# Patient Record
Sex: Female | Born: 1975
Health system: Southern US, Community
[De-identification: ages and names within clinical notes are randomized; demographics above are authoritative.]

## PROBLEM LIST (undated history)

## (undated) DIAGNOSIS — J189 Pneumonia, unspecified organism: Secondary | ICD-10-CM

## (undated) DIAGNOSIS — L709 Acne, unspecified: Secondary | ICD-10-CM

## (undated) DIAGNOSIS — R7303 Prediabetes: Secondary | ICD-10-CM

## (undated) DIAGNOSIS — I499 Cardiac arrhythmia, unspecified: Secondary | ICD-10-CM

## (undated) DIAGNOSIS — F419 Anxiety disorder, unspecified: Secondary | ICD-10-CM

## (undated) DIAGNOSIS — E785 Hyperlipidemia, unspecified: Secondary | ICD-10-CM

## (undated) DIAGNOSIS — J302 Other seasonal allergic rhinitis: Secondary | ICD-10-CM

## (undated) DIAGNOSIS — K219 Gastro-esophageal reflux disease without esophagitis: Secondary | ICD-10-CM

## (undated) DIAGNOSIS — R0600 Dyspnea, unspecified: Secondary | ICD-10-CM

## (undated) DIAGNOSIS — K921 Melena: Secondary | ICD-10-CM

## (undated) HISTORY — PX: CYST REMOVAL TRUNK: SHX6283

## (undated) HISTORY — DX: Gastro-esophageal reflux disease without esophagitis: K21.9

## (undated) HISTORY — PX: HEMORROIDECTOMY: SUR656

## (undated) HISTORY — PX: TONSILLECTOMY: SUR1361

## (undated) HISTORY — DX: Hyperlipidemia, unspecified: E78.5

## (undated) HISTORY — DX: Acne, unspecified: L70.9

## (undated) HISTORY — DX: Melena: K92.1

## (undated) HISTORY — DX: Other seasonal allergic rhinitis: J30.2

## (undated) HISTORY — PX: CHOLECYSTECTOMY: SHX55

---

## 2005-11-05 ENCOUNTER — Ambulatory Visit: Payer: Self-pay | Admitting: Internal Medicine

## 2005-11-12 ENCOUNTER — Ambulatory Visit: Payer: Self-pay | Admitting: Internal Medicine

## 2006-03-30 ENCOUNTER — Ambulatory Visit: Payer: Self-pay | Admitting: Internal Medicine

## 2006-04-03 ENCOUNTER — Ambulatory Visit: Payer: Self-pay | Admitting: Internal Medicine

## 2006-04-03 LAB — CONVERTED CEMR LAB
Albumin: 3.5 g/dL (ref 3.5–5.2)
Basophils Absolute: 0 10*3/uL (ref 0.0–0.1)
CO2: 25 meq/L (ref 19–32)
Chol/HDL Ratio, serum: 2.9
Cholesterol: 126 mg/dL (ref 0–200)
Creatinine, Ser: 0.8 mg/dL (ref 0.4–1.2)
Eosinophil percent: 3.5 % (ref 0.0–5.0)
Glomerular Filtration Rate, Af Am: 108 mL/min/{1.73_m2}
HCT: 35.8 % — ABNORMAL LOW (ref 36.0–46.0)
Hemoglobin: 12.1 g/dL (ref 12.0–15.0)
MCV: 85.8 fL (ref 78.0–100.0)
Monocytes Absolute: 0.4 10*3/uL (ref 0.2–0.7)
Neutrophils Relative %: 41 % — ABNORMAL LOW (ref 43.0–77.0)
Potassium: 3.9 meq/L (ref 3.5–5.1)
RBC: 4.18 M/uL (ref 3.87–5.11)
Rheumatoid Fact: <20 intl units/mL — ABNORMAL LOW (ref 0.0–20.0)
Sodium: 139 meq/L (ref 135–145)
TSH: 2.14 microintl units/mL (ref 0.35–5.50)
Total Bilirubin: 0.7 mg/dL (ref 0.3–1.2)
Total Protein: 7.1 g/dL (ref 6.0–8.3)
WBC: 6 10*3/uL (ref 4.5–10.5)

## 2006-04-29 ENCOUNTER — Ambulatory Visit: Payer: Self-pay | Admitting: Internal Medicine

## 2006-11-16 ENCOUNTER — Telehealth: Payer: Self-pay | Admitting: Internal Medicine

## 2006-11-25 ENCOUNTER — Telehealth (INDEPENDENT_AMBULATORY_CARE_PROVIDER_SITE_OTHER): Payer: Self-pay | Admitting: *Deleted

## 2006-12-15 DIAGNOSIS — K219 Gastro-esophageal reflux disease without esophagitis: Secondary | ICD-10-CM | POA: Insufficient documentation

## 2006-12-15 DIAGNOSIS — J45909 Unspecified asthma, uncomplicated: Secondary | ICD-10-CM | POA: Insufficient documentation

## 2006-12-15 DIAGNOSIS — J309 Allergic rhinitis, unspecified: Secondary | ICD-10-CM

## 2006-12-15 DIAGNOSIS — E785 Hyperlipidemia, unspecified: Secondary | ICD-10-CM

## 2007-05-19 ENCOUNTER — Ambulatory Visit: Payer: Self-pay | Admitting: Internal Medicine

## 2007-05-20 ENCOUNTER — Encounter: Payer: Self-pay | Admitting: Internal Medicine

## 2007-05-31 ENCOUNTER — Telehealth (INDEPENDENT_AMBULATORY_CARE_PROVIDER_SITE_OTHER): Payer: Self-pay | Admitting: *Deleted

## 2007-06-03 ENCOUNTER — Telehealth (INDEPENDENT_AMBULATORY_CARE_PROVIDER_SITE_OTHER): Payer: Self-pay | Admitting: *Deleted

## 2007-06-10 ENCOUNTER — Ambulatory Visit: Payer: Self-pay | Admitting: Internal Medicine

## 2007-06-21 ENCOUNTER — Telehealth: Payer: Self-pay | Admitting: Internal Medicine

## 2007-06-24 ENCOUNTER — Encounter: Payer: Self-pay | Admitting: Internal Medicine

## 2007-07-15 ENCOUNTER — Ambulatory Visit: Payer: Self-pay | Admitting: Internal Medicine

## 2007-07-15 DIAGNOSIS — K5289 Other specified noninfective gastroenteritis and colitis: Secondary | ICD-10-CM

## 2008-03-16 ENCOUNTER — Telehealth: Payer: Self-pay | Admitting: Internal Medicine

## 2008-04-24 ENCOUNTER — Encounter (INDEPENDENT_AMBULATORY_CARE_PROVIDER_SITE_OTHER): Payer: Self-pay | Admitting: *Deleted

## 2008-04-24 ENCOUNTER — Ambulatory Visit: Payer: Self-pay | Admitting: Family Medicine

## 2008-06-16 ENCOUNTER — Telehealth: Payer: Self-pay | Admitting: Internal Medicine

## 2008-08-14 ENCOUNTER — Encounter: Payer: Self-pay | Admitting: Family Medicine

## 2008-11-21 ENCOUNTER — Ambulatory Visit: Payer: Self-pay | Admitting: Family Medicine

## 2008-11-28 ENCOUNTER — Ambulatory Visit: Payer: Self-pay | Admitting: Family Medicine

## 2008-11-28 LAB — CONVERTED CEMR LAB
ALT: 14 units/L (ref 0–35)
AST: 19 units/L (ref 0–37)
Albumin: 3.4 g/dL — ABNORMAL LOW (ref 3.5–5.2)
Alkaline Phosphatase: 63 units/L (ref 39–117)
Basophils Absolute: 0 10*3/uL (ref 0.0–0.1)
CO2: 27 meq/L (ref 19–32)
Chloride: 109 meq/L (ref 96–112)
Glucose, Bld: 89 mg/dL (ref 70–99)
HCT: 36.9 % (ref 36.0–46.0)
Lymphocytes Relative: 45.6 % (ref 12.0–46.0)
Lymphs Abs: 4.1 10*3/uL — ABNORMAL HIGH (ref 0.7–4.0)
Monocytes Relative: 5.7 % (ref 3.0–12.0)
Platelets: 228 10*3/uL (ref 150.0–400.0)
Potassium: 4.3 meq/L (ref 3.5–5.1)
RDW: 12.3 % (ref 11.5–14.6)
Sodium: 143 meq/L (ref 135–145)
Total Protein: 7 g/dL (ref 6.0–8.3)

## 2008-11-29 ENCOUNTER — Telehealth (INDEPENDENT_AMBULATORY_CARE_PROVIDER_SITE_OTHER): Payer: Self-pay

## 2008-11-29 ENCOUNTER — Telehealth: Payer: Self-pay | Admitting: Family Medicine

## 2008-11-30 ENCOUNTER — Encounter: Payer: Self-pay | Admitting: Family Medicine

## 2008-12-04 LAB — CONVERTED CEMR LAB: Free T4: 0.9 ng/dL (ref 0.6–1.6)

## 2008-12-20 ENCOUNTER — Encounter: Payer: Self-pay | Admitting: Internal Medicine

## 2009-02-15 ENCOUNTER — Telehealth: Payer: Self-pay | Admitting: Family Medicine

## 2009-02-19 ENCOUNTER — Encounter (INDEPENDENT_AMBULATORY_CARE_PROVIDER_SITE_OTHER): Payer: Self-pay | Admitting: *Deleted

## 2009-04-26 ENCOUNTER — Telehealth: Payer: Self-pay | Admitting: Family Medicine

## 2009-10-01 ENCOUNTER — Telehealth: Payer: Self-pay | Admitting: Family Medicine

## 2009-10-29 ENCOUNTER — Telehealth: Payer: Self-pay | Admitting: Family Medicine

## 2009-11-07 ENCOUNTER — Ambulatory Visit: Payer: Self-pay | Admitting: Family Medicine

## 2009-11-08 ENCOUNTER — Ambulatory Visit: Payer: Self-pay | Admitting: Family Medicine

## 2009-11-08 ENCOUNTER — Telehealth (INDEPENDENT_AMBULATORY_CARE_PROVIDER_SITE_OTHER): Payer: Self-pay | Admitting: *Deleted

## 2009-11-09 ENCOUNTER — Telehealth: Payer: Self-pay | Admitting: Family Medicine

## 2010-02-14 ENCOUNTER — Ambulatory Visit: Payer: Self-pay | Admitting: Family Medicine

## 2010-02-14 LAB — CONVERTED CEMR LAB
AST: 29 units/L (ref 0–37)
Albumin: 3.5 g/dL (ref 3.5–5.2)
Alkaline Phosphatase: 99 units/L (ref 39–117)
Cholesterol: 146 mg/dL (ref 0–200)
Total CHOL/HDL Ratio: 2

## 2010-02-28 ENCOUNTER — Ambulatory Visit: Payer: Self-pay | Admitting: Family Medicine

## 2010-03-14 ENCOUNTER — Ambulatory Visit: Payer: Self-pay | Admitting: Internal Medicine

## 2010-03-14 ENCOUNTER — Telehealth: Payer: Self-pay | Admitting: Family Medicine

## 2010-03-14 DIAGNOSIS — J029 Acute pharyngitis, unspecified: Secondary | ICD-10-CM

## 2010-05-14 NOTE — Progress Notes (Signed)
  Phone Note Other Incoming   Caller: Mills Koller Summary of Call: FYI patient refused to let us draw blood, I tried once unsuccessfully, she came back today and let tasha try. She is has a great deal of anxiety about this. I sent her to [place] to the [employer]. Initial call taken by: Mills Koller,  November 08, 2009 8:53 AM  Follow-up for Phone Call        thank you. Ruthe Mannan MD  November 08, 2009 8:58 AM

## 2010-05-14 NOTE — Progress Notes (Signed)
Summary: Additional instructions  ---- Converted from flag ---- ---- 03/14/2010 12:03 PM, Tricia Boyden  MD wrote: can we call and remind abx will decrease effectiveness of OCP's i forgot to tell her. thanks. ------------------------------  Left detailed message on cell phone advising to use additional form of contraception while on abx. Instructed to call with questions or concerns. Kim Dance CMA Duncan Dull)  March 14, 2010 1:59 PM

## 2010-05-14 NOTE — Letter (Signed)
Summary: Generic Letter  [place] [facility]  [address] [place], [place] [address]   Phone: [phone]  Fax: [phone]    02/14/2010  [facility] [address] [place], [place]  [address]  Dear Ms. Beattie,    We have received your lab results and Dr. Dayton Martes says your LDL (bad cholesterol) is great, HDL (good cholesterol) is great as well.  Triglycerides still elevated.  Decrease added sugars, eliminate trans fats, increase fiber and limit alcohol.  All these changes together can drop triglycerides by almost 50%.  Liver function normal.       Sincerely,      Dr. Ruthe Mannan

## 2010-05-14 NOTE — Progress Notes (Signed)
Summary: prior Berkley Harvey is needed for singulair  Phone Note From Pharmacy   [facility]  [address] [id number] [employer] Summary of Call: Prior Berkley Harvey is needed for singulair, form is on your desk. Initial call taken by: Lowella Petties CMA,  October 01, 2009 3:28 PM

## 2010-05-14 NOTE — Assessment & Plan Note (Signed)
Summary: ST,COUGH,CONGESTION,ASTHMA/CLE   Vital Signs:  Patient profile:   35 year old female Weight:      258 pounds O2 Sat:      99 % on Room air Temp:     99.6 degrees F oral Pulse rate:   110 / minute Pulse rhythm:   regular BP sitting:   124 / 82  (left arm) Cuff size:   large  Vitals Entered By: Selena Batten Dance CMA (AAMA) (March 14, 2010 9:25 AM)  O2 Flow:  Room air CC: Sore throat/Cough/Congestion   History of Present Illness: CC: ST/congestion  1d h/o ST, sinus drainage, pain with swallowing.  Noticed uvula really swollen.  + congestion.  Has tried tylenol cold and alleve.  101 degree fever yesterday.  h/o asthma.  h/o allergies.  No cough, abd pain, n/v, rashes, myalgias, arthralgias.  Husband and son sick recently.  + sister with strep last week.  no smokers at home.  Current Medications (verified): 1)  Singulair 10 Mg  Tabs (Montelukast Sodium) .... One By Mouth At Bedtime 2)  Lipitor 20 Mg Tabs (Atorvastatin Calcium) .Marland Kitchen.. 1 By Mouth Once Daily 3)  Proventil Hfa 108 (90 Base) Mcg/act  Aers (Albuterol Sulfate) .... 2 Puffs Every 4 Hours As Needed 4)  Albuterol Sulfate (2.5 Mg/54ml) 0.083% Nebu (Albuterol Sulfate) .Marland Kitchen.. 1 Neb Q 4 Hours As Needed Wheezing 5)  Yasmin 28 3-0.03 Mg  Tabs (Drospirenone-Ethinyl Estradiol) .Marland Kitchen.. 1 Once Daily 6)  Zyrtec Allergy 10 Mg Tabs (Cetirizine Hcl) .... Take One Tablet Once Daily 7)  Tagamet .... As Needed 8)  Prilosec .... As Needed 9)  Flinstones Vitamin With Iron .... Take One Tablet Once Daily 10)  Nasonex 50 Mcg/act Susp (Mometasone Furoate) .... 2 Sprays Each Nostril Daily 11)  Align  Caps (Probiotic Product) .... Take One Tablet By Mouth Daily 12)  Phillips Stool Softener 100 Mg Caps (Docusate Sodium) .... Take Two Tablets At Bedtime 13)  Flovent Hfa 220 Mcg/act Aero (Fluticasone Propionate  Hfa) .... 2 Puffs Two Times A Day 14)  Dulera 100-5 Mcg/act Aero (Mometasone Furo-Formoterol Fum) .... 2 Inhalations Two Times A Day 15)   Valium 2 Mg Tabs (Diazepam) .Marland Kitchen.. 1-2 Tabs 30 Min Prior To Procedure  Allergies: 1)  ! Sulfa 2)  Sulfamethoxazole (Sulfamethoxazole)  Past History:  Past Medical History: Last updated: 04/24/2008 Acne Allergic rhinitis Asthma Hyperlipidemia Blood in Stool Chronic Bronchitis Allergies GERD  Social History: Last updated: 12/15/2006 Occupation: Married Never Smoked Alcohol use-no Drug use-no Regular exercise-no  Review of Systems       per HPI  Physical Exam  General:  alert and overweight-appearing. nontoxic, + congested  Head:  Normocephalic and atraumatic without obvious abnormalities. No apparent alopecia or balding.  NT sinuses Eyes:  No corneal or conjunctival inflammation noted. EOMI. Perrla.  Ears:  fluid behind bilateral TMs, L side somewhat erythematous Nose:  nares clear Mouth:  MMM, + significant pharyngeal erythema, some exudates, + uvula erythematous and slightly swollen Neck:  R AC LAD Lungs:  normal respiratory effort, no intercostal retractions, no accessory muscle use, and no wheezes.   Heart:  normal rate and regular rhythm.   Pulses:  2+ rad pulses Extremities:  no pedal edema Skin:  Intact without suspicious lesions or rashes   Impression & Recommendations:  Problem # 1:  ACUTE PHARYNGITIS (ICD-462)  3-4/4 centor criteria.  rapid strep neg.  given swollen uvula, treat with abx.  lungs clear today.  return for red flags.  Orders:  Rapid Strep (32202)  Her updated medication list for this problem includes:    Amoxicillin 500 Mg Caps (Amoxicillin) .Marland Kitchen... Take one twice daily for next 10 days  Complete Medication List: 1)  Singulair 10 Mg Tabs (Montelukast sodium) .... One by mouth at bedtime 2)  Lipitor 20 Mg Tabs (Atorvastatin calcium) .Marland Kitchen.. 1 by mouth once daily 3)  Proventil Hfa 108 (90 Base) Mcg/act Aers (Albuterol sulfate) .... 2 puffs every 4 hours as needed 4)  Albuterol Sulfate (2.5 Mg/47ml) 0.083% Nebu (Albuterol sulfate) .Marland Kitchen.. 1 neb q  4 hours as needed wheezing 5)  Yasmin 28 3-0.03 Mg Tabs (Drospirenone-ethinyl estradiol) .Marland Kitchen.. 1 once daily 6)  Zyrtec Allergy 10 Mg Tabs (Cetirizine hcl) .... Take one tablet once daily 7)  Tagamet  .... As needed 8)  Prilosec  .... As needed 9)  Flinstones Vitamin With Iron  .... Take one tablet once daily 10)  Nasonex 50 Mcg/act Susp (Mometasone furoate) .... 2 sprays each nostril daily 11)  Align Caps (Probiotic product) .... Take one tablet by mouth daily 12)  Phillips Stool Softener 100 Mg Caps (Docusate sodium) .... Take two tablets at bedtime 13)  Flovent Hfa 220 Mcg/act Aero (Fluticasone propionate  hfa) .... 2 puffs two times a day 14)  Dulera 100-5 Mcg/act Aero (Mometasone furo-formoterol fum) .... 2 inhalations two times a day 15)  Valium 2 Mg Tabs (Diazepam) .Marland Kitchen.. 1-2 tabs 30 min prior to procedure 16)  Amoxicillin 500 Mg Caps (Amoxicillin) .... Take one twice daily for next 10 days  Patient Instructions: 1)  could be viral, could be strep.  Treat with amoxicillin twice daily for next 10 days. 2)  Wash hands to prevent spreading. 3)  Push fluids, get plenty of rest, ibuprofen up to 600mg  three times a day with food (motrin) for sore throat.  Suck on cold things like popsicles, or heat to soothe throat like herbal teas, consider salt water gargles. 4)  If fever >101.5, trouble swallowing or breathing or opening mouth, drooling, not improving as expected, or other concerns, you may need to return to be seen. 5)  Pleasure to see you today, call clinic with questions.  Prescriptions: AMOXICILLIN 500 MG CAPS (AMOXICILLIN) take one twice daily for next 10 days  #20 x 0   Entered and Authorized by:   Eustaquio Boyden  MD   Signed by:   Eustaquio Boyden  MD on 03/14/2010   Method used:   [facility]        [facility]  [address] [id number]       [address]       [place], [place]  [address]       Ph: [phone] or [phone]       Fax: [phone]   RxID:    [id number]    Orders Added: 1)  Rapid Strep [67893] 2)  Est. Patient Level III [81017]    Current Allergies (reviewed today): ! SULFA SULFAMETHOXAZOLE (SULFAMETHOXAZOLE)  Laboratory Results    Other Tests  Rapid Strep: negative

## 2010-05-14 NOTE — Assessment & Plan Note (Signed)
Summary: F/U LABWORK/CLE   Vital Signs:  Patient profile:   35 year old female Height:      63 inches Weight:      254 pounds BMI:     45.16 Temp:     99.1 degrees F oral Pulse rate:   80 / minute Pulse rhythm:   regular BP sitting:   128 / 82  (right arm) Cuff size:   large  Vitals Entered By: Linde Gillis CMA Duncan Dull) (February 28, 2010 8:06 AM) CC: follow up after labs   History of Present Illness: 35 year old female here to follow up labs.  Asthma and allergies: not well controlled.  Flovent did not work for her.  Wants to try something else.    Hyperlipidemia- had been on  Lipitor 20 mg and Tricor 145 mg and has been for years.  When first started on Tricor her TG were above 1000 but was on accutance at the time.  Last lipid panel was in August and very good.  Father had MI in his 18s.  Tricor is extremely expensive so she wanted to stop taking it. Lipid panel this month showed TG increased to 256, HDL 64, and LDL 50.  Working on diet.        Current Medications (verified): 1)  Singulair 10 Mg  Tabs (Montelukast Sodium) .... One By Mouth At Bedtime 2)  Lipitor 20 Mg Tabs (Atorvastatin Calcium) .Marland Kitchen.. 1 By Mouth Once Daily 3)  Proventil Hfa 108 (90 Base) Mcg/act  Aers (Albuterol Sulfate) .... 2 Puffs Every 4 Hours As Needed 4)  Albuterol Sulfate (2.5 Mg/68ml) 0.083% Nebu (Albuterol Sulfate) .Marland Kitchen.. 1 Neb Q 4 Hours As Needed Wheezing 5)  Yasmin 28 3-0.03 Mg  Tabs (Drospirenone-Ethinyl Estradiol) .Marland Kitchen.. 1 Once Daily 6)  Zyrtec Allergy 10 Mg Tabs (Cetirizine Hcl) .... Take One Tablet Once Daily 7)  Tagamet .... As Needed 8)  Prilosec .... As Needed 9)  Flinstones Vitamin With Iron .... Take One Tablet Once Daily 10)  Nasonex 50 Mcg/act Susp (Mometasone Furoate) .... 2 Sprays Each Nostril Daily 11)  Align  Caps (Probiotic Product) .... Take One Tablet By Mouth Daily 12)  Phillips Stool Softener 100 Mg Caps (Docusate Sodium) .... Take Two Tablets At Bedtime 13)  Flovent Hfa 220  Mcg/act Aero (Fluticasone Propionate  Hfa) .... 2 Puffs Two Times A Day 14)  Dulera 100-5 Mcg/act Aero (Mometasone Furo-Formoterol Fum) .... 2 Inhalations Two Times A Day 15)  Valium 2 Mg Tabs (Diazepam) .Marland Kitchen.. 1-2 Tabs 30 Min Prior To Procedure  Allergies: 1)  ! Sulfa 2)  Sulfamethoxazole (Sulfamethoxazole)  Past History:  Past Medical History: Last updated: 04/24/2008 Acne Allergic rhinitis Asthma Hyperlipidemia Blood in Stool Chronic Bronchitis Allergies GERD  Past Surgical History: Last updated: 12/15/2006 Cholecystectomy Tonsillectomy Hemorrhoidectomy  Family History: Last updated: 12/15/2006 Family History of Arthritis Family History Diabetes 1st degree relative Family History Hypertension Family History of Stroke M 1st degree relative <50 Family History of Cardiovascular disorder  Social History: Last updated: 12/15/2006 Occupation: Married Never Smoked Alcohol use-no Drug use-no Regular exercise-no  Risk Factors: Exercise: no (12/15/2006)  Risk Factors: Smoking Status: never (12/15/2006)  Review of Systems      See HPI General:  Denies chills and fever. CV:  Denies chest pain or discomfort. Resp:  Denies shortness of breath. MS:  Denies muscle aches and muscle weakness.  Physical Exam  General:  alert and overweight-appearing.   Lungs:  normal respiratory effort, no intercostal retractions, no accessory  muscle use, and no wheezes.   Heart:  normal rate and regular rhythm.   Psych:  Cognition and judgment appear intact. Alert and cooperative with normal attention span and concentration. No apparent delusions, illusions, hallucinations   Impression & Recommendations:  Problem # 1:  HYPERLIPIDEMIA (ICD-272.4) Assessment Deteriorated Will continue lipitor but we needed to monitor her TG very closely.   She needs to continue diet: Decreased added sugars, eliminate trans fats, increase fiber and limit alcohol.  All these changes together can drop  triglycerides by almost 50%.  Her updated medication list for this problem includes:    Lipitor 20 Mg Tabs (Atorvastatin calcium) .Marland Kitchen... 1 by mouth once daily  Problem # 2:  ASTHMA (ICD-493.90) Assessment: Unchanged Will try Dulera.  Her updated medication list for this problem includes:    Singulair 10 Mg Tabs (Montelukast sodium) ..... One by mouth at bedtime    Proventil Hfa 108 (90 Base) Mcg/act Aers (Albuterol sulfate) .Marland Kitchen... 2 puffs every 4 hours as needed    Albuterol Sulfate (2.5 Mg/2ml) 0.083% Nebu (Albuterol sulfate) .Marland Kitchen... 1 neb q 4 hours as needed wheezing    Flovent Hfa 220 Mcg/act Aero (Fluticasone propionate  hfa) .Marland Kitchen... 2 puffs two times a day    Dulera 100-5 Mcg/act Aero (Mometasone furo-formoterol fum) .Marland Kitchen... 2 inhalations two times a day  Complete Medication List: 1)  Singulair 10 Mg Tabs (Montelukast sodium) .... One by mouth at bedtime 2)  Lipitor 20 Mg Tabs (Atorvastatin calcium) .Marland Kitchen.. 1 by mouth once daily 3)  Proventil Hfa 108 (90 Base) Mcg/act Aers (Albuterol sulfate) .... 2 puffs every 4 hours as needed 4)  Albuterol Sulfate (2.5 Mg/75ml) 0.083% Nebu (Albuterol sulfate) .Marland Kitchen.. 1 neb q 4 hours as needed wheezing 5)  Yasmin 28 3-0.03 Mg Tabs (Drospirenone-ethinyl estradiol) .Marland Kitchen.. 1 once daily 6)  Zyrtec Allergy 10 Mg Tabs (Cetirizine hcl) .... Take one tablet once daily 7)  Tagamet  .... As needed 8)  Prilosec  .... As needed 9)  Flinstones Vitamin With Iron  .... Take one tablet once daily 10)  Nasonex 50 Mcg/act Susp (Mometasone furoate) .... 2 sprays each nostril daily 11)  Align Caps (Probiotic product) .... Take one tablet by mouth daily 12)  Phillips Stool Softener 100 Mg Caps (Docusate sodium) .... Take two tablets at bedtime 13)  Flovent Hfa 220 Mcg/act Aero (Fluticasone propionate  hfa) .... 2 puffs two times a day 14)  Dulera 100-5 Mcg/act Aero (Mometasone furo-formoterol fum) .... 2 inhalations two times a day 15)  Valium 2 Mg Tabs (Diazepam) .Marland Kitchen.. 1-2 tabs 30  min prior to procedure  Patient Instructions: 1)  Please come back to see me in 3 months for lab work. Prescriptions: VALIUM 2 MG TABS (DIAZEPAM) 1-2 tabs 30 min prior to procedure  #20 x 0   Entered and Authorized by:   Ruthe Mannan MD   Signed by:   Ruthe Mannan MD on 02/28/2010   Method used:   Print then Give to Patient   RxID:   [id number]    Orders Added: 1)  Est. Patient Level IV [14782]    Current Allergies (reviewed today): ! SULFA SULFAMETHOXAZOLE (SULFAMETHOXAZOLE)

## 2010-05-14 NOTE — Assessment & Plan Note (Signed)
Summary: XFER FROM DR COPLAND-   Vital Signs:  Patient profile:   35 year old female Height:      63 inches Weight:      249.38 pounds BMI:     44.34 Temp:     98.7 degrees F oral Pulse rate:   88 / minute Pulse rhythm:   regular BP sitting:   120 / 82  (right arm) Cuff size:   large  Vitals Entered By: Linde Gillis CMA [facility]) (November 07, 2009 8:10 AM) CC:  transfer from Dr. Patsy Lager   History of Present Illness: 35 year old female here to establish care with me and discuss changing her medications.  Asthma and allergies: not well controlled.  Using her nebulizer daily and not using her Qvar because it is too expensive.  Has issues with temp and humidity changes which is much worse since she moved here from [place].  Hyperlipidemia- on Lipitor 20 mg and Tricor 145 mg and has been for years.  When first started on Tricor her TG were above 1000 but was on accutance at the time.  Last lipid panel was in August and very good.  Father had MI in his 9s.  Tricor is extremely expensive.      Current Medications (verified): 1)  Singulair 10 Mg  Tabs (Montelukast Sodium) .... One By Mouth At Bedtime 2)  Lipitor 20 Mg Tabs (Atorvastatin Calcium) .Marland Kitchen.. 1 By Mouth Once Daily 3)  Proventil Hfa 108 (90 Base) Mcg/act  Aers (Albuterol Sulfate) .... 2 Puffs Every 4 Hours As Needed 4)  Albuterol Sulfate (2.5 Mg/41ml) 0.083% Nebu (Albuterol Sulfate) .Marland Kitchen.. 1 Neb Q 4 Hours As Needed Wheezing 5)  Yasmin 28 3-0.03 Mg  Tabs (Drospirenone-Ethinyl Estradiol) .Marland Kitchen.. 1 Once Daily 6)  Zyrtec Allergy 10 Mg Tabs (Cetirizine Hcl) .... Take One Tablet Once Daily 7)  Tagamet .... As Needed 8)  Prilosec .... As Needed 9)  Flinstones Vitamin With Iron .... Take One Tablet Once Daily 10)  Nasonex 50 Mcg/act Susp (Mometasone Furoate) .... 2 Sprays Each Nostril Daily 11)  Align  Caps (Probiotic Product) .... Take One Tablet By Mouth Daily 12)  Phillips Stool Softener 100 Mg Caps (Docusate Sodium) .... Take Two Tablets  At Bedtime 13)  Flovent Hfa 220 Mcg/act Aero (Fluticasone Propionate  Hfa) .... 2 Puffs Two Times A Day  Allergies: 1)  ! Sulfa 2)  Sulfamethoxazole (Sulfamethoxazole)  Past History:  Past Medical History: Last updated: 04/24/2008 Acne Allergic rhinitis Asthma Hyperlipidemia Blood in Stool Chronic Bronchitis Allergies GERD  Past Surgical History: Last updated: 12/15/2006 Cholecystectomy Tonsillectomy Hemorrhoidectomy  Family History: Last updated: 12/15/2006 Family History of Arthritis Family History Diabetes 1st degree relative Family History Hypertension Family History of Stroke M 1st degree relative <50 Family History of Cardiovascular disorder  Social History: Last updated: 12/15/2006 Occupation: Married Never Smoked Alcohol use-no Drug use-no Regular exercise-no  Risk Factors: Exercise: no (12/15/2006)  Risk Factors: Smoking Status: never (12/15/2006)  Review of Systems      See HPI  Physical Exam  General:  alert and overweight-appearing.   Psych:  Cognition and judgment appear intact. Alert and cooperative with normal attention span and concentration. No apparent delusions, illusions, hallucinations   Impression & Recommendations:  Problem # 1:  HYPERLIPIDEMIA (ICD-272.4) Assessment Unchanged Time spent with patient 25 minutes, more than 50% of this time was spent counseling patient on HLD and asthma management. Will recheck FLP today and plan to hold Tricor.  Discussed dietary changes that can  improve TG (see pt instructions).  Needs cardiac protection from statin, so continue Lipitor.  We can always increase lipitor dose or change to Crestor if lipids increase at next office visit (1-2 months).  The following medications were removed from the medication list:    Tricor 145 Mg Tabs (Fenofibrate) .Marland Kitchen... Take one tablet daily Her updated medication list for this problem includes:    Lipitor 20 Mg Tabs (Atorvastatin calcium) .Marland Kitchen... 1 by mouth  once daily  Orders: TLB-Lipid Panel ([id number]-[id number])  Problem # 2:  ASTHMA (ICD-493.90) Assessment: Deteriorated Needs a daily inhaled steroid at this point.  Will try Flovent as it is cheaper than Qvar so will hopefully improve patient compliance. The following medications were removed from the medication list:    Qvar 80 Mcg/act Aers (Beclomethasone dipropionate) .Marland Kitchen... 2 puffs two times a day Her updated medication list for this problem includes:    Singulair 10 Mg Tabs (Montelukast sodium) ..... One by mouth at bedtime    Proventil Hfa 108 (90 Base) Mcg/act Aers (Albuterol sulfate) .Marland Kitchen... 2 puffs every 4 hours as needed    Albuterol Sulfate (2.5 Mg/108ml) 0.083% Nebu (Albuterol sulfate) .Marland Kitchen... 1 neb q 4 hours as needed wheezing    Flovent Hfa 220 Mcg/act Aero (Fluticasone propionate  hfa) .Marland Kitchen... 2 puffs two times a day  Complete Medication List: 1)  Singulair 10 Mg Tabs (Montelukast sodium) .... One by mouth at bedtime 2)  Lipitor 20 Mg Tabs (Atorvastatin calcium) .Marland Kitchen.. 1 by mouth once daily 3)  Proventil Hfa 108 (90 Base) Mcg/act Aers (Albuterol sulfate) .... 2 puffs every 4 hours as needed 4)  Albuterol Sulfate (2.5 Mg/27ml) 0.083% Nebu (Albuterol sulfate) .Marland Kitchen.. 1 neb q 4 hours as needed wheezing 5)  Yasmin 28 3-0.03 Mg Tabs (Drospirenone-ethinyl estradiol) .Marland Kitchen.. 1 once daily 6)  Zyrtec Allergy 10 Mg Tabs (Cetirizine hcl) .... Take one tablet once daily 7)  Tagamet  .... As needed 8)  Prilosec  .... As needed 9)  Flinstones Vitamin With Iron  .... Take one tablet once daily 10)  Nasonex 50 Mcg/act Susp (Mometasone furoate) .... 2 sprays each nostril daily 11)  Align Caps (Probiotic product) .... Take one tablet by mouth daily 12)  Phillips Stool Softener 100 Mg Caps (Docusate sodium) .... Take two tablets at bedtime 13)  Flovent Hfa 220 Mcg/act Aero (Fluticasone propionate  hfa) .... 2 puffs two times a day  Other Orders: TLB-Hepatic/Liver Function Pnl ([id number])  Patient  Instructions: 1)  Please stop taking Tricor. 2)  Continue Lipitor at current dose. 3)  Try flovent. 4)  Make a fasting appointment to come see me in 1-2 months. 5)   Decrease added sugars, eliminate trans fats, increase fiber and limit alcohol.  All these changes together can drop triglycerides by almost 50%. Prescriptions: NASONEX 50 MCG/ACT SUSP (MOMETASONE FUROATE) 2 sprays each nostril daily  #1 x 11   Entered and Authorized by:   Ruthe Mannan MD   Signed by:   Ruthe Mannan MD on 11/07/2009   Method used:   [facility]        [facility] (retail)       [address]       [place], [place]  [address]       Ph: [phone] or [phone]       Fax: [phone]   RxID:   [id number] YASMIN 28 3-0.03 MG  TABS (DROSPIRENONE-ETHINYL ESTRADIOL) 1 once daily  #90 x 3   Entered and Authorized  by:   Ruthe Mannan MD   Signed by:   Ruthe Mannan MD on 11/07/2009   Method used:   [facility]        [id number] ([facility]ail[facility]       [address]       [place], [place]  [address]       Ph: [phone] or [phone]       Fax: [phone]   RxID:   [id number] ALBUTEROL SULFATE (2.5 MG/3ML) 0.083% NEBU (ALBUTEROL SULFATE) 1 neb q 4 hours as needed wheezing  #40 x 5   Entered and Authorized by:   Ruthe Mannan MD   Signed by:   Ruthe Mannan MD on 11/07/2009   Method used:   [facility]        [facility] (retail)       [address]       [place], [place]  [address]       Ph: [phone] or [phone]       Fax: [phone]   RxID:   [id number] PROVENTIL HFA 108 (90 BASE) MCG/ACT  AERS (ALBUTEROL SULFATE) 2 puffs every 4 hours as needed  #3 x 0   Entered and Authorized by:   Ruthe Mannan MD   Signed by:   Ruthe Mannan MD on 11/07/2009   Method used:   [facility]        [facility] (retail)       [address]       [place], [place]  [address]       Ph: [phone] or [phone]       Fax:  [phone]   RxID:   [id number] LIPITOR 20 MG TABS (ATORVASTATIN CALCIUM) 1 by mouth once daily Brand medically necessary #90 x 3   Entered and Authorized by:   Ruthe Mannan MD   Signed by:   Ruthe Mannan MD on 11/07/2009   Method used:   [facility]        [facility] (retail)       [address]       [place], [place]  [address]       Ph: [phone] or [phone]       Fax: [phone]   RxID:   [id number] SINGULAIR 10 MG  TABS (MONTELUKAST SODIUM) One by mouth at bedtime  #90 x 3   Entered and Authorized by:   Ruthe Mannan MD   Signed by:   Ruthe Mannan MD on 11/07/2009   Method used:   [facility]        [facility] (retail)       [address]       [place], [place]  [address]       Ph: [phone] or [phone]       Fax: [phone]   RxID:   [id number] FLOVENT HFA 220 MCG/ACT AERO (FLUTICASONE PROPIONATE  HFA) 2 puffs two times a day  #1 x 3   Entered and Authorized by:   Ruthe Mannan MD   Signed by:   Ruthe Mannan MD on 11/07/2009   Method used:   [facility]        [facility] (retail)       [address]       [place], [place]  [address]       Ph: [phone] or [phone]       Fax: [phone]  RxID:   [id number]   Current Allergies (reviewed today): ! SULFA SULFAMETHOXAZOLE (SULFAMETHOXAZOLE)   Prevention & Chronic Care Immunizations   Influenza vaccine: Not documented    Tetanus booster: Not documented    Pneumococcal vaccine: Not documented  Other Screening   Pap smear: Not documented   Smoking status: never  (12/15/2006)  Lipids   Total Cholesterol: 130  (11/28/2008)   Lipid panel action/deferral: Lipid Panel ordered   LDL: 50  (11/28/2008)   LDL Direct: Not documented   HDL: 50.00  (11/28/2008)   Triglycerides: 149.0  (11/28/2008)    SGOT (AST): 19  (11/28/2008)   BMP action: Ordered   SGPT (ALT): 14  (11/28/2008)   Alkaline phosphatase: 63   (11/28/2008)   Total bilirubin: 0.6  (11/28/2008)    Lipid flowsheet reviewed?: Yes   Progress toward LDL goal: At goal  Self-Management Support :    Lipid self-management support: Not documented

## 2010-05-14 NOTE — Progress Notes (Signed)
Summary: WANTS TO XFER PHYSICIANS   Phone Note Call from Patient   Caller: Patient Call For: [phone] Summary of Call: Pt request to transfer from a female physician to female.  Request Dr. Dayton Martes.Marland KitchenMarland KitchenDaine Gip  October 29, 2009 9:34 AM  Initial call taken by: Daine Gip,  October 29, 2009 9:34 AM  Follow-up for Phone Call        ok with me if ok with Briella Hobday. Ruthe Mannan MD  October 29, 2009 9:50 AM   Additional Follow-up for Phone Call Additional follow up Details #1::        OK i think i have only seen her once Additional Follow-up by: Hannah Beat MD,  October 29, 2009 9:52 AM

## 2010-05-14 NOTE — Progress Notes (Signed)
Summary: Rx Albuterol  Phone Note Refill Request Call back at [phone] Message from:  [facility] on October 29, 2009 12:18 PM  Refills Requested: Medication #1:  ALBUTEROL SULFATE (2.5 MG/3ML) 0.083% NEBU 1 neb q 4 hours as needed wheezing   Last Refilled: 04/24/2008  Method Requested: Electronic Initial call taken by: Sydell Axon LPN,  October 29, 2009 12:20 PM    Prescriptions: ALBUTEROL SULFATE (2.5 MG/3ML) 0.083% NEBU (ALBUTEROL SULFATE) 1 neb q 4 hours as needed wheezing  #40 x 5   Entered by:   Ruthe Mannan MD   Authorized by:   Hannah Beat MD   Signed by:   Ruthe Mannan MD on 10/29/2009   Method used:   [facility]        [facility] ([facility]ail)       [address]       [place], [place]  [address]       Ph: [phone] or [phone]       Fax: [phone]   RxID:   [id number]

## 2010-05-14 NOTE — Progress Notes (Signed)
Summary: regarding blood work  Phone Note Call from Patient Call back at [employer] [phone] Call back at [phone]   Caller: Patient Call For: Ruthe Mannan MD Summary of Call: Pt was told at the last visit that she needed to have lipids drawn.  She has been here for the last 2 mornings and we have not been able to get her blood.  She is willing to go to [facility] for this next week, unless you think she really doesnt have to get it done. Initial call taken by: Lowella Petties CMA,  November 09, 2009 8:09 AM  Follow-up for Phone Call        She does need it since we agreed to stop her Tricor.  If she does not want to stop her Tricor, then we do not need labs. Ruthe Mannan MD  November 09, 2009 8:44 AM  [facility] for pt to call.           Lowella Petties CMA  November 09, 2009 9:33 AM I have left several messages for pt to call back, including one on saturday and again today.             Lowella Petties CMA  November 13, 2009 2:48 PM  Spoke with patient and advised as instructed via telephone.  She would like to go to [facility] to have labs drawn and needs an order.  Please write order and I will mail to patient.  Linde Gillis CMA [facility]ull)  November 14, 2009 12:22 PM   Additional Follow-up for Phone Call Additional follow up Details #1::        In my box. Ruthe Mannan MD  November 15, 2009 7:58 AM  Order mailed to patient's home address. Additional Follow-up by: Linde Gillis CMA [employer]),  November 15, 2009 9:05 AM

## 2010-05-14 NOTE — Progress Notes (Signed)
Summary: Rx Yasmin  Phone Note Refill Request Call back at [phone] Message from:  [facility] on April 26, 2009 9:17 AM  Refills Requested: Medication #1:  YASMIN 28 3-0.03 MG  TABS 1 once daily Received faxed refill request, patient last seen 11/21/2008, patient has not had a physical.  Called and left message on voicemail for her to call back and schedule an appointment.  Please advise   Method Requested: Electronic Initial call taken by: Linde Gillis CMA [facility] Dull),  April 26, 2009 9:23 AM  Follow-up for Phone Call        At 33 should have yearly physical, but OK to refill her birth control. Follow-up by: Hannah Beat MD,  April 26, 2009 9:34 AM  Additional Follow-up for Phone Call Additional follow up Details #1::        Rx called to pharmacy, patient notified, she cannot tolerate generic Yasmin so I advised the pharmacist.  She will call back to schedule a physical. Additional Follow-up by: Linde Gillis CMA [facility] Dull),  April 26, 2009 2:33 PM    Prescriptions: YASMIN 28 3-0.03 MG  TABS (DROSPIRENONE-ETHINYL ESTRADIOL) 1 once daily  #90 x 3   Entered and Authorized by:   Hannah Beat MD   Signed by:   Hannah Beat MD on 04/26/2009   Method used:   Faxed to ...       [facility])             , [place]         Ph: [phone]       Fax: [phone]   RxID:   [id number] YASMIN 28 3-0.03 MG  TABS (DROSPIRENONE-ETHINYL ESTRADIOL) 1 once daily  #90 x 3   Entered and Authorized by:   Hannah Beat MD   Signed by:   Hannah Beat MD on 04/26/2009   Method used:   Telephoned to ...       [facility] ([facility]ail)       [address]       [place], [place]  [address]       Ph: [phone] or [phone]       Fax: [phone]   RxID:   [id number]

## 2010-06-03 ENCOUNTER — Ambulatory Visit: Payer: Self-pay | Admitting: Family Medicine

## 2010-06-03 DIAGNOSIS — Z0289 Encounter for other administrative examinations: Secondary | ICD-10-CM

## 2010-07-31 ENCOUNTER — Other Ambulatory Visit: Payer: Self-pay | Admitting: *Deleted

## 2010-07-31 MED ORDER — DROSPIRENONE-ETHINYL ESTRADIOL 3-0.03 MG PO TABS: 1.0000 | ORAL_TABLET | Freq: Every day | ORAL | Status: DC

## 2010-08-30 NOTE — Assessment & Plan Note (Signed)
[facility]                              [facility] OFFICE NOTE   NAME:[name]                       MRN:          [id number]  DATE:11/12/2005                            DOB:          Feb 19, 1976    CHIEF COMPLAINT:  New patient, discuss medications and number of issues.   HISTORY OF PRESENT ILLNESS:  [name] is a 35 year old, nonsmoking,  married, white female who comes in today for a first time visit.  Her  previous medical care was in [place], about 6 months ago from when she  moved for her husband's job.  Although she was originally from [place], she has been out of the state for a while.  She has a list of a  number of things to address today.   PROBLEM LIST:  1.  Allergic rhinitis and asthma.  She had a history since age 51 of these      difficulties and allergic to many different things including cats and      trees, although she has a hypoallergenic dog.  Her allergies were      somewhat controlled in [place] but since she has been down here, she      has been having difficulty with this.  She does not like to take      medicines on a daily basis and therefore, is not taking controller      medicines regularly now, however, is pretty much using Benadryl and      Sudafed each night to sleep because of her nasal congestion being      significant.  She has some itchy eyes, stuffy nose for which she is      taking Nasonex sort of p.r.n. and has been using her albuterol more than      she should recently 2-3 times a week but having no acute asthma attack      symptoms.  They have air-conditioning in their apartment and some of the      symptoms do seem to be worse, even though they have a dehumidifier.      Medications, the only controller medicine she is on currently is      Singulair and has used Advair, albuterol, Nasonex, Patanol in the past.      Needs refill for all medicines including a spacer.  2.  Lipids.  She has had  a history of hyperlipidemia and although I do not      have her records review today, although they were sent, was put on      Lipitor and TriCor for this, and her cholesterol levels have been      controlled over the last 6 months or more.  Her previous doctor was      planning, after she got established, to recommend going off one of the      medications if things were continuing to be well.  She has a history of mild pancreatitis that was secondary to a bile leak  status post what sounds like ERCP and gallbladder surgery in December 2005.  She did say her triglycerides were in the thousands in the past, although on  further questioning it did not sound she was highly grilled or drilled in or  educated in lifestyle changes to control these numbers.  She is anxious to  get off some of the medicines, although she has no side effects from them at  the present.  1.  She has questions about calcium and vitamins with her medications      because her mother had osteoporosis at 38s whether she should be taking      calcium supplements.  2.  She has some urinary frequency and some nocturia that is not associated      with pain and fever, wonders if her uterus could be dropping.  She does      have an appointment with her GYN, is on Yasmin at present for      contraception.   PAST MEDICAL HISTORY:  See database.  1.  Tonsillectomy, 1997.  2.  Cholecystectomy, March 31, 2004.  Five-day hospitalization post-op      with a bile leak and mild pancreatitis.  3.  Hemorrhoid removal, July 2006.  4.  Pilonidal cyst removal, 1994.  5.  Seasonal rhinitis.  6.  GERD.  7.  Asthma.  8.  Chicken pox.  9.  Recurrent bronchitis related to her asthma, in the past.  10. Last Pap one year ago.  11. She is primiparous.  12. LMP, October 19, 2005.  13. Tetanus shot, March 2007.   MEDICATIONS:  1.  Yasmin 1 p.o. every day.  2.  TriCor 145 a day.  3.  Singular 10 mg a day.  4.  Lipitor 20 mg a day.  5.   __________  0.1% at night.   DRUG ALLERGIES:  SULFA.   AS NEEDED MEDICINES:  That she has used:  1.  Advair Diskus 250/50.  2.  Betamethasone 0.05 cream.  3.  Albuterol 90 mcg or Proventil HFA.  4.  Nasonex 50 mcg spray.  5.  Patanol 0.1% eye drops.   OVER-THE-COUNTER MEDICATIONS:  1.  Benadryl.  2.  Sudafed.  3.  Tylenol.  Often or as needed.   FAMILY HISTORY:  Father had a heart attack in his 70s, does not know what  the lipids were doing, hypertension also.  Grandparents generation, both  sides had diabetes, heart attack, stroke, and arthritis.  As above, Mom has  osteoporosis.   SOCIAL HISTORY:  Household of two.  Pet dog.  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  Negative  tobacco, alcohol.  No caffeine every day but does drink carbonated  beverages, diet sodas.  Wants to go back to exercise.  Eats out regularly,  is not a vegetarian.   REVIEW OF SYSTEMS:  Negative for chest pain, shortness of breath. GI/GU:  As  above.  PULMONARY:  As above.  Rest of ROS negative or noncontributory  today.   OBJECTIVE:  VITAL SIGNS:  Height 5 foot 3 inches, weight 232, pulse 70 and  regular, blood pressure 110/60.  GENERAL:  This is a well-developed, well-nourished, healthy-appearing, young  adult in no acute distress with some mild nasal congestion.  HEENT:  Normocephalic.  TMs clear.  Eyes:  Sclerae are nonicteric.  Minimally irritated.  No discharge noted.  She is wearing glasses.  Nares +1  to 2 turbinates.  No discharge is seen.  Face is nontender.  OP clear except  for some cobblestoning.  NECK:  Without masses, thyromegaly, or bruits.  CHEST:  CTA.  BS equal.  CARDIAC:  S1 S2.  No gallops or murmurs.  EXTREMITIES:  Peripheral pulses present.  Negative CCE.  NEUROLOGIC:  Grossly intact.  SKIN:  No acute acne at present, some scarring noted.  LABORATORY:  Done July 2007, shows a normal CBC.  Total cholesterol is 137,   triglycerides 140, HDL 52, LDL 57.  CMP normal except for albumin of 3.3.  TSH of 2.74.   IMPRESSION:  1.  Hyperlipidemia with hypertriglyceridemia by history.  We will review the      records.  With her increased BMI and family history, this is significant      and would be hesitant to stop the medication today without other      lifestyle further intervention but I would like to look at the records      first.  Although she has hypertriglyceridemia, I would probably stop the      TriCor first and strongly recommended a program similar to Weight      Watchers to get her body mass index down with appropriate caloric      restriction and exercise and she is willing to do this and interested in      this today.  2.  Allergic rhinitis.  Not on all the controller medicines.  Needs to      restart her Nasonex and try Zyrtec as this has been really helpful in      the past but cost was an issue.  And, stay on her Singulair.  Can      restart her Advair and then back off as needed.  If not improving, then      we will approach this differently.  3.  Acne.  Medications will be refill by her dermatologist.  4.  Urinary symptoms.  These may be bladder irritability, certainly not      diabetes but I would recommend she minimize her carbonated beverages and      when she checks with her gynecologist they can comment on her symptoms      also.   A handout given today on calcium.  Refilled all her medications.  Handout on  diet/exercise, low fat eating.  Spent more than 50% of the time counseling  in the visit 60 minutes and a plan will be that after I review her records,  we will recommend perhaps stopping one of the lipid medications and  following up with lab tests in a couple of months and an office visit at  that time.  However in the meantime, if her allergies are not improved or  needs some help with that we will see her for that also.                                  Neta Mends. Fabian Sharp,  MD   [facility]  DD:  11/12/2005  DT:  11/12/2005  Job #:  [id number]

## 2010-10-31 ENCOUNTER — Other Ambulatory Visit: Payer: Self-pay | Admitting: *Deleted

## 2010-10-31 MED ORDER — MONTELUKAST SODIUM 10 MG PO TABS: 10.0000 mg | ORAL_TABLET | Freq: Every day | ORAL | Status: DC

## 2010-10-31 NOTE — Telephone Encounter (Signed)
Not on patients medication list and patient not seen by you since 11-07-2009

## 2010-11-14 ENCOUNTER — Other Ambulatory Visit: Payer: Self-pay | Admitting: *Deleted

## 2010-11-14 MED ORDER — ATORVASTATIN CALCIUM 20 MG PO TABS: 20.0000 mg | ORAL_TABLET | Freq: Every day | ORAL | Status: DC

## 2010-11-20 ENCOUNTER — Other Ambulatory Visit: Payer: Self-pay | Admitting: *Deleted

## 2010-11-20 MED ORDER — ALBUTEROL SULFATE (2.5 MG/3ML) 0.083% IN NEBU: 2.5000 mg | INHALATION_SOLUTION | Freq: Four times a day (QID) | RESPIRATORY_TRACT | Status: DC | PRN

## 2010-11-20 NOTE — Telephone Encounter (Signed)
chart hasn't been abstracted yet. Several meds in centricity that arent' in epic yet (or PMH etc). Ok to refill.

## 2010-11-20 NOTE — Telephone Encounter (Signed)
Dr. Dayton Martes Patient was last refilled 10.-24-2011. Not on medication list

## 2010-12-24 ENCOUNTER — Encounter: Payer: Self-pay | Admitting: Family Medicine

## 2010-12-25 ENCOUNTER — Ambulatory Visit (INDEPENDENT_AMBULATORY_CARE_PROVIDER_SITE_OTHER): Payer: BC Managed Care – PPO | Admitting: Family Medicine

## 2010-12-25 ENCOUNTER — Encounter: Payer: Self-pay | Admitting: Family Medicine

## 2010-12-25 VITALS — BP 122/84 | HR 88 | Temp 99.2°F | Ht 63.0 in | Wt 257.2 lb

## 2010-12-25 DIAGNOSIS — R000 Tachycardia, unspecified: Secondary | ICD-10-CM

## 2010-12-25 DIAGNOSIS — R002 Palpitations: Secondary | ICD-10-CM | POA: Insufficient documentation

## 2010-12-25 DIAGNOSIS — F410 Panic disorder [episodic paroxysmal anxiety] without agoraphobia: Secondary | ICD-10-CM | POA: Insufficient documentation

## 2010-12-25 DIAGNOSIS — J45909 Unspecified asthma, uncomplicated: Secondary | ICD-10-CM

## 2010-12-25 MED ORDER — BUSPIRONE HCL 7.5 MG PO TABS: 7.5000 mg | ORAL_TABLET | Freq: Two times a day (BID) | ORAL | Status: DC

## 2010-12-25 MED ORDER — BUSPIRONE HCL 7.5 MG PO TABS: 7.5000 mg | ORAL_TABLET | Freq: Three times a day (TID) | ORAL | Status: DC

## 2010-12-25 MED ORDER — ALPRAZOLAM 0.5 MG PO TABS: 0.5000 mg | ORAL_TABLET | Freq: Two times a day (BID) | ORAL | Status: DC | PRN

## 2010-12-25 NOTE — Assessment & Plan Note (Signed)
I think this is where sxs are coming from, will focus on treatment here first. Start buspar to see if we can help decrease anxiety, use xanax for breakthrough anxiety if starts having attack. RTC 1 mo for f/u, to notify us sooner if sxs not improving as expected.

## 2010-12-25 NOTE — Assessment & Plan Note (Signed)
I doubt sxs coming from dulera as has tolerated this med for 1 year prior to sxs starting. Given worsened asthma, recommended restart 1 puff bid. f/u with PCP 1 mo

## 2010-12-25 NOTE — Patient Instructions (Addendum)
I think that this is being caused more by anxiety. I would restart dulera one puff twice daily as I don't think this is fully from this medicine. I would like to start buspar twice daily to help control anxiety.  Xanax to control anxiety if attack comes on. If not better with above treatment, let me know for referral for holter monitor. I'd like you to follow up with Dr. Dayton Martes in 1 month. Let us know sooner if things aren't getting better.

## 2010-12-25 NOTE — Assessment & Plan Note (Signed)
Anticipate largely due to anxiety.  If not improving with anxiety treatment, will refer to cards for holter monitor (esp if continues to happen frequently). EKG today - NSR at 98, one PAC.  No ST/T changes, normal axis, normal intervals.

## 2010-12-25 NOTE — Progress Notes (Signed)
  Subjective:    Patient ID: [name], female    DOB: Aug 11, 1975, 35 y.o.   MRN: [id number]  HPI CC: anxiety, rapid heart rate  2wk h/o episodes of heart beating louder and faster.  Sensation at mouth of neck.  Feels like ran marathon.  This has never woken her up from sleep but has kept her from falling asleep.  Yesterday had episode.  Checked pulse and 110.  Happened when eating lunch in middle of day (first time) so decided to come in.  Coinciding with this, oldest son started kindergarten (6yo) and has had some behavioral issues so she is worried about this.  Lots of stress in life - recently adopted 2 children from [place].  Definitely more stress recently.  When had this episode yesterday, tried 1/2 of left over valium with improvement of sxs.  Then took another 1/2 valium and slept well last night.  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  First anxiety/rapid heart beat episode happened when she was watching movie 2 wks ago.  Also had an episode that lasted several hours at night.  Has had a few episodes years ago, very intermittent and not as severe as currently.  Newest medicine is dulera, started 1 year ago and wondered if that was contributing, stopped for last 2 weeks and noticed asthma worsened.  Trouble breathing in am.  Thinks may have had improvement in episodes.  Took dulera yesterday and today, had episodes later in day on both days.  No family history of irregular heart beats.  + grandparents with CAD/CVA/CHF but not early fmhx.  No h/o HTN, no h/o smoking. Lab Results  Component Value Date   LDLCALC 50 11/28/2008   Review of Systems Per HPI    Objective:   Physical Exam  Nursing note and vitals reviewed. Constitutional: She appears well-developed and well-nourished. No distress.  HENT:  Head: Normocephalic and atraumatic.  Neck: Normal range of motion. Neck supple. Carotid bruit is not present.  Cardiovascular: Normal rate, regular rhythm and normal heart sounds.   No murmur  heard. Pulmonary/Chest: Effort normal and breath sounds normal. No respiratory distress. She has no wheezes. She has no rales.  Musculoskeletal: She exhibits no edema.  Lymphadenopathy:    She has no cervical adenopathy.  Skin: Skin is warm and dry. No rash noted.  Psychiatric: She has a normal mood and affect.          Assessment & Plan:

## 2011-01-20 ENCOUNTER — Encounter: Payer: Self-pay | Admitting: Family Medicine

## 2011-01-20 ENCOUNTER — Ambulatory Visit (INDEPENDENT_AMBULATORY_CARE_PROVIDER_SITE_OTHER): Payer: BC Managed Care – PPO | Admitting: Family Medicine

## 2011-01-20 VITALS — BP 122/88 | HR 87 | Temp 98.7°F | Ht 63.0 in | Wt 257.5 lb

## 2011-01-20 DIAGNOSIS — F410 Panic disorder [episodic paroxysmal anxiety] without agoraphobia: Secondary | ICD-10-CM

## 2011-01-20 DIAGNOSIS — R000 Tachycardia, unspecified: Secondary | ICD-10-CM

## 2011-01-20 LAB — CBC WITH DIFFERENTIAL/PLATELET
Basophils Absolute: 0.2 10*3/uL — ABNORMAL HIGH (ref 0.0–0.1)
Eosinophils Absolute: 0.3 10*3/uL (ref 0.0–0.7)
Hemoglobin: 13.7 g/dL (ref 12.0–15.0)
Lymphocytes Relative: 41.3 % (ref 12.0–46.0)
Lymphs Abs: 3.5 10*3/uL (ref 0.7–4.0)
MCHC: 33 g/dL (ref 30.0–36.0)
Monocytes Absolute: 0.5 10*3/uL (ref 0.1–1.0)
Neutro Abs: 4.1 10*3/uL (ref 1.4–7.7)
RDW: 13.4 % (ref 11.5–14.6)

## 2011-01-20 LAB — BASIC METABOLIC PANEL
Calcium: 9 mg/dL (ref 8.4–10.5)
Creatinine, Ser: 0.7 mg/dL (ref 0.4–1.2)
GFR: 106.07 mL/min (ref >60.00)

## 2011-01-20 LAB — TSH: TSH: 2.75 u[IU]/mL (ref 0.35–5.50)

## 2011-01-20 MED ORDER — DIAZEPAM 2 MG PO TABS: 2.0000 mg | ORAL_TABLET | Freq: Three times a day (TID) | ORAL | Status: DC | PRN

## 2011-01-20 NOTE — Patient Instructions (Signed)
Good to see you. Please take one Buspar every other day then stop. Please stop by to see Shirlee Limerick on your way out.

## 2011-01-20 NOTE — Progress Notes (Signed)
Subjective:    Patient ID: [name], female    DOB: 05-22-1975, 35 y.o.   MRN: [id number]  HPI  35 yo here for one month follow up.  Saw Dr. Reece Agar last month for 2 week long episodes of heart beating louder and faster.   Was under increased stress at the time.  Oldest son started kindergarten  and has had some behavioral issues and had just adopted two kids from [place].  Started on Buspar 7.5 mg twice daily  last month with has needed Xanax. Has not noticed any improvement with Buspar but has side effects of dizziness.  Still having palpitations- can occur at rest or with exertion. No CP, sometimes short of breath. Xanax helps but takes a long time to take affect.  EKG within normal limits last month. No blood work done.  No family history of irregular heart beats.  + grandparents with CAD/CVA/CHF but not early fmhx.  No h/o HTN, no h/o smoking.  Patient Active Problem List  Diagnoses  . HYPERLIPIDEMIA  . ACUTE PHARYNGITIS  . ALLERGIC RHINITIS  . ASTHMA  . GERD  . GASTROENTERITIS  . Anxiety attack  . Tachycardia   Past Medical History  Diagnosis Date  . Acne   . Allergic rhinitis   . Asthma   . HLD (hyperlipidemia)   . Chronic bronchitis   . Seasonal allergies   . GERD (gastroesophageal reflux disease)   . Blood in stool    Past Surgical History  Procedure Date  . Cholecystectomy   . Tonsillectomy   . Hemorroidectomy    History  Substance Use Topics  . Smoking status: Never Smoker   . Smokeless tobacco: Not on file  . Alcohol Use: Yes     Very rare; mixed drink   Family History  Problem Relation Age of Onset  . Arthritis Other     Family hx  . Diabetes Other     1st degree relative  . Hypertension Other     Family hx  . Stroke Other     1st degree relative < 50  . Other      Cardiovascular disorder--Family hx   Allergies  Allergen Reactions  . Sulfamethoxazole     REACTION: unspecified  . Sulfonamide Derivatives     REACTION: rash    Current Outpatient Prescriptions on File Prior to Visit  Medication Sig Dispense Refill  . albuterol (PROVENTIL) (2.5 MG/3ML) 0.083% nebulizer solution Take 2.5 mg by nebulization every 6 (six) hours as needed for wheezing.  150 mL  2  . ALPRAZolam (XANAX) 0.5 MG tablet Take 1 tablet (0.5 mg total) by mouth 2 (two) times daily as needed for anxiety.  30 tablet  0  . atorvastatin (LIPITOR) 20 MG tablet Take 1 tablet (20 mg total) by mouth daily.  90 tablet  3  . busPIRone (BUSPAR) 7.5 MG tablet Take 1 tablet (7.5 mg total) by mouth 2 (two) times daily.  60 tablet  1  . cetirizine (ZYRTEC) 10 MG tablet Take 10 mg by mouth daily.        . Cimetidine (TAGAMET PO) Take by mouth as needed.        . docusate sodium (COLACE) 100 MG capsule Take 200 mg by mouth at bedtime.        . drospirenone-ethinyl estradiol (YASMIN 28) 3-0.03 MG per tablet Take 1 tablet by mouth daily.  30 tablet  11  . mometasone (NASONEX) 50 MCG/ACT nasal spray Place 2 sprays into the  nose daily.        . mometasone-formoterol (DULERA) 100-5 MCG/ACT AERO Inhale 2 puffs into the lungs 2 (two) times daily.        . montelukast (SINGULAIR) 10 MG tablet Take 1 tablet (10 mg total) by mouth at bedtime.  90 tablet  0  . multivitamin (BARIATRIC VIT W/EXTRA C) CHEW Chew 1 tablet by mouth daily.        Marland Kitchen omeprazole (PRILOSEC OTC) 20 MG tablet Take 20 mg by mouth as needed.       . Probiotic Product (ALIGN) 4 MG CAPS Take 1 capsule by mouth daily.         The PMH, PSH, Social History, Family History, Medications, and allergies have been reviewed in [facility], and have been updated if relevant.   Review of Systems Per HPI    Objective:   Physical Exam   BP 122/88  Pulse 87  Temp(Src) 98.7 F (37.1 C) (Oral)  Ht 5\' 3"  (1.6 m)  Wt 257 lb 8 oz (116.801 kg)  BMI 45.61 kg/m2  LMP 12/15/2010  Constitutional: She appears well-developed and well-nourished. No distress.  HENT:  Head: Normocephalic and atraumatic.  Neck: Normal range of  motion. Neck supple. Carotid bruit is not present.  Cardiovascular: Normal rate, regular rhythm and normal heart sounds.   No murmur heard. Pulmonary/Chest: Effort normal and breath sounds normal. No respiratory distress. She has no wheezes. She has no rales.  Musculoskeletal: She exhibits no edema.  Lymphadenopathy:    She has no cervical adenopathy.  Skin: Skin is warm and dry. No rash noted.  Psychiatric: She has a normal mood and affect.     Assessment & Plan:   1. Anxiety attack   Unchanged but no improvement with Buspar.  Cannot increase dose dose to side effects.  Discussed options with her.  We agreed that at this point, use as needed benzo- she is aware of dependency risk.  Also aware of sedation precautions.  Will rule out other factors with CBC, TSH, BMET and work up tachycardia further with holter monitor.   2. Tachycardia   Unchanged. See above. Holter monitor - 48 hour, TSH, T4, free, CBC w/Diff, Basic Metabolic Panel (BMET)

## 2011-01-21 ENCOUNTER — Other Ambulatory Visit: Payer: Self-pay | Admitting: Family Medicine

## 2011-01-21 ENCOUNTER — Encounter: Payer: BC Managed Care – PPO | Admitting: *Deleted

## 2011-01-21 DIAGNOSIS — R000 Tachycardia, unspecified: Secondary | ICD-10-CM

## 2011-01-21 LAB — LIPID PANEL
Cholesterol: 151 mg/dL (ref 0–200)
Triglycerides: 239 mg/dL — ABNORMAL HIGH (ref 0.0–149.0)

## 2011-01-21 NOTE — Progress Notes (Signed)
Opened in error

## 2011-01-22 ENCOUNTER — Encounter: Payer: Self-pay | Admitting: *Deleted

## 2011-01-29 ENCOUNTER — Telehealth: Payer: Self-pay | Admitting: *Deleted

## 2011-01-29 NOTE — Telephone Encounter (Signed)
Pt is asking for holter monitor results from last week.  I dont see any results in her chart.  She didn't see a cardiologist, just had the monitor put on by the techs at the [facility] office in [place].

## 2011-01-30 NOTE — Telephone Encounter (Signed)
Advised pt, she said she would call them.

## 2011-01-30 NOTE — Telephone Encounter (Signed)
Please call cardiology office to get results.  I do not see them either

## 2011-02-04 ENCOUNTER — Other Ambulatory Visit: Payer: Self-pay | Admitting: Family Medicine

## 2011-02-10 ENCOUNTER — Telehealth: Payer: Self-pay | Admitting: Family Medicine

## 2011-02-10 NOTE — Telephone Encounter (Signed)
Script for brand name singulair called to [facility], same quantity as the script sent in on 10/23 that was filled with generic.

## 2011-02-10 NOTE — Telephone Encounter (Signed)
Ok to refill as below.

## 2011-02-10 NOTE — Telephone Encounter (Signed)
Pt called concerned because she said that prescription should have been sent to [facility] for "prescribe as written" for Brand name singular and it should have been brand not generic and it was not filled that way.  Patient requesting it be sent in as brand only due to cost.  She can be called back at [phone] to confirm request.  She needs refilled asap due to change in weather. Thanks

## 2011-02-27 ENCOUNTER — Ambulatory Visit: Payer: BC Managed Care – PPO | Admitting: Cardiovascular Disease

## 2011-02-27 ENCOUNTER — Encounter: Payer: Self-pay | Admitting: Cardiovascular Disease

## 2011-02-27 ENCOUNTER — Ambulatory Visit (INDEPENDENT_AMBULATORY_CARE_PROVIDER_SITE_OTHER): Payer: BC Managed Care – PPO | Admitting: Cardiovascular Disease

## 2011-02-27 DIAGNOSIS — E669 Obesity, unspecified: Secondary | ICD-10-CM

## 2011-02-27 DIAGNOSIS — E785 Hyperlipidemia, unspecified: Secondary | ICD-10-CM

## 2011-02-27 DIAGNOSIS — J45909 Unspecified asthma, uncomplicated: Secondary | ICD-10-CM

## 2011-02-27 DIAGNOSIS — R0602 Shortness of breath: Secondary | ICD-10-CM

## 2011-02-27 DIAGNOSIS — R000 Tachycardia, unspecified: Secondary | ICD-10-CM

## 2011-02-27 MED ORDER — NEBIVOLOL HCL 5 MG PO TABS: 5.0000 mg | ORAL_TABLET | Freq: Every day | ORAL | Status: DC

## 2011-02-27 NOTE — Patient Instructions (Addendum)
You are doing well.  Please start bystolic 5 mg a day for fast heart rate. If you still have tachycardia with albuterol, the albuterol could be changed to xopenex inhaler and neb.   Please call us if you have new issues that need to be addressed  Please call the office for a followup visit as needed in the next several months.

## 2011-02-27 NOTE — Assessment & Plan Note (Signed)
Cholesterol is at goal on the current lipid regimen. No changes to the medications were made.  

## 2011-02-27 NOTE — Assessment & Plan Note (Signed)
As she has asthma we will need to avoid many of the beta blockers. Would like to treat her tachycardia as she is relatively fast during the daytime. She has this at baseline as well and this could be contributing to underlying shortness of breath.  We have suggested she start on bystolic 5 mg daily, which should have less pulmonary bronchoconstriction. I suggested she do this for one week and slowly titrate upwards as tolerated checking her blood pressure and heart rate. Ideally, we would like to slow her heart rate in the daytime, decrease her resting heart rate for symptom relief.

## 2011-02-27 NOTE — Assessment & Plan Note (Signed)
She is currently on albuterol inhaler and nebulizer. If she continues to have symptoms of tachycardia that are not well controlled on beta blocker, her inhaler and nebulizer could potentially be changed to Xopenex.

## 2011-02-27 NOTE — Assessment & Plan Note (Signed)
I suspect that her weight could be contributing to some of her symptoms of tachycardia and shortness of breath. We have encouraged continued exercise, careful diet management in an effort to lose weight.

## 2011-02-27 NOTE — Progress Notes (Signed)
Patient ID: [name], female    DOB: 02-19-1976, 35 y.o.   MRN: [id number]  HPI Comments: [name] is a very pleasant 35 year old woman with a history of obesity, asthma, recent symptoms of tachycardia and palpitations, anxiety who presents by referral from Dr. Dayton Martes for symptoms of tachycardia and palpitations.  She reports that her symptoms have been going on for quite some time. She has started avoiding caffeine and coffee, as well as candy and anything with sugar. This causes significant tachycardia. She notices her symptoms prominently in the daytime. She is active, takes care of children and is always on the go. She does have problems with her asthma at times and found that her albuterol inhaler and nebulizer also cause tachycardia.  She was started on buspirone but had to stop this secondary to feelings of being faint. She has shortness of breath with exertion which she attributes to her fast heart rate.  EKG today shows normal sinus rhythm with rate 90 beats per minute with no significant ST or T wave changes.  48 hour Holter monitor was completed recently that showed long periods of sinus tachycardia with heart rate up to 140 bpm, Improved heart rate at nighttime into the 70s. Rare short periods of ectopic atrial tachycardia.   Outpatient Encounter Prescriptions as of 02/27/2011  Medication Sig Dispense Refill  . albuterol (PROVENTIL) (2.5 MG/3ML) 0.083% nebulizer solution Take 2.5 mg by nebulization every 6 (six) hours as needed for wheezing.  150 mL  2  . atorvastatin (LIPITOR) 20 MG tablet Take 1 tablet (20 mg total) by mouth daily.  90 tablet  3  . cetirizine (ZYRTEC) 10 MG tablet Take 10 mg by mouth daily.        . Cimetidine (TAGAMET PO) Take by mouth as needed.        . diazepam (VALIUM) 2 MG tablet Take 1 tablet (2 mg total) by mouth every 8 (eight) hours as needed for anxiety.  30 tablet  0  . docusate sodium (COLACE) 100 MG capsule Take 200 mg by mouth at bedtime.         . drospirenone-ethinyl estradiol (YASMIN 28) 3-0.03 MG per tablet Take 1 tablet by mouth daily.  30 tablet  11  . mometasone (NASONEX) 50 MCG/ACT nasal spray Place 2 sprays into the nose daily.        . mometasone-formoterol (DULERA) 100-5 MCG/ACT AERO Inhale 2 puffs into the lungs 2 (two) times daily.        . multivitamin (BARIATRIC VIT W/EXTRA C) CHEW Chew 1 tablet by mouth daily.        Marland Kitchen omeprazole (PRILOSEC OTC) 20 MG tablet Take 20 mg by mouth as needed.       . Probiotic Product (ALIGN) 4 MG CAPS Take 1 capsule by mouth daily.        Marland Kitchen SINGULAIR 10 MG tablet TAKE 1 TABLET (10 MG TOTAL) BY MOUTH AT BEDTIME.  90 tablet  3     Review of Systems  Constitutional: Negative.   HENT: Negative.   Eyes: Negative.   Respiratory: Positive for shortness of breath.   Cardiovascular: Positive for palpitations.       Tachycardia  Gastrointestinal: Negative.   Musculoskeletal: Negative.   Skin: Negative.   Neurological: Negative.   Hematological: Negative.   Psychiatric/Behavioral: Negative.   All other systems reviewed and are negative.    BP 130/82  Pulse 90  Ht 5\' 3"  (1.6 m)  Wt 260 lb (117.935  kg)  BMI 46.06 kg/m2   Physical Exam  Nursing note and vitals reviewed. Constitutional: She is oriented to person, place, and time. She appears well-developed and well-nourished.       obese  HENT:  Head: Normocephalic.  Nose: Nose normal.  Mouth/Throat: Oropharynx is clear and moist.  Eyes: Conjunctivae are normal. Pupils are equal, round, and reactive to light.  Neck: Normal range of motion. Neck supple. No JVD present.  Cardiovascular: Normal rate, regular rhythm, S1 normal, S2 normal, normal heart sounds and intact distal pulses.  Exam reveals no gallop and no friction rub.   No murmur heard. Pulmonary/Chest: Effort normal and breath sounds normal. No respiratory distress. She has no wheezes. She has no rales. She exhibits no tenderness.  Abdominal: Soft. Bowel sounds are normal.  She exhibits no distension. There is no tenderness.  Musculoskeletal: Normal range of motion. She exhibits no edema and no tenderness.  Lymphadenopathy:    She has no cervical adenopathy.  Neurological: She is alert and oriented to person, place, and time. Coordination normal.  Skin: Skin is warm and dry. No rash noted. No erythema.  Psychiatric: She has a normal mood and affect. Her behavior is normal. Judgment and thought content normal.         Assessment and Plan

## 2011-03-13 ENCOUNTER — Other Ambulatory Visit: Payer: Self-pay | Admitting: *Deleted

## 2011-03-13 ENCOUNTER — Telehealth: Payer: Self-pay | Admitting: *Deleted

## 2011-03-13 MED ORDER — LEVALBUTEROL HCL 0.63 MG/3ML IN NEBU: 0.6300 mg | INHALATION_SOLUTION | RESPIRATORY_TRACT | Status: DC | PRN

## 2011-03-13 MED ORDER — LEVALBUTEROL TARTRATE 45 MCG/ACT IN AERO: 1.0000 | INHALATION_SPRAY | RESPIRATORY_TRACT | Status: DC | PRN

## 2011-03-13 NOTE — Telephone Encounter (Signed)
Pt states she is on Bystolic 5 mg and is about to increase to 10 mg for tachycardia. She was told by cardio that her current asthma meds could cause increased heart rate and was advised to consider Zopenex or something that doesn't effect heart rate. She wanted to find out your feelings on this and the med Zopenex. She would need something to replace her neb med,  Fast acting inhaler and her steroid inhaler. She is currently using  Dulera daily for asthma and  it does help so she feels she does need something to take daily for asthma. If are ok with changing meds she would like new meds called in to [facility]

## 2011-03-13 NOTE — Telephone Encounter (Signed)
Pt was seen in office 02/27/11 for tachycardia, bystolic 5mg  started daily. Per note can titrate. Pt states HR at night at rest average 80bpm, BP today 140/80, in office was 130/82. Pt feels "flip flopping" more at night still, so I have advised she pick up 10mg  samples and try daily; also suggested she could try 5mg  in AM and PM to see what works best for her. Pt will pick up and call office with results of 10mg . Hr seems to have already decr with beta blocker, but if continued tachycardia she knows to talk to Dr. Dayton Martes about possibly changing inhaler and neb to xopenex. She will try this first. Pt will call back in 1 week.

## 2011-03-13 NOTE — Telephone Encounter (Signed)
I wouldn't switch all of her inhalers at one time. I will send xopenex for rescue inhaler and debs first.   Keep [place] posted with her symptoms.

## 2011-03-21 ENCOUNTER — Telehealth: Payer: Self-pay | Admitting: *Deleted

## 2011-03-21 NOTE — Telephone Encounter (Signed)
Pt called stating that her BP running 132-140s/76-88 HR 76-86. Per my last phone note, she has been taking bystolic 10mg  samples in AM, does not c/o "flip flopping in chest as previously." She wants to know if she needs to stay on bystolic 10 or change dose (incr). She does seem very anxious in conversation, discussed with her other techniques to decr HR and symptoms before changing medications. She does say she has valium she takes at night to help sleep but has not been taking recently. She also was changed to Xopenex by Dr. Dayton Martes for rescue inhaler and neb as was recc in previous office note. Advised pt continue bystolic 10 in AM, use new inhaler meds, and valium prn in PM; monitor BP/HR, try relaxation techniques at night and cut out any caffeine after lunch time and call office if she has any recurrent symptoms or issues.

## 2011-04-03 ENCOUNTER — Telehealth: Payer: Self-pay | Admitting: *Deleted

## 2011-04-03 MED ORDER — NEBIVOLOL HCL 10 MG PO TABS: 10.0000 mg | ORAL_TABLET | Freq: Every day | ORAL | Status: DC

## 2011-04-03 NOTE — Telephone Encounter (Signed)
Referred by Dr. Dayton Martes for tachycardia and palps. Per note 11/15, we can titrate upward with Bystolic 5mg . Pt called stating she does want the bystolic 10mg  Rx sent in. Pt states her BP has been 140s/80s HR 70-80 on the bystolic 5mg  tabs taking 2 q AM. Per last phone note pt had recently changed to Xopenex and was going to take Valium as needed for sleep b/c HR running faster at night and "could not get to sleep." Since pt doubled bystolic for total of 10mg  daily she has been "sleeping much better, and have not needed to take any valium." Her tach/palpitations are much improved, frequency decr and only has very rare short-run episodes. She has no c/o at this time, notified pt Bystolic 10mg  tab sent in she will take qAM.

## 2011-04-25 ENCOUNTER — Other Ambulatory Visit: Payer: Self-pay | Admitting: *Deleted

## 2011-04-25 MED ORDER — DIAZEPAM 2 MG PO TABS: 2.0000 mg | ORAL_TABLET | Freq: Three times a day (TID) | ORAL | Status: AC | PRN

## 2011-04-25 NOTE — Telephone Encounter (Signed)
[facility] 

## 2011-05-19 ENCOUNTER — Other Ambulatory Visit: Payer: Self-pay | Admitting: *Deleted

## 2011-05-19 MED ORDER — MOMETASONE FURO-FORMOTEROL FUM 100-5 MCG/ACT IN AERO: 2.0000 | INHALATION_SPRAY | Freq: Two times a day (BID) | RESPIRATORY_TRACT | Status: DC

## 2011-06-10 ENCOUNTER — Other Ambulatory Visit: Payer: Self-pay | Admitting: Family Medicine

## 2011-06-10 NOTE — Telephone Encounter (Signed)
Pt requests refill on yaz, I dont seen any recent physicals or pap smears in her chart, she doesn't have any upcoming appts.

## 2011-11-05 ENCOUNTER — Other Ambulatory Visit: Payer: Self-pay | Admitting: Family Medicine

## 2011-11-20 ENCOUNTER — Other Ambulatory Visit: Payer: Self-pay | Admitting: Family Medicine

## 2012-03-05 ENCOUNTER — Other Ambulatory Visit: Payer: Self-pay | Admitting: Family Medicine

## 2012-04-06 ENCOUNTER — Other Ambulatory Visit: Payer: Self-pay | Admitting: Family Medicine

## 2012-04-13 ENCOUNTER — Other Ambulatory Visit: Payer: Self-pay | Admitting: Family Medicine

## 2012-04-16 ENCOUNTER — Telehealth: Payer: Self-pay

## 2012-04-16 NOTE — Telephone Encounter (Signed)
Pt already scheduled for CPX 06/09/12; pt request all meds to be refilled until seen. Asked pt if she was out of any med and she said no. Advised pt if needs refill before seen to call pharmacy and they will request refill when needed. Pt voiced understanding.

## 2012-05-03 ENCOUNTER — Ambulatory Visit (INDEPENDENT_AMBULATORY_CARE_PROVIDER_SITE_OTHER): Payer: BC Managed Care – PPO | Admitting: Family Medicine

## 2012-05-03 ENCOUNTER — Encounter: Payer: Self-pay | Admitting: Family Medicine

## 2012-05-03 ENCOUNTER — Other Ambulatory Visit: Payer: Self-pay | Admitting: Family Medicine

## 2012-05-03 VITALS — BP 104/80 | HR 72 | Temp 98.5°F | Resp 16 | Wt 264.0 lb

## 2012-05-03 DIAGNOSIS — J029 Acute pharyngitis, unspecified: Secondary | ICD-10-CM

## 2012-05-03 MED ORDER — AZITHROMYCIN 250 MG PO TABS: ORAL_TABLET | ORAL | Status: DC

## 2012-05-03 NOTE — Patient Instructions (Addendum)
Good to see you. Take Zpack as directed.  Call me in a week or so with an update.

## 2012-05-03 NOTE — Progress Notes (Signed)
SUBJECTIVE:  [name] is a 37 y.o. female who complains of sore throat, swollen glands and dry cough for 4 weeks weeks.  Took some left over amoxicillin she had at home for a few days but it upset her stomach.  Went to walk in clinic 2 weeks ago- placed on zpack.  Never tested for strep because she was already taking abx.   Symptoms improved but then deteriorated again recently.   She denies a history of anorexia and chest pain and admits to a history of asthma. Patient denies smoke cigarettes.  Patient Active Problem List  Diagnosis  . HYPERLIPIDEMIA  . ACUTE PHARYNGITIS  . ALLERGIC RHINITIS  . ASTHMA  . GERD  . Tachycardia  . Obesity   Past Medical History  Diagnosis Date  . Acne   . Allergic rhinitis   . Asthma   . HLD (hyperlipidemia)   . Chronic bronchitis   . Seasonal allergies   . GERD (gastroesophageal reflux disease)   . Blood in stool    Past Surgical History  Procedure Date  . Cholecystectomy   . Tonsillectomy   . Hemorroidectomy    History  Substance Use Topics  . Smoking status: Never Smoker   . Smokeless tobacco: Not on file  . Alcohol Use: Yes     Comment: Very rare; mixed drink   Family History  Problem Relation Age of Onset  . Arthritis Other     Family hx  . Diabetes Other     1st degree relative  . Hypertension Other     Family hx  . Stroke Other     1st degree relative < 50  . Other      Cardiovascular disorder--Family hx  . Heart attack Father    Allergies  Allergen Reactions  . Buspar (Buspirone Hcl)      Fainting feelings   . Sulfamethoxazole     REACTION: unspecified  . Sulfonamide Derivatives     REACTION: rash   Current Outpatient Prescriptions on File Prior to Visit  Medication Sig Dispense Refill  . cetirizine (ZYRTEC) 10 MG tablet Take 10 mg by mouth daily.        . Cimetidine (TAGAMET PO) Take by mouth as needed.        . docusate sodium (COLACE) 100 MG capsule Take 200 mg by mouth at bedtime.        .  levalbuterol (XOPENEX HFA) 45 MCG/ACT inhaler Inhale 1 puff into the lungs every 4 (four) hours as needed.      . levalbuterol (XOPENEX) 0.63 MG/3ML nebulizer solution Take 0.63 mg by nebulization every 4 (four) hours as needed.      Marland Kitchen LIPITOR 20 MG tablet TAKE 1 TABLET BY MOUTH DAILY  90 tablet  1  . mometasone-formoterol (DULERA) 100-5 MCG/ACT AERO Inhale 2 puffs into the lungs 2 (two) times daily.  13 g  6  . multivitamin (BARIATRIC VIT W/EXTRA C) CHEW Chew 1 tablet by mouth daily.        . nebivolol (BYSTOLIC) 10 MG tablet Take one tablet by mouth daily * Needs appointment with Dr for any additional refills*  30 tablet  0  . omeprazole (PRILOSEC OTC) 20 MG tablet Take 20 mg by mouth as needed.       . Probiotic Product (ALIGN) 4 MG CAPS Take 1 capsule by mouth daily.        Marland Kitchen SINGULAIR 10 MG tablet TAKE 1 TABLET BY MOUTH AT BEDTIME  30 tablet  0  . YASMIN 28 3-0.03 MG tablet TAKE 1 TABLET BY MOUTH DAILY.  28 tablet  11   The PMH, PSH, Social History, Family History, Medications, and allergies have been reviewed in [facility], and have been updated if relevant.    OBJECTIVE: BP 104/80  Pulse 72  Temp 98.5 F (36.9 C)  Resp 16  Wt 264 lb (119.75 kg)  She appears well, vital signs are as noted. Ears normal.  Throat and pharynx normal.  Neck supple. No adenopathy in the neck. Nose is congested. Sinuses non tender.  Faint wheezes, left > right  ASSESSMENT:  bronchitis  PLAN: Given duration and progression of symptoms, will treat for bacterial bronchitis with zpack.  Symptomatic therapy suggested: push fluids, rest and return office visit prn if symptoms persist or worsen. Call or return to clinic prn if these symptoms worsen or fail to improve as anticipated.

## 2012-05-04 ENCOUNTER — Other Ambulatory Visit: Payer: Self-pay | Admitting: Family Medicine

## 2012-05-10 ENCOUNTER — Ambulatory Visit (INDEPENDENT_AMBULATORY_CARE_PROVIDER_SITE_OTHER): Payer: BC Managed Care – PPO | Admitting: Family Medicine

## 2012-05-10 ENCOUNTER — Encounter: Payer: Self-pay | Admitting: Family Medicine

## 2012-05-10 VITALS — BP 130/80 | HR 84 | Temp 98.2°F | Resp 16

## 2012-05-10 DIAGNOSIS — J40 Bronchitis, not specified as acute or chronic: Secondary | ICD-10-CM

## 2012-05-10 MED ORDER — ALBUTEROL SULFATE (5 MG/ML) 0.5% IN NEBU
2.5000 mg | INHALATION_SOLUTION | Freq: Once | RESPIRATORY_TRACT | Status: AC
  Administered 2012-05-10: 2.5 mg via RESPIRATORY_TRACT

## 2012-05-10 MED ORDER — IPRATROPIUM BROMIDE 0.02 % IN SOLN
0.5000 mg | Freq: Once | RESPIRATORY_TRACT | Status: AC
  Administered 2012-05-10: 0.5 mg via RESPIRATORY_TRACT

## 2012-05-10 MED ORDER — DEXAMETHASONE SODIUM PHOSPHATE 10 MG/ML IJ SOLN
10.0000 mg | Freq: Once | INTRAMUSCULAR | Status: AC
  Administered 2012-05-10: 10 mg via INTRAMUSCULAR

## 2012-05-10 MED ORDER — IPRATROPIUM-ALBUTEROL 0.5-2.5 (3) MG/3ML IN SOLN: 3.0000 mL | RESPIRATORY_TRACT | Status: DC | PRN

## 2012-05-10 NOTE — Progress Notes (Signed)
SUBJECTIVE:  [name] is a 37 y.o. female with h/o asthma here for follow up.  Given Zpack on 1/20 for bronchitis.  Took some left over amoxicillin she had at home for a few days prior to this but it upset her stomach.  Went to walk in clinic 2 weeks ago prior to that- placed on zpack.  Never tested for strep because she was already taking abx.    Symptoms improved until a few days ago- exposed to another illness- daughter sick and [name] had worsening wheezing, runny nose, Tmax 102.  Using her xopenex nebs at home and feeling like it is not as effective. Albuterol can cause palpitations but she has been using her expired albuterol which she feel is more effective.  No CP.   Patient Active Problem List  Diagnosis  . HYPERLIPIDEMIA  . ACUTE PHARYNGITIS  . ALLERGIC RHINITIS  . ASTHMA  . GERD  . Tachycardia  . Obesity   Past Medical History  Diagnosis Date  . Acne   . Allergic rhinitis   . Asthma   . HLD (hyperlipidemia)   . Chronic bronchitis   . Seasonal allergies   . GERD (gastroesophageal reflux disease)   . Blood in stool    Past Surgical History  Procedure Date  . Cholecystectomy   . Tonsillectomy   . Hemorroidectomy    History  Substance Use Topics  . Smoking status: Never Smoker   . Smokeless tobacco: Not on file  . Alcohol Use: Yes     Comment: Very rare; mixed drink   Family History  Problem Relation Age of Onset  . Arthritis Other     Family hx  . Diabetes Other     1st degree relative  . Hypertension Other     Family hx  . Stroke Other     1st degree relative < 50  . Other      Cardiovascular disorder--Family hx  . Heart attack Father    Allergies  Allergen Reactions  . Buspar (Buspirone Hcl)      Fainting feelings   . Sulfamethoxazole     REACTION: unspecified  . Sulfonamide Derivatives     REACTION: rash   Current Outpatient Prescriptions on File Prior to Visit  Medication Sig Dispense Refill  . azithromycin (ZITHROMAX) 250  MG tablet 2 tabs by mouth on day 1 followed by 1 tablet by mouth days 2-6  6 tablet  0  . BYSTOLIC 10 MG tablet TAKE ONE TABLET BY MOUTH DAILY * NEEDS APPOINTMENT WITH DR FOR ANY ADDITIONAL REFILLS*  30 tablet  1  . cetirizine (ZYRTEC) 10 MG tablet Take 10 mg by mouth daily.        . Cimetidine (TAGAMET PO) Take by mouth as needed.        . docusate sodium (COLACE) 100 MG capsule Take 200 mg by mouth at bedtime.        . levalbuterol (XOPENEX HFA) 45 MCG/ACT inhaler Inhale 1 puff into the lungs every 4 (four) hours as needed.      . levalbuterol (XOPENEX) 0.63 MG/3ML nebulizer solution Take 0.63 mg by nebulization every 4 (four) hours as needed.      Marland Kitchen LIPITOR 20 MG tablet TAKE 1 TABLET BY MOUTH DAILY  90 tablet  1  . mometasone-formoterol (DULERA) 100-5 MCG/ACT AERO Inhale 2 puffs into the lungs 2 (two) times daily.  13 g  6  . multivitamin (BARIATRIC VIT W/EXTRA C) CHEW Chew 1 tablet by  mouth daily.        Marland Kitchen omeprazole (PRILOSEC OTC) 20 MG tablet Take 20 mg by mouth as needed.       . Probiotic Product (ALIGN) 4 MG CAPS Take 1 capsule by mouth daily.        Marland Kitchen SINGULAIR 10 MG tablet TAKE 1 TABLET BY MOUTH AT BEDTIME  30 tablet  0  . XOPENEX 0.63 MG/3ML nebulizer solution USE 1 VIAL EVERY 4 HRS AS NEEDED FOR WHEEZING  72 mL  1  . XOPENEX HFA 45 MCG/ACT inhaler INHALE 1 PUFF INTO THE LUNGS EVERY 4 (FOUR) HOURS AS NEEDED FOR WHEEZING.  1 Inhaler  2  . YASMIN 28 3-0.03 MG tablet TAKE 1 TABLET BY MOUTH DAILY.  28 tablet  11   The PMH, PSH, Social History, Family History, Medications, and allergies have been reviewed in [facility], and have been updated if relevant.    OBJECTIVE: BP 130/80  Pulse 84  Temp 98.2 F (36.8 C)  Resp 16  SpO2 99%  She appears well, vital signs are as noted. Ears normal.  Throat and pharynx normal.  Neck supple. No adenopathy in the neck. Nose is congested. Sinuses non tender.  Faint wheezes, left > right, No increased WOB  ASSESSMENT:  bronchitis  PLAN: Given  duration and progression of symptoms, given IM decadron in office for inflammation. Duonebs added to xopenex- she was given this office and tolerated it ok. Symptomatic therapy suggested: push fluids, rest and return office visit prn if symptoms persist or worsen. Call or return to clinic prn if these symptoms worsen or fail to improve as anticipated.

## 2012-05-10 NOTE — Addendum Note (Signed)
Addended by: Consuello Masse on: 05/10/2012 09:25 AM   Modules accepted: Orders

## 2012-05-15 ENCOUNTER — Other Ambulatory Visit: Payer: Self-pay | Admitting: Family Medicine

## 2012-05-19 ENCOUNTER — Other Ambulatory Visit: Payer: Self-pay | Admitting: Family Medicine

## 2012-05-25 ENCOUNTER — Other Ambulatory Visit: Payer: Self-pay | Admitting: Family Medicine

## 2012-05-25 DIAGNOSIS — E785 Hyperlipidemia, unspecified: Secondary | ICD-10-CM

## 2012-05-25 DIAGNOSIS — Z0000 Encounter for general adult medical examination without abnormal findings: Secondary | ICD-10-CM

## 2012-06-02 ENCOUNTER — Other Ambulatory Visit: Payer: BC Managed Care – PPO

## 2012-06-03 ENCOUNTER — Other Ambulatory Visit (INDEPENDENT_AMBULATORY_CARE_PROVIDER_SITE_OTHER): Payer: BC Managed Care – PPO

## 2012-06-03 DIAGNOSIS — E785 Hyperlipidemia, unspecified: Secondary | ICD-10-CM

## 2012-06-03 DIAGNOSIS — Z0000 Encounter for general adult medical examination without abnormal findings: Secondary | ICD-10-CM

## 2012-06-03 LAB — COMPREHENSIVE METABOLIC PANEL
Albumin: 3.5 g/dL (ref 3.5–5.2)
CO2: 25 mEq/L (ref 19–32)
GFR: 98.45 mL/min (ref >60.00)
Glucose, Bld: 94 mg/dL (ref 70–99)
Sodium: 138 mEq/L (ref 135–145)
Total Bilirubin: 0.6 mg/dL (ref 0.3–1.2)
Total Protein: 7.3 g/dL (ref 6.0–8.3)

## 2012-06-03 LAB — LIPID PANEL
LDL Cholesterol: 45 mg/dL (ref 0–99)
Total CHOL/HDL Ratio: 2
Triglycerides: 171 mg/dL — ABNORMAL HIGH (ref 0.0–149.0)

## 2012-06-09 ENCOUNTER — Encounter: Payer: Self-pay | Admitting: Family Medicine

## 2012-06-09 ENCOUNTER — Ambulatory Visit (INDEPENDENT_AMBULATORY_CARE_PROVIDER_SITE_OTHER): Payer: BC Managed Care – PPO | Admitting: Family Medicine

## 2012-06-09 VITALS — BP 112/78 | HR 68 | Temp 97.9°F | Ht 63.25 in | Wt 264.0 lb

## 2012-06-09 DIAGNOSIS — Z0000 Encounter for general adult medical examination without abnormal findings: Secondary | ICD-10-CM

## 2012-06-09 DIAGNOSIS — E669 Obesity, unspecified: Secondary | ICD-10-CM

## 2012-06-09 DIAGNOSIS — E785 Hyperlipidemia, unspecified: Secondary | ICD-10-CM

## 2012-06-09 DIAGNOSIS — J45909 Unspecified asthma, uncomplicated: Secondary | ICD-10-CM

## 2012-06-09 NOTE — Progress Notes (Signed)
Subjective:    Patient ID: [name], female    DOB: 07-04-1975, 37 y.o.   MRN: [id number]  HPI  37 yo here for CPX.    Has GYN- overdue for pap smear.   Anal fissures- sees Dr. Elnoria Howard.  Uses Nitroglycerin ointment which has been successful. HLD-  On Lipitor 20 mg daily.  Very strong FH of HLD and hypertriglyceridemai. Lab Results  Component Value Date   CHOL 133 06/03/2012   HDL 54.10 06/03/2012   LDLCALC 45 06/03/2012   LDLDIRECT 67.7 01/20/2011   TRIG 171.0* 06/03/2012   CHOLHDL 2 06/03/2012   HTN- BP well controlled Bystolic.  If she skips a dose, feels her heart racing.  Asthma- had a bad winter but her symptoms are finally now well controlled. Patient Active Problem List  Diagnosis  . HYPERLIPIDEMIA  . ALLERGIC RHINITIS  . ASTHMA  . GERD  . Tachycardia  . Obesity  . Routine general medical examination at a health care facility   Past Medical History  Diagnosis Date  . Acne   . Allergic rhinitis   . Asthma   . HLD (hyperlipidemia)   . Chronic bronchitis   . Seasonal allergies   . GERD (gastroesophageal reflux disease)   . Blood in stool    Past Surgical History  Procedure Laterality Date  . Cholecystectomy    . Tonsillectomy    . Hemorroidectomy     History  Substance Use Topics  . Smoking status: Never Smoker   . Smokeless tobacco: Not on file  . Alcohol Use: Yes     Comment: Very rare; mixed drink   Family History  Problem Relation Age of Onset  . Arthritis Other     Family hx  . Diabetes Other     1st degree relative  . Hypertension Other     Family hx  . Stroke Other     1st degree relative < 50  . Other      Cardiovascular disorder--Family hx  . Heart attack Father    Allergies  Allergen Reactions  . Buspar (Buspirone Hcl)      Fainting feelings   . Sulfamethoxazole     REACTION: unspecified  . Sulfonamide Derivatives     REACTION: rash   Current Outpatient Prescriptions on File Prior to Visit  Medication Sig Dispense Refill  .  BYSTOLIC 10 MG tablet TAKE ONE TABLET BY MOUTH DAILY * NEEDS APPOINTMENT WITH DR FOR ANY ADDITIONAL REFILLS*  30 tablet  1  . cetirizine (ZYRTEC) 10 MG tablet Take 10 mg by mouth daily.        . Cimetidine (TAGAMET PO) Take by mouth as needed.        . docusate sodium (COLACE) 100 MG capsule Take 200 mg by mouth at bedtime.        Marland Kitchen ipratropium-albuterol (DUONEB) 0.5-2.5 (3) MG/3ML SOLN Take 3 mLs by nebulization every 2 (two) hours as needed.  360 mL  0  . levalbuterol (XOPENEX) 0.63 MG/3ML nebulizer solution Take 0.63 mg by nebulization every 4 (four) hours as needed.      . lidocaine (XYLOCAINE) 2 % jelly Apply topically as needed.      Marland Kitchen LIPITOR 20 MG tablet TAKE 1 TABLET BY MOUTH DAILY  90 tablet  0  . mometasone-formoterol (DULERA) 100-5 MCG/ACT AERO Inhale 2 puffs into the lungs 2 (two) times daily.  13 g  6  . multivitamin (BARIATRIC VIT W/EXTRA C) CHEW Chew 1 tablet by mouth daily.        Marland Kitchen  nitroGLYCERIN (NITROGLYN) 2 % ointment Use topically as needed.      Marland Kitchen omeprazole (PRILOSEC OTC) 20 MG tablet Take 20 mg by mouth as needed.       . Probiotic Product (ALIGN) 4 MG CAPS Take 1 capsule by mouth daily.        Marland Kitchen SINGULAIR 10 MG tablet TAKE 1 TABLET BY MOUTH AT BEDTIME  30 tablet  0  . XOPENEX 0.63 MG/3ML nebulizer solution USE 1 VIAL EVERY 4 HRS AS NEEDED FOR WHEEZING  72 mL  1  . XOPENEX HFA 45 MCG/ACT inhaler INHALE 1 PUFF INTO THE LUNGS EVERY 4 (FOUR) HOURS AS NEEDED FOR WHEEZING.  1 Inhaler  2  . YASMIN 28 3-0.03 MG tablet TAKE 1 TABLET BY MOUTH DAILY.  28 tablet  0   No current facility-administered medications on file prior to visit.   The PMH, PSH, Social History, Family History, Medications, and allergies have been reviewed in [facility], and have been updated if relevant.   Review of Systems Per HPI    Patient reports no  vision/ hearing changes,anorexia, weight change, fever ,adenopathy, persistant / recurrent hoarseness, swallowing issues, chest pain, edema,persistant /  recurrent cough, hemoptysis, dyspnea(rest, exertional, paroxysmal nocturnal), gastrointestinal  bleeding (melena, rectal bleeding), abdominal pain, excessive heart burn, GU symptoms(dysuria, hematuria, pyuria, voiding/incontinence  Issues) syncope, focal weakness, severe memory loss, concerning skin lesions, depression, anxiety, abnormal bruising/bleeding, major joint swelling, breast masses or abnormal vaginal bleeding.    Objective:   Physical Exam   BP 112/78  Pulse 68  Temp(Src) 97.9 F (36.6 C)  Ht 5' 3.25" (1.607 m)  Wt 264 lb (119.75 kg)  BMI 46.37 kg/m2   General:  obese,well-nourished,in no acute distress; alert,appropriate and cooperative throughout examination Head:  normocephalic and atraumatic.   Eyes:  vision grossly intact, pupils equal, pupils round, and pupils reactive to light.   Ears:  R ear normal and L ear normal.   Nose:  no external deformity.   Mouth:  good dentition.   Neck:  No deformities, masses, or tenderness noted. Lungs:  Normal respiratory effort, chest expands symmetrically. Lungs are clear to auscultation, no crackles or wheezes. Heart:  Normal rate and regular rhythm. S1 and S2 normal without gallop, murmur, click, rub or other extra sounds. Abdomen:  Bowel sounds positive,abdomen soft and non-tender without masses, organomegaly or hernias noted. Msk:  No deformity or scoliosis noted of thoracic or lumbar spine.   Extremities:  No clubbing, cyanosis, edema, or deformity noted with normal full range of motion of all joints.   Neurologic:  alert & oriented X3 and gait normal.   Skin:  Intact without suspicious lesions or rashes Cervical Nodes:  No lymphadenopathy noted  Assessment and Plan: 1. Routine general medical examination at a health care facility Reviewed preventive care protocols, scheduled due services, and updated immunizations Discussed nutrition, exercise, diet, and healthy lifestyle.   2. Obesity She does plan on walking more now  that weather is improving.  3. HYPERLIPIDEMIA Stable on current dose of lipitor.  4. ASTHMA Stable on current meds.  No changes.

## 2012-06-09 NOTE — Patient Instructions (Addendum)
[facility] is very good- Dr. Rhea Belton, Dr. Leone Payor, Dr. Russella Dar, Dr. Christella Hartigan, Dr. Juanda Chance.   Dr. Matthias Hughs with [facility] is very good as well.  Please make an appointment with you Gynecologist.

## 2012-06-17 ENCOUNTER — Other Ambulatory Visit: Payer: Self-pay | Admitting: Family Medicine

## 2012-07-02 ENCOUNTER — Other Ambulatory Visit: Payer: Self-pay | Admitting: Family Medicine

## 2012-07-03 ENCOUNTER — Other Ambulatory Visit: Payer: Self-pay | Admitting: Family Medicine

## 2012-07-17 ENCOUNTER — Other Ambulatory Visit: Payer: Self-pay | Admitting: Family Medicine

## 2012-07-19 NOTE — Telephone Encounter (Signed)
Last filled 04/25/2011.

## 2012-07-20 NOTE — Telephone Encounter (Signed)
RX printed by mistake.  Phoned in to [facility].

## 2012-08-19 ENCOUNTER — Other Ambulatory Visit: Payer: Self-pay | Admitting: Family Medicine

## 2012-10-11 ENCOUNTER — Telehealth: Payer: Self-pay

## 2012-10-11 MED ORDER — NITROGLYCERIN 2 % TD OINT: TOPICAL_OINTMENT | TRANSDERMAL | Status: DC

## 2012-10-11 NOTE — Telephone Encounter (Signed)
Pt left v/m requesting rx for nitroglycerin ointment 0.4 % for anal fissures to [facility]. Pt said had discussed with Dr Dayton Martes at last appt but did not need refill then.Please advise.

## 2012-10-11 NOTE — Telephone Encounter (Signed)
Advised patient script has been sent to pharmacy.  She states this is a long term medicine and is requesting additional refills next time.

## 2012-10-11 NOTE — Telephone Encounter (Signed)
Rx refilled.

## 2012-10-13 NOTE — Telephone Encounter (Signed)
Pt left v/m said [facility] was faxing a request for nitroglycerin compound gel; pt request authorization done ASAP.

## 2012-10-14 ENCOUNTER — Telehealth: Payer: Self-pay

## 2012-10-14 ENCOUNTER — Other Ambulatory Visit: Payer: Self-pay | Admitting: Family Medicine

## 2012-10-14 MED ORDER — NITROGLYCERIN 2 % TD OINT: TOPICAL_OINTMENT | TRANSDERMAL | Status: DC

## 2012-10-14 NOTE — Telephone Encounter (Signed)
[name] at [facility] left v/m re; clarification of order for nitroglycerin; [name] said pt has been getting compounded product;Nitroglycerin 4% ointment for rectal fissures. This has to be made. [name] request cb; pt requesting ASAP.

## 2012-10-14 NOTE — Telephone Encounter (Signed)
Advised Kim at [facility]

## 2012-10-14 NOTE — Telephone Encounter (Signed)
I'm not sure what the question is.  Yes, ok to refill compounded product as she has been receiving it.

## 2012-10-14 NOTE — Telephone Encounter (Signed)
Last filled 09/20/2012.  Ok to refill?

## 2012-11-21 ENCOUNTER — Other Ambulatory Visit: Payer: Self-pay | Admitting: Family Medicine

## 2012-11-30 ENCOUNTER — Other Ambulatory Visit: Payer: Self-pay | Admitting: Family Medicine

## 2012-12-01 NOTE — Telephone Encounter (Signed)
[facility] 

## 2012-12-08 ENCOUNTER — Other Ambulatory Visit: Payer: Self-pay | Admitting: Family Medicine

## 2012-12-24 ENCOUNTER — Other Ambulatory Visit: Payer: Self-pay | Admitting: Family Medicine

## 2013-01-02 ENCOUNTER — Other Ambulatory Visit: Payer: Self-pay | Admitting: Family Medicine

## 2013-01-26 ENCOUNTER — Encounter: Payer: Self-pay | Admitting: Family Medicine

## 2013-01-26 ENCOUNTER — Ambulatory Visit (INDEPENDENT_AMBULATORY_CARE_PROVIDER_SITE_OTHER): Payer: BC Managed Care – PPO | Admitting: Family Medicine

## 2013-01-26 VITALS — BP 110/66 | HR 71 | Temp 98.4°F | Ht 63.25 in | Wt 265.5 lb

## 2013-01-26 DIAGNOSIS — J209 Acute bronchitis, unspecified: Secondary | ICD-10-CM

## 2013-01-26 DIAGNOSIS — J208 Acute bronchitis due to other specified organisms: Secondary | ICD-10-CM | POA: Insufficient documentation

## 2013-01-26 DIAGNOSIS — J45909 Unspecified asthma, uncomplicated: Secondary | ICD-10-CM

## 2013-01-26 MED ORDER — PREDNISONE 10 MG PO TABS: ORAL_TABLET | ORAL | Status: DC

## 2013-01-26 MED ORDER — BENZONATATE 200 MG PO CAPS: 200.0000 mg | ORAL_CAPSULE | Freq: Three times a day (TID) | ORAL | Status: DC | PRN

## 2013-01-26 NOTE — Progress Notes (Signed)
Subjective:    Patient ID: [name], female    DOB: 22-Nov-1975, 37 y.o.   MRN: [id number]  HPI Here for uri symptoms  Started middle of last week with cold symptoms and post nasal drip Then cough - worse at night (unable to sleep) And lost her voice   She has asthma  Used nebulizer 5 times yesterday  Used nebulizer just once today  otc - tried tylenol cold and flu med   No facial pain but she does have a headache  Some of her mucous is yellow to green - sometimes clear   No fever right now - but has felt feverish   Eyes feel swollen and watery Takes zyrtec and singulair all the time   Patient Active Problem List   Diagnosis Date Noted  . Routine general medical examination at a health care facility 06/09/2012  . Obesity 02/27/2011  . Tachycardia 12/25/2010  . HYPERLIPIDEMIA 12/15/2006  . ALLERGIC RHINITIS 12/15/2006  . ASTHMA 12/15/2006  . GERD 12/15/2006   Past Medical History  Diagnosis Date  . Acne   . Allergic rhinitis   . Asthma   . HLD (hyperlipidemia)   . Chronic bronchitis   . Seasonal allergies   . GERD (gastroesophageal reflux disease)   . Blood in stool    Past Surgical History  Procedure Laterality Date  . Cholecystectomy    . Tonsillectomy    . Hemorroidectomy     History  Substance Use Topics  . Smoking status: Never Smoker   . Smokeless tobacco: Not on file  . Alcohol Use: Yes     Comment: Very rare; mixed drink   Family History  Problem Relation Age of Onset  . Arthritis Other     Family hx  . Diabetes Other     1st degree relative  . Hypertension Other     Family hx  . Stroke Other     1st degree relative < 50  . Other      Cardiovascular disorder--Family hx  . Heart attack Father    Allergies  Allergen Reactions  . Buspar [Buspirone Hcl]      Fainting feelings   . Sulfamethoxazole     REACTION: unspecified  . Sulfonamide Derivatives     REACTION: rash   Current Outpatient Prescriptions on File Prior to Visit   Medication Sig Dispense Refill  . BYSTOLIC 10 MG tablet TAKE ONE TABLET BY MOUTH DAILY  30 tablet  5  . cetirizine (ZYRTEC) 10 MG tablet Take 10 mg by mouth daily.        . Cimetidine (TAGAMET PO) Take by mouth as needed.        . diazepam (VALIUM) 2 MG tablet TAKE 1 TABLET BY MOUTH EVERY 8 HOURS AS NEEDED  30 tablet  0  . docusate sodium (COLACE) 100 MG capsule Take 200 mg by mouth at bedtime.        . DULERA 100-5 MCG/ACT AERO INHALE 2 PUFFS INTO THE LUNGS 2 (TWO) TIMES DAILY.  13 g  5  . lidocaine (XYLOCAINE) 2 % jelly Apply topically as needed.      Marland Kitchen LIPITOR 20 MG tablet TAKE 1 TABLET BY MOUTH DAILY  90 tablet  1  . nitroGLYCERIN (NITROGLYN) 2 % ointment Use topically as needed.  30 g  0  . omeprazole (PRILOSEC OTC) 20 MG tablet Take 20 mg by mouth as needed.       . Probiotic Product (ALIGN) 4 MG CAPS Take  1 capsule by mouth daily.        Marland Kitchen SINGULAIR 10 MG tablet TAKE 1 TABLET BY MOUTH AT BEDTIME  30 tablet  5  . XOPENEX 0.63 MG/3ML nebulizer solution USE 1 VIAL EVERY 4 HRS AS NEEDED FOR WHEEZING  72 mL  1  . XOPENEX HFA 45 MCG/ACT inhaler INHALE 1 PUFF INTO THE LUNGS EVERY 4 (FOUR) HOURS AS NEEDED FOR WHEEZING.  1 Inhaler  2  . YASMIN 28 3-0.03 MG tablet TAKE 1 TABLET BY MOUTH DAILY.  28 tablet  5   No current facility-administered medications on file prior to visit.     Review of Systems Review of Systems  Constitutional: Negative for fever, appetite change, fatigue and unexpected weight change.  ENT pos for post nasal drip/ neg for sinus pain  Eyes: Negative for pain and visual disturbance.  Respiratory: Negative for  shortness of breath.  pos for cough and wheeze  Cardiovascular: Negative for cp or palpitations    Gastrointestinal: Negative for nausea, diarrhea and constipation.  Genitourinary: Negative for urgency and frequency.  Skin: Negative for pallor or rash   Neurological: Negative for weakness, light-headedness, numbness and headaches.  Hematological: Negative for  adenopathy. Does not bruise/bleed easily.  Psychiatric/Behavioral: Negative for dysphoric mood. The patient is not nervous/anxious.           Objective:   Physical Exam  Constitutional: She appears well-developed and well-nourished. No distress.  obese and well appearing   HENT:  Head: Normocephalic and atraumatic.  Right Ear: External ear normal.  Left Ear: External ear normal.  Mouth/Throat: Oropharynx is clear and moist. No oropharyngeal exudate.  Nares are injected and congested   No sinus tenderness   Eyes: Conjunctivae and EOM are normal. Pupils are equal, round, and reactive to light. Right eye exhibits no discharge. Left eye exhibits no discharge. No scleral icterus.  Neck: Normal range of motion. Neck supple. No JVD present. No thyromegaly present.  Cardiovascular: Normal rate, regular rhythm, normal heart sounds and intact distal pulses.  Exam reveals no gallop.   Pulmonary/Chest: Effort normal. No respiratory distress. She has wheezes. She has no rales. She exhibits no tenderness.  Wheeze on forced exp only  Good air exch No prol exp phase  No rales  Harsh bs diffusely  Abdominal: Soft. Bowel sounds are normal.  Lymphadenopathy:    She has no cervical adenopathy.  Neurological: She is alert.  Skin: Skin is warm and dry. No rash noted.  Psychiatric: She has a normal mood and affect.          Assessment & Plan:

## 2013-01-26 NOTE — Patient Instructions (Signed)
Drink lots of fluids Continue nebulizer treatments as needed Take prednisone taper as directed  Tessalon for cough as needed  Rest  Update if not starting to improve in a week or if worsening

## 2013-01-27 NOTE — Assessment & Plan Note (Signed)
exac with viral bronchitis pred taper Rescue Nmt/ mdi albuterol Fluids/ rest Update if not starting to improve in a week or if worsening  -esp if fever or worse cough

## 2013-01-27 NOTE — Assessment & Plan Note (Signed)
With asthma exac  Disc cough control pred taper Disc symptomatic care - see instructions on AVS .udpate

## 2013-02-18 ENCOUNTER — Other Ambulatory Visit: Payer: Self-pay | Admitting: Family Medicine

## 2013-02-25 ENCOUNTER — Telehealth: Payer: Self-pay | Admitting: Family Medicine

## 2013-02-25 ENCOUNTER — Encounter: Payer: Self-pay | Admitting: *Deleted

## 2013-02-25 NOTE — Telephone Encounter (Signed)
Pt works at [employer] and needs a doctors note stating that she cannot work outside in the cold weather r/t to her significant allergies, asthma, and skin condition in the winter.  Please fax letter to [phone], attn: Mrs. Sockwell (principal).  Please call pt to confirm letter has been sent.

## 2013-02-25 NOTE — Telephone Encounter (Signed)
Letter printed, signed, and faxed. Lmom that letter had been faxed.

## 2013-02-25 NOTE — Telephone Encounter (Signed)
Can you make this letter and I will sign it

## 2013-05-01 ENCOUNTER — Other Ambulatory Visit: Payer: Self-pay | Admitting: Family Medicine

## 2013-05-02 NOTE — Telephone Encounter (Signed)
Called to [facility]. 

## 2013-05-02 NOTE — Telephone Encounter (Signed)
Last office visit 01/26/2013 with Dr. Milinda Antisower.  Ok to refill?

## 2013-05-13 ENCOUNTER — Other Ambulatory Visit: Payer: Self-pay | Admitting: Family Medicine

## 2013-05-13 NOTE — Telephone Encounter (Signed)
Pt requesting medication refill. Last ov 05/2012 with no future appts scheduled. pls advise

## 2013-05-16 NOTE — Telephone Encounter (Signed)
Ok to refill one time only.  Needs labs and or office visit for further refills.

## 2013-07-20 ENCOUNTER — Other Ambulatory Visit: Payer: Self-pay | Admitting: Family Medicine

## 2013-07-21 ENCOUNTER — Other Ambulatory Visit: Payer: Self-pay | Admitting: Family Medicine

## 2013-07-29 ENCOUNTER — Ambulatory Visit: Payer: BC Managed Care – PPO | Admitting: Family Medicine

## 2013-08-05 ENCOUNTER — Ambulatory Visit: Payer: BC Managed Care – PPO | Admitting: Family Medicine

## 2013-08-12 ENCOUNTER — Encounter: Payer: Self-pay | Admitting: Family Medicine

## 2013-08-12 ENCOUNTER — Ambulatory Visit (INDEPENDENT_AMBULATORY_CARE_PROVIDER_SITE_OTHER): Payer: BC Managed Care – PPO | Admitting: Family Medicine

## 2013-08-12 VITALS — BP 124/82 | HR 70 | Temp 98.4°F | Ht 62.5 in | Wt 262.5 lb

## 2013-08-12 DIAGNOSIS — Z0000 Encounter for general adult medical examination without abnormal findings: Secondary | ICD-10-CM

## 2013-08-12 DIAGNOSIS — Z833 Family history of diabetes mellitus: Secondary | ICD-10-CM

## 2013-08-12 DIAGNOSIS — R000 Tachycardia, unspecified: Secondary | ICD-10-CM

## 2013-08-12 DIAGNOSIS — G4700 Insomnia, unspecified: Secondary | ICD-10-CM

## 2013-08-12 DIAGNOSIS — E785 Hyperlipidemia, unspecified: Secondary | ICD-10-CM

## 2013-08-12 DIAGNOSIS — J45909 Unspecified asthma, uncomplicated: Secondary | ICD-10-CM

## 2013-08-12 DIAGNOSIS — J309 Allergic rhinitis, unspecified: Secondary | ICD-10-CM

## 2013-08-12 DIAGNOSIS — K219 Gastro-esophageal reflux disease without esophagitis: Secondary | ICD-10-CM

## 2013-08-12 LAB — TSH: TSH: 2.921 u[IU]/mL (ref 0.350–4.500)

## 2013-08-12 LAB — CBC WITH DIFFERENTIAL/PLATELET
Basophils Absolute: 0 10*3/uL (ref 0.0–0.1)
Basophils Relative: 0 % (ref 0–1)
Eosinophils Absolute: 0.2 10*3/uL (ref 0.0–0.7)
Eosinophils Relative: 2 % (ref 0–5)
HEMATOCRIT: 37.9 % (ref 36.0–46.0)
Hemoglobin: 13.1 g/dL (ref 12.0–15.0)
Lymphocytes Relative: 49 % — ABNORMAL HIGH (ref 12–46)
Lymphs Abs: 3.8 10*3/uL (ref 0.7–4.0)
MCH: 28.2 pg (ref 26.0–34.0)
MCHC: 34.6 g/dL (ref 30.0–36.0)
MCV: 81.5 fL (ref 78.0–100.0)
MONO ABS: 0.4 10*3/uL (ref 0.1–1.0)
Monocytes Relative: 5 % (ref 3–12)
Neutro Abs: 3.4 10*3/uL (ref 1.7–7.7)
Neutrophils Relative %: 44 % (ref 43–77)
Platelets: 231 10*3/uL (ref 150–400)
RBC: 4.65 MIL/uL (ref 3.87–5.11)
RDW: 13.4 % (ref 11.5–15.5)
WBC: 7.8 10*3/uL (ref 4.0–10.5)

## 2013-08-12 LAB — LIPID PANEL
CHOL/HDL RATIO: 2.9 ratio
CHOLESTEROL: 140 mg/dL (ref 0–200)
HDL: 48 mg/dL (ref >39)
LDL Cholesterol: 46 mg/dL (ref 0–99)
TRIGLYCERIDES: 229 mg/dL — AB (ref <150)
VLDL: 46 mg/dL — AB (ref 0–40)

## 2013-08-12 LAB — COMPREHENSIVE METABOLIC PANEL
ALT: 33 U/L (ref 0–35)
AST: 68 U/L — ABNORMAL HIGH (ref 0–37)
Albumin: 3.8 g/dL (ref 3.5–5.2)
Alkaline Phosphatase: 92 U/L (ref 39–117)
BUN: 8 mg/dL (ref 6–23)
CO2: 29 meq/L (ref 19–32)
CREATININE: 0.61 mg/dL (ref 0.50–1.10)
Calcium: 9.4 mg/dL (ref 8.4–10.5)
Chloride: 102 mEq/L (ref 96–112)
GLUCOSE: 94 mg/dL (ref 70–99)
Potassium: 4.3 mEq/L (ref 3.5–5.3)
SODIUM: 139 meq/L (ref 135–145)
TOTAL PROTEIN: 7 g/dL (ref 6.0–8.3)
Total Bilirubin: 0.3 mg/dL (ref 0.2–1.2)

## 2013-08-12 MED ORDER — MOMETASONE FURO-FORMOTEROL FUM 100-5 MCG/ACT IN AERO: INHALATION_SPRAY | RESPIRATORY_TRACT | Status: DC

## 2013-08-12 MED ORDER — MONTELUKAST SODIUM 10 MG PO TABS: ORAL_TABLET | ORAL | Status: DC

## 2013-08-12 MED ORDER — XOPENEX HFA 45 MCG/ACT IN AERO: INHALATION_SPRAY | RESPIRATORY_TRACT | Status: DC

## 2013-08-12 MED ORDER — DROSPIRENONE-ETHINYL ESTRADIOL 3-0.03 MG PO TABS: ORAL_TABLET | ORAL | Status: DC

## 2013-08-12 MED ORDER — XOPENEX 0.63 MG/3ML IN NEBU: INHALATION_SOLUTION | RESPIRATORY_TRACT | Status: DC

## 2013-08-12 MED ORDER — NEBIVOLOL HCL 10 MG PO TABS: ORAL_TABLET | ORAL | Status: DC

## 2013-08-12 MED ORDER — DIAZEPAM 2 MG PO TABS: ORAL_TABLET | ORAL | Status: DC

## 2013-08-12 MED ORDER — NITROGLYCERIN 2 % TD OINT: TOPICAL_OINTMENT | TRANSDERMAL | Status: DC

## 2013-08-12 MED ORDER — NITROGLYCERIN 0.4 % RE OINT: TOPICAL_OINTMENT | RECTAL | Status: DC

## 2013-08-12 NOTE — Assessment & Plan Note (Signed)
Deteriorated- add nasacort and short course of decongestant. See AVS.

## 2013-08-12 NOTE — Assessment & Plan Note (Signed)
Deteriorated.  Refer to pulmonary for further evaluation and treatment.

## 2013-08-12 NOTE — Progress Notes (Signed)
Subjective:    Patient ID: [name], female    DOB: [birth year], 38 y.o.   MRN: [id number]  HPI  38 yo female whom I have not seen for routine care since 05/2012 here for med refills.   HLD-  On Lipitor 20 mg daily.  Very strong FH of HLD and hypertriglyceridemia.  Overdue for labs. Lab Results  Component Value Date   CHOL 133 06/03/2012   HDL 54.10 06/03/2012   LDLCALC 45 06/03/2012   LDLDIRECT 67.7 01/20/2011   TRIG 171.0* 06/03/2012   CHOLHDL 2 06/03/2012   HTN- BP well controlled Bystolic.  If she skips a dose, feels her heart racing.  Takes Valium when heart starts racing.  Asthma- has been worse this year.  Constantly around sick kids at school.  On Dulera and singular.  Allergic rhinitis- takes Zyrtec daily.  Feels ears have been full and at times has felt like she had vertigo.   Patient Active Problem List   Diagnosis Date Noted  . Insomnia 08/12/2013  . Obesity 02/27/2011  . Tachycardia 12/25/2010  . HYPERLIPIDEMIA 12/15/2006  . ALLERGIC RHINITIS 12/15/2006  . ASTHMA 12/15/2006  . GERD 12/15/2006   Past Medical History  Diagnosis Date  . Acne   . Allergic rhinitis   . Asthma   . HLD (hyperlipidemia)   . Chronic bronchitis   . Seasonal allergies   . GERD (gastroesophageal reflux disease)   . Blood in stool    Past Surgical History  Procedure Laterality Date  . Cholecystectomy    . Tonsillectomy    . Hemorroidectomy     History  Substance Use Topics  . Smoking status: Never Smoker   . Smokeless tobacco: Not on file  . Alcohol Use: Yes     Comment: Very rare; mixed drink   Family History  Problem Relation Age of Onset  . Arthritis Other     Family hx  . Diabetes Other     1st degree relative  . Hypertension Other     Family hx  . Stroke Other     1st degree relative < 50  . Other      Cardiovascular disorder--Family hx  . Heart attack Father    Allergies  Allergen Reactions  . Levalbuterol Shortness Of Breath  . Buspar [Buspirone Hcl]       Fainting feelings   . Sulfamethoxazole     REACTION: unspecified  . Sulfonamide Derivatives     REACTION: rash   Current Outpatient Prescriptions on File Prior to Visit  Medication Sig Dispense Refill  . cetirizine (ZYRTEC) 10 MG tablet Take 10 mg by mouth daily.        . Cimetidine (TAGAMET PO) Take by mouth as needed.        . docusate sodium (COLACE) 100 MG capsule Take 200 mg by mouth at bedtime.        . lidocaine (XYLOCAINE) 2 % jelly Apply topically as needed.      Marland Kitchen. LIPITOR 20 MG tablet TAKE 1 TABLET BY MOUTH DAILY  90 tablet  0  . omeprazole (PRILOSEC OTC) 20 MG tablet Take 20 mg by mouth as needed.       . Probiotic Product (ALIGN) 4 MG CAPS Take 1 capsule by mouth daily.         No current facility-administered medications on file prior to visit.   The PMH, PSH, Social History, Family History, Medications, and allergies [facility], and have been updated  if relevant.   Review of Systems Per HPI   No cough, CP SOB during asthma flares  Objective:   Physical Exam   BP 124/82  Pulse 70  Temp(Src) 98.4 F (36.9 C) (Oral)  Ht 5' 2.5" (1.588 m)  Wt 262 lb 8 oz (119.069 kg)  BMI 47.22 kg/m2  SpO2 98%  LMP 08/10/2013   General:  obese,well-nourished,in no acute distress; alert,appropriate and cooperative throughout examination Head:  normocephalic and atraumatic.   Eyes:  vision grossly intact, pupils equal, pupils round, and pupils reactive to light.   Ears:  Right tm retracted Nose:  no external deformity.   Mouth:  good dentition.   Neck:  No deformities, masses, or tenderness noted. Lungs:  Normal respiratory effort, chest expands symmetrically. Lungs are clear to auscultation, no crackles or wheezes. Heart:  Normal rate and regular rhythm. S1 and S2 normal without gallop, murmur, click, rub or other extra sounds. Abdomen:  Bowel sounds positive,abdomen soft and non-tender without masses, organomegaly or hernias noted. Msk:  No deformity or  scoliosis noted of thoracic or lumbar spine.   Extremities:  No clubbing, cyanosis, edema, or deformity noted with normal full range of motion of all joints.   Neurologic:  alert & oriented X3 and gait normal.   Skin:  Intact without suspicious lesions or rashes Cervical Nodes:  No lymphadenopathy noted

## 2013-08-12 NOTE — Patient Instructions (Addendum)
Try Zyrtec D instead of regular Zyrtec- only for a few days.  Try over the counter nasocort-start with 2 sprays per nostril per day...and then try to taper to 1 spray per nostril once symptoms improve.   We will call you with a pulmonology referral.

## 2013-08-12 NOTE — Progress Notes (Signed)
Pre visit review using our clinic review tool, if applicable. No additional management support is needed unless otherwise documented below in the visit note. 

## 2013-08-12 NOTE — Assessment & Plan Note (Signed)
Continue lipitor. Recheck labs today.

## 2013-08-13 LAB — HEMOGLOBIN A1C
Hgb A1c MFr Bld: 5.8 % — ABNORMAL HIGH (ref <5.7)
Mean Plasma Glucose: 120 mg/dL — ABNORMAL HIGH (ref <117)

## 2013-08-26 ENCOUNTER — Other Ambulatory Visit: Payer: Self-pay | Admitting: Family Medicine

## 2013-08-31 ENCOUNTER — Institutional Professional Consult (permissible substitution): Payer: BC Managed Care – PPO | Admitting: Pulmonary Disease

## 2013-09-28 ENCOUNTER — Other Ambulatory Visit: Payer: Self-pay | Admitting: Family Medicine

## 2013-09-28 NOTE — Telephone Encounter (Signed)
Pt requesting medication refill. Last f/u appt 08/2013. pls advise 

## 2013-09-29 NOTE — Telephone Encounter (Signed)
plz phone in. 

## 2013-09-29 NOTE — Telephone Encounter (Signed)
rx called into pharmacy

## 2013-10-03 LAB — PULMONARY FUNCTION TEST

## 2013-10-18 ENCOUNTER — Other Ambulatory Visit: Payer: Self-pay | Admitting: *Deleted

## 2013-10-18 MED ORDER — NITROGLYCERIN 0.4 % RE OINT: TOPICAL_OINTMENT | RECTAL | Status: DC

## 2013-10-18 NOTE — Telephone Encounter (Signed)
Pharmacy sent request for refills, pt uses for rectal fissures. She has a f/u scheduled 10/20/13 to discuss a medication. Is this ok to refill?

## 2013-10-20 ENCOUNTER — Encounter: Payer: Self-pay | Admitting: Family Medicine

## 2013-10-20 ENCOUNTER — Ambulatory Visit (INDEPENDENT_AMBULATORY_CARE_PROVIDER_SITE_OTHER): Payer: BC Managed Care – PPO | Admitting: Family Medicine

## 2013-10-20 VITALS — BP 118/70 | HR 74 | Temp 98.5°F | Ht 63.0 in | Wt 264.5 lb

## 2013-10-20 DIAGNOSIS — R7309 Other abnormal glucose: Secondary | ICD-10-CM | POA: Insufficient documentation

## 2013-10-20 MED ORDER — METFORMIN HCL 500 MG PO TABS: 500.0000 mg | ORAL_TABLET | Freq: Every day | ORAL | Status: DC

## 2013-10-20 NOTE — Progress Notes (Signed)
Subjective:   Patient ID: [name], female    DOB: 1976-04-12, 38 y.o.   MRN: [id number]  [name] is a pleasant 38 y.o. year old female who presents to clinic today with Follow-up  on 10/20/2013  HPI: Elevated a1c- Lab Results  Component Value Date   HGBA1C 5.8* 08/12/2013  Does have family h/o DM. Denies increased thirst or urination. She does take Lipitor 20 mg daily- she has a personal history of very high triglycerides in past (over 1000)   Lab Results  Component Value Date   CHOL 140 08/12/2013   HDL 48 08/12/2013   LDLCALC 46 08/12/2013   LDLDIRECT 67.7 01/20/2011   TRIG 229* 08/12/2013   CHOLHDL 2.9 08/12/2013   Current Outpatient Prescriptions on File Prior to Visit  Medication Sig Dispense Refill  . cetirizine (ZYRTEC) 10 MG tablet Take 10 mg by mouth daily.        . Cimetidine (TAGAMET PO) Take by mouth as needed.        . diazepam (VALIUM) 2 MG tablet TAKE 1 TABLET BY MOUTH EVERY 8 HOURS AS NEEDED  30 tablet  0  . docusate sodium (COLACE) 100 MG capsule Take 200 mg by mouth at bedtime.        . drospirenone-ethinyl estradiol (YASMIN 28) 3-0.03 MG tablet TAKE 1 TABLET BY MOUTH DAILY.  28 tablet  5  . lidocaine (XYLOCAINE) 2 % jelly Apply topically as needed.      Marland Kitchen LIPITOR 20 MG tablet TAKE 1 TABLET BY MOUTH DAILY  90 tablet  0  . mometasone-formoterol (DULERA) 100-5 MCG/ACT AERO INHALE 2 PUFFS INTO THE LUNGS 2 (TWO) TIMES DAILY.  13 g  0  . montelukast (SINGULAIR) 10 MG tablet TAKE 1 TABLET BY MOUTH AT BEDTIME  30 tablet  5  . nebivolol (BYSTOLIC) 10 MG tablet TAKE ONE TABLET BY MOUTH DAILY  30 tablet  5  . Nitroglycerin 0.4 % OINT Apply rectally three times daily as needed  1 Tube  3  . omeprazole (PRILOSEC OTC) 20 MG tablet Take 20 mg by mouth as needed.       . Probiotic Product (ALIGN) 4 MG CAPS Take 1 capsule by mouth daily.        Pauline Aus 0.63 MG/3ML nebulizer solution USE 1 VIAL EVERY 4 HRS AS NEEDED FOR WHEEZING  72 mL  1  . XOPENEX HFA 45 MCG/ACT inhaler  INHALE 1 PUFF INTO THE LUNGS EVERY 4 (FOUR) HOURS AS NEEDED FOR WHEEZING.  1 Inhaler  2   No current facility-administered medications on file prior to visit.    Allergies  Allergen Reactions  . Levalbuterol Shortness Of Breath  . Buspar [Buspirone Hcl]      Fainting feelings   . Sulfamethoxazole     REACTION: unspecified  . Sulfonamide Derivatives     REACTION: rash    Past Medical History  Diagnosis Date  . Acne   . Allergic rhinitis   . Asthma   . HLD (hyperlipidemia)   . Chronic bronchitis   . Seasonal allergies   . GERD (gastroesophageal reflux disease)   . Blood in stool     Past Surgical History  Procedure Laterality Date  . Cholecystectomy    . Tonsillectomy    . Hemorroidectomy      Family History  Problem Relation Age of Onset  . Arthritis Other     Family hx  . Diabetes Other     1st degree relative  .  Hypertension Other     Family hx  . Stroke Other     1st degree relative < 50  . Other      Cardiovascular disorder--Family hx  . Heart attack Father     History   Social History  . Marital Status: Married    Spouse Name: N/A    Number of Children: N/A  . Years of Education: N/A   Occupational History  . Not on file.   Social History Main Topics  . Smoking status: Never Smoker   . Smokeless tobacco: Not on file  . Alcohol Use: Yes     Comment: Very rare; mixed drink  . Drug Use: No  . Sexual Activity: Yes    Birth Control/ Protection: Pill   Other Topics Concern  . Not on file   Social History Narrative   No regular exercise   The PMH, PSH, Social History, Family History, Medications, and allergies have been reviewed in [facility], and have been updated if relevant.    Review of Systems See HPI No nausea or vomiting Does have h/o chronic constipation    Objective:    BP 118/70  Pulse 74  Temp(Src) 98.5 F (36.9 C) (Oral)  Ht 5\' 3"  (1.6 m)  Wt 264 lb 8 oz (119.976 kg)  BMI 46.87 kg/m2  SpO2 97%  LMP  09/26/2013   Physical Exam Gen:  Alert, pleasant, NAD Psych: good eye contact, not anxious or depressed appearing       Assessment & Plan:   Elevated hemoglobin A1c No Follow-up on file.

## 2013-10-20 NOTE — Progress Notes (Signed)
Pre visit review using our clinic review tool, if applicable. No additional management support is needed unless otherwise documented below in the visit note. 

## 2013-10-20 NOTE — Assessment & Plan Note (Signed)
>  15 minutes spent in face to face time with patient, >50% spent in counselling or coordination of care Discussed cutting back on carbs/sugars. Metformin 500 mg with breakfast daily. Follow up in 3 months. Discussed side effects may include loose stools- cut back on stool softeners for now.

## 2013-10-20 NOTE — Patient Instructions (Signed)
Good to see you. We are starting Metformin 500 mg with breakfast. Follow up in 3 months for labs.  GYN: Dr. Vincente PoliGrewal Dr. Ernestina PennaFogleman Dr. Senaida Oresichardson

## 2013-11-28 ENCOUNTER — Other Ambulatory Visit: Payer: Self-pay | Admitting: Family Medicine

## 2013-12-03 ENCOUNTER — Other Ambulatory Visit: Payer: Self-pay | Admitting: Family Medicine

## 2013-12-25 ENCOUNTER — Other Ambulatory Visit: Payer: Self-pay | Admitting: Family Medicine

## 2014-01-13 ENCOUNTER — Other Ambulatory Visit: Payer: Self-pay | Admitting: Family Medicine

## 2014-01-13 DIAGNOSIS — R7309 Other abnormal glucose: Secondary | ICD-10-CM

## 2014-01-13 DIAGNOSIS — E785 Hyperlipidemia, unspecified: Secondary | ICD-10-CM

## 2014-01-20 ENCOUNTER — Other Ambulatory Visit: Payer: BC Managed Care – PPO

## 2014-02-27 ENCOUNTER — Other Ambulatory Visit: Payer: Self-pay | Admitting: *Deleted

## 2014-02-27 ENCOUNTER — Other Ambulatory Visit: Payer: Self-pay

## 2014-02-27 MED ORDER — METFORMIN HCL 500 MG PO TABS: 500.0000 mg | ORAL_TABLET | Freq: Every day | ORAL | Status: DC

## 2014-02-27 MED ORDER — LIPITOR 20 MG PO TABS: ORAL_TABLET | ORAL | Status: DC

## 2014-02-27 NOTE — Telephone Encounter (Signed)
Pt request refill metformin to [facility]; advised pt done; pt has A1C scheduled for 03/01/14.

## 2014-03-01 ENCOUNTER — Other Ambulatory Visit (INDEPENDENT_AMBULATORY_CARE_PROVIDER_SITE_OTHER): Payer: BC Managed Care – PPO

## 2014-03-01 DIAGNOSIS — R7309 Other abnormal glucose: Secondary | ICD-10-CM

## 2014-03-01 DIAGNOSIS — E785 Hyperlipidemia, unspecified: Secondary | ICD-10-CM

## 2014-03-02 ENCOUNTER — Encounter: Payer: Self-pay | Admitting: *Deleted

## 2014-03-02 LAB — LIPID PANEL
CHOL/HDL RATIO: 3
Cholesterol: 143 mg/dL (ref 0–200)
HDL: 50.6 mg/dL (ref >39.00)
NonHDL: 92.4
Triglycerides: 249 mg/dL — ABNORMAL HIGH (ref 0.0–149.0)
VLDL: 49.8 mg/dL — AB (ref 0.0–40.0)

## 2014-03-02 LAB — HEMOGLOBIN A1C: Hgb A1c MFr Bld: 5.8 % (ref 4.6–6.5)

## 2014-03-02 LAB — LDL CHOLESTEROL, DIRECT: LDL DIRECT: 60 mg/dL

## 2014-03-29 ENCOUNTER — Telehealth: Payer: Self-pay | Admitting: *Deleted

## 2014-03-29 MED ORDER — NITROGLYCERIN 0.4 % RE OINT: TOPICAL_OINTMENT | RECTAL | Status: DC

## 2014-03-31 ENCOUNTER — Other Ambulatory Visit: Payer: Self-pay | Admitting: Internal Medicine

## 2014-03-31 MED ORDER — NITROGLYCERIN 0.4 % RE OINT: TOPICAL_OINTMENT | RECTAL | Status: DC

## 2014-03-31 NOTE — Telephone Encounter (Signed)
ntg ointment sent to [facility]

## 2014-03-31 NOTE — Addendum Note (Signed)
Addended by: Patience MuscaISLEY, RENA M on: 03/31/2014 02:04 PM   Modules accepted: Orders

## 2014-03-31 NOTE — Telephone Encounter (Addendum)
Pt left v/m; ntg that was sent to [facility] pt states was the wrong med and the wrong pharmacy; [facility] ntg compound. Pt has not used med sent [facility]. Pt request cb [facility] [place] [facility] .4% ointment is the correct med and the way it is on pts med list is correct. Jeanice LimHolly [facility].Please advise.

## 2014-04-04 ENCOUNTER — Other Ambulatory Visit: Payer: Self-pay | Admitting: *Deleted

## 2014-04-04 MED ORDER — DIAZEPAM 2 MG PO TABS: 2.0000 mg | ORAL_TABLET | Freq: Three times a day (TID) | ORAL | Status: DC | PRN

## 2014-04-04 NOTE — Telephone Encounter (Signed)
Ok to refill 

## 2014-04-04 NOTE — Telephone Encounter (Signed)
Rx called in as directed.   

## 2014-04-22 ENCOUNTER — Telehealth: Payer: Self-pay | Admitting: Family Medicine

## 2014-04-27 MED ORDER — YASMIN 28 3-0.03 MG PO TABS: ORAL_TABLET | ORAL | Status: DC

## 2014-04-27 NOTE — Telephone Encounter (Signed)
Pt called for status of DAW refill; [facility]; [facility] if [facility] brings back yasmin rx she has already picked up will run the DAW rx and if ins will pay DAW rx pt will receive $60.00 paid for 1st rx. Pt voiced understanding and request cb about why this happened and how to proceed in the future.

## 2014-04-27 NOTE — Addendum Note (Signed)
Addended by: Desmond DikeKNIGHT, [place]iginia Amendola H on: 04/27/2014 11:10 AM   Modules accepted: Orders

## 2014-04-27 NOTE — Telephone Encounter (Signed)
Fax received indicating pts BCP must be DAW. New Rx sent

## 2014-04-28 NOTE — Addendum Note (Signed)
Addended by: Desmond DikeKNIGHT, Miriana Gaertner H on: 04/28/2014 09:01 AM   Modules accepted: Orders, Medications

## 2014-04-28 NOTE — Telephone Encounter (Signed)
Spoke to pt and advised her to contact the pharmacy and ensure that her electronic request is marked as "DAW." I also advised her that hopefully, we will not encounter this again, since our system shows that she receives the name brand vs generic.

## 2014-05-22 ENCOUNTER — Other Ambulatory Visit: Payer: Self-pay | Admitting: Family Medicine

## 2014-05-29 ENCOUNTER — Other Ambulatory Visit: Payer: Self-pay | Admitting: Family Medicine

## 2014-06-14 ENCOUNTER — Other Ambulatory Visit: Payer: Self-pay | Admitting: Family Medicine

## 2014-06-26 ENCOUNTER — Other Ambulatory Visit: Payer: Self-pay

## 2014-06-26 MED ORDER — XOPENEX 0.63 MG/3ML IN NEBU: INHALATION_SOLUTION | RESPIRATORY_TRACT | Status: DC

## 2014-06-26 MED ORDER — METFORMIN HCL 500 MG PO TABS: 500.0000 mg | ORAL_TABLET | Freq: Every day | ORAL | Status: DC

## 2014-06-26 NOTE — Telephone Encounter (Signed)
Ok to refill one time only. 

## 2014-06-26 NOTE — Telephone Encounter (Signed)
Pt left vm requesting refill xopenex neb solution and metformin. Pt was last seen 10/20/13 and pt was supposed to f/u in 3 months; pt was NO SHOW on 01/20/14 for 3 month f/u appt.Please advise. No future appt scheduled. Please advise.

## 2014-06-29 ENCOUNTER — Other Ambulatory Visit: Payer: Self-pay | Admitting: Family Medicine

## 2014-07-05 ENCOUNTER — Other Ambulatory Visit: Payer: Self-pay | Admitting: Family Medicine

## 2014-07-06 NOTE — Telephone Encounter (Signed)
Rx called in to requested pharmacy 

## 2014-07-06 NOTE — Telephone Encounter (Signed)
Last f/u appt 10/2013 

## 2014-08-14 ENCOUNTER — Other Ambulatory Visit: Payer: Self-pay | Admitting: Family Medicine

## 2014-08-22 ENCOUNTER — Other Ambulatory Visit: Payer: Self-pay | Admitting: Family Medicine

## 2014-09-19 ENCOUNTER — Other Ambulatory Visit: Payer: Self-pay | Admitting: Family Medicine

## 2014-09-21 ENCOUNTER — Other Ambulatory Visit: Payer: Self-pay | Admitting: Family Medicine

## 2014-09-22 ENCOUNTER — Other Ambulatory Visit: Payer: Self-pay | Admitting: Family Medicine

## 2014-09-29 ENCOUNTER — Other Ambulatory Visit: Payer: Self-pay | Admitting: Family Medicine

## 2014-10-18 ENCOUNTER — Other Ambulatory Visit: Payer: Self-pay | Admitting: Family Medicine

## 2014-10-18 DIAGNOSIS — E785 Hyperlipidemia, unspecified: Secondary | ICD-10-CM

## 2014-10-18 DIAGNOSIS — Z0000 Encounter for general adult medical examination without abnormal findings: Secondary | ICD-10-CM

## 2014-10-23 ENCOUNTER — Other Ambulatory Visit (INDEPENDENT_AMBULATORY_CARE_PROVIDER_SITE_OTHER): Payer: Self-pay

## 2014-10-23 DIAGNOSIS — Z0000 Encounter for general adult medical examination without abnormal findings: Secondary | ICD-10-CM

## 2014-10-23 DIAGNOSIS — E785 Hyperlipidemia, unspecified: Secondary | ICD-10-CM

## 2014-10-23 LAB — CBC WITH DIFFERENTIAL/PLATELET
Basophils Absolute: 0 10*3/uL (ref 0.0–0.1)
Basophils Relative: 0.3 % (ref 0.0–3.0)
EOS ABS: 0.2 10*3/uL (ref 0.0–0.7)
Eosinophils Relative: 2.2 % (ref 0.0–5.0)
HCT: 40.1 % (ref 36.0–46.0)
HEMOGLOBIN: 13.4 g/dL (ref 12.0–15.0)
LYMPHS ABS: 4.4 10*3/uL — AB (ref 0.7–4.0)
Lymphocytes Relative: 43.9 % (ref 12.0–46.0)
MCHC: 33.5 g/dL (ref 30.0–36.0)
MCV: 84.5 fl (ref 78.0–100.0)
Monocytes Absolute: 0.5 10*3/uL (ref 0.1–1.0)
Monocytes Relative: 5.3 % (ref 3.0–12.0)
NEUTROS ABS: 4.8 10*3/uL (ref 1.4–7.7)
Neutrophils Relative %: 48.3 % (ref 43.0–77.0)
PLATELETS: 234 10*3/uL (ref 150.0–400.0)
RBC: 4.74 Mil/uL (ref 3.87–5.11)
RDW: 13.8 % (ref 11.5–15.5)
WBC: 9.9 10*3/uL (ref 4.0–10.5)

## 2014-10-23 LAB — COMPREHENSIVE METABOLIC PANEL
ALBUMIN: 3.7 g/dL (ref 3.5–5.2)
ALT: 15 U/L (ref 0–35)
AST: 22 U/L (ref 0–37)
Alkaline Phosphatase: 87 U/L (ref 39–117)
BILIRUBIN TOTAL: 0.4 mg/dL (ref 0.2–1.2)
BUN: 8 mg/dL (ref 6–23)
CALCIUM: 9.4 mg/dL (ref 8.4–10.5)
CO2: 26 mEq/L (ref 19–32)
Chloride: 102 mEq/L (ref 96–112)
Creatinine, Ser: 0.72 mg/dL (ref 0.40–1.20)
GFR: 95.65 mL/min (ref >60.00)
Glucose, Bld: 113 mg/dL — ABNORMAL HIGH (ref 70–99)
POTASSIUM: 4 meq/L (ref 3.5–5.1)
Sodium: 137 mEq/L (ref 135–145)
TOTAL PROTEIN: 7.5 g/dL (ref 6.0–8.3)

## 2014-10-23 LAB — LIPID PANEL
Cholesterol: 134 mg/dL (ref 0–200)
HDL: 55.4 mg/dL (ref >39.00)
LDL Cholesterol: 49 mg/dL (ref 0–99)
NONHDL: 78.6
TRIGLYCERIDES: 147 mg/dL (ref 0.0–149.0)
Total CHOL/HDL Ratio: 2
VLDL: 29.4 mg/dL (ref 0.0–40.0)

## 2014-10-23 LAB — HEMOGLOBIN A1C: Hgb A1c MFr Bld: 5.8 % (ref 4.6–6.5)

## 2014-10-23 LAB — TSH: TSH: 4.9 u[IU]/mL — ABNORMAL HIGH (ref 0.35–4.50)

## 2014-10-24 ENCOUNTER — Other Ambulatory Visit: Payer: Self-pay | Admitting: Family Medicine

## 2014-10-25 ENCOUNTER — Telehealth: Payer: Self-pay

## 2014-10-25 NOTE — Telephone Encounter (Signed)
Pt has upcoming appt with Dr Dayton MartesAron but needs refill metformin and lipitor prior to office visit. [facility] and both meds have refill on hold and Su LeyShamira will get ready for pick up. Pt advised and will pick up med at pharmacy.

## 2014-11-01 ENCOUNTER — Ambulatory Visit (INDEPENDENT_AMBULATORY_CARE_PROVIDER_SITE_OTHER): Payer: BC Managed Care – PPO | Admitting: Family Medicine

## 2014-11-01 ENCOUNTER — Other Ambulatory Visit: Payer: Self-pay | Admitting: Family Medicine

## 2014-11-01 ENCOUNTER — Encounter: Payer: Self-pay | Admitting: Family Medicine

## 2014-11-01 ENCOUNTER — Other Ambulatory Visit (HOSPITAL_COMMUNITY)
Admission: RE | Admit: 2014-11-01 | Discharge: 2014-11-01 | Disposition: A | Discharge status: Home / Self Care | Payer: BC Managed Care – PPO | Source: Ambulatory Visit | Attending: Family Medicine | Admitting: Family Medicine

## 2014-11-01 VITALS — BP 138/94 | HR 78 | Temp 98.4°F | Ht 62.75 in | Wt 272.2 lb

## 2014-11-01 DIAGNOSIS — R000 Tachycardia, unspecified: Secondary | ICD-10-CM

## 2014-11-01 DIAGNOSIS — Z01419 Encounter for gynecological examination (general) (routine) without abnormal findings: Secondary | ICD-10-CM | POA: Diagnosis not present

## 2014-11-01 DIAGNOSIS — Z1151 Encounter for screening for human papillomavirus (HPV): Secondary | ICD-10-CM | POA: Insufficient documentation

## 2014-11-01 DIAGNOSIS — G4700 Insomnia, unspecified: Secondary | ICD-10-CM | POA: Diagnosis not present

## 2014-11-01 DIAGNOSIS — J4530 Mild persistent asthma, uncomplicated: Secondary | ICD-10-CM

## 2014-11-01 DIAGNOSIS — Z8719 Personal history of other diseases of the digestive system: Secondary | ICD-10-CM

## 2014-11-01 DIAGNOSIS — Z0000 Encounter for general adult medical examination without abnormal findings: Secondary | ICD-10-CM | POA: Insufficient documentation

## 2014-11-01 DIAGNOSIS — E785 Hyperlipidemia, unspecified: Secondary | ICD-10-CM

## 2014-11-01 DIAGNOSIS — K219 Gastro-esophageal reflux disease without esophagitis: Secondary | ICD-10-CM

## 2014-11-01 DIAGNOSIS — R946 Abnormal results of thyroid function studies: Secondary | ICD-10-CM | POA: Diagnosis not present

## 2014-11-01 DIAGNOSIS — M722 Plantar fascial fibromatosis: Secondary | ICD-10-CM | POA: Diagnosis not present

## 2014-11-01 DIAGNOSIS — R7309 Other abnormal glucose: Secondary | ICD-10-CM

## 2014-11-01 DIAGNOSIS — R7989 Other specified abnormal findings of blood chemistry: Secondary | ICD-10-CM

## 2014-11-01 DIAGNOSIS — E669 Obesity, unspecified: Secondary | ICD-10-CM

## 2014-11-01 LAB — T3, FREE: T3 FREE: 3.6 pg/mL (ref 2.3–4.2)

## 2014-11-01 LAB — TSH: TSH: 3.35 u[IU]/mL (ref 0.35–4.50)

## 2014-11-01 LAB — T4, FREE: Free T4: 0.76 ng/dL (ref 0.60–1.60)

## 2014-11-01 MED ORDER — YASMIN 28 3-0.03 MG PO TABS: 1.0000 | ORAL_TABLET | Freq: Every day | ORAL | Status: DC

## 2014-11-01 MED ORDER — NITROGLYCERIN 0.4 % RE OINT: TOPICAL_OINTMENT | RECTAL | Status: DC

## 2014-11-01 NOTE — Telephone Encounter (Signed)
Pt was here today for CPE. Ok to fill?

## 2014-11-01 NOTE — Assessment & Plan Note (Signed)
Symptoms controlled with Bystolic. No changes made today.

## 2014-11-01 NOTE — Addendum Note (Signed)
Addended by: Desmond DikeKNIGHT, Fallen Crisostomo H on: 11/01/2014 01:51 PM   Modules accepted: Orders

## 2014-11-01 NOTE — Assessment & Plan Note (Signed)
Stable on current dose of Metformin. No changes made today.

## 2014-11-01 NOTE — Patient Instructions (Addendum)
Please make an appointment with Dr. Patsy Lageropland on your way out.  Have a good rest of your summer!

## 2014-11-01 NOTE — Assessment & Plan Note (Addendum)
Reviewed preventive care protocols, scheduled due services, and updated immunizations Discussed nutrition, exercise, diet, and healthy lifestyle.  Pap smear done today. 

## 2014-11-01 NOTE — Progress Notes (Signed)
Subjective:    Patient ID: [name], female    DOB: 1975/12/14, 39 y.o.   MRN: [id number]  HPI  39 yo female here for CPX and follow up of chronic medical conditions.  Had GYN- overdue for pap smear. She would like for me to take over GYN care. On Yasmin for OCPs.  She is pleased with this.  Elevated TSH- she has notice more constipation and weight gain recently. Lab Results  Component Value Date   TSH 4.90* 10/23/2014   More issues with her plantar fascitis pain.  She seems to have more symptoms with flat shoes.  Prediabetes- taking Metformin 500 mg daily. Denies GI side effects currently. Lab Results  Component Value Date   HGBA1C 5.8 10/23/2014   Palpitations have been controlled with Bystolic.  HLD-  On Lipitor 20 mg daily.  Very strong FH of HLD and hypertriglyceridemia. She also has a personal history of very high triglycerides in past (over 1000) Lab Results  Component Value Date   CHOL 134 10/23/2014   HDL 55.40 10/23/2014   LDLCALC 49 10/23/2014   LDLDIRECT 60.0 03/02/2014   TRIG 147.0 10/23/2014   CHOLHDL 2 10/23/2014   HTN- BP well controlled Bystolic.  If she skips a dose, feels her heart racing. Lab Results  Component Value Date   CREATININE 0.72 10/23/2014   Lab Results  Component Value Date   WBC 9.9 10/23/2014   HGB 13.4 10/23/2014   HCT 40.1 10/23/2014   MCV 84.5 10/23/2014   PLT 234.0 10/23/2014   Lab Results  Component Value Date   ALT 15 10/23/2014   AST 22 10/23/2014   ALKPHOS 87 10/23/2014   BILITOT 0.4 10/23/2014   Lab Results  Component Value Date   NA 137 10/23/2014   K 4.0 10/23/2014   CL 102 10/23/2014   CO2 26 10/23/2014      Anal fissures- sees Dr. Elnoria HowardHung.  Uses Nitroglycerin ointment which has been successful.  Asthma- symptoms are finally now well controlled. Patient Active Problem List   Diagnosis Date Noted  . Well woman exam 11/01/2014  . History of anal fissures 11/01/2014  . Elevated hemoglobin A1c  10/20/2013  . Insomnia 08/12/2013  . Obesity 02/27/2011  . Tachycardia 12/25/2010  . HLD (hyperlipidemia) 12/15/2006  . ALLERGIC RHINITIS 12/15/2006  . Asthma 12/15/2006  . GERD 12/15/2006   Past Medical History  Diagnosis Date  . Acne   . Allergic rhinitis   . Asthma   . HLD (hyperlipidemia)   . Chronic bronchitis   . Seasonal allergies   . GERD (gastroesophageal reflux disease)   . Blood in stool    Past Surgical History  Procedure Laterality Date  . Cholecystectomy    . Tonsillectomy    . Hemorroidectomy     History  Substance Use Topics  . Smoking status: Never Smoker   . Smokeless tobacco: Not on file  . Alcohol Use: Yes     Comment: Very rare; mixed drink   Family History  Problem Relation Age of Onset  . Arthritis Other     Family hx  . Diabetes Other     1st degree relative  . Hypertension Other     Family hx  . Stroke Other     1st degree relative < 50  . Other      Cardiovascular disorder--Family hx  . Heart attack Father    Allergies  Allergen Reactions  . Levalbuterol Shortness Of Breath  . Buspar [Buspirone Hcl]  Fainting feelings   . Sulfamethoxazole     REACTION: unspecified  . Sulfonamide Derivatives     REACTION: rash   Current Outpatient Prescriptions on File Prior to Visit  Medication Sig Dispense Refill  . BYSTOLIC 10 MG tablet TAKE 1 TABLET BY MOUTH EVERY DAY 30 tablet 5  . cetirizine (ZYRTEC) 10 MG tablet Take 10 mg by mouth daily.      . Cimetidine (TAGAMET PO) Take by mouth as needed.      . diazepam (VALIUM) 2 MG tablet TAKE 1 TABLET BY MOUTH EVERY 8 HOURS 30 tablet 0  . docusate sodium (COLACE) 100 MG capsule Take 200 mg by mouth at bedtime.      Marland Kitchen LIPITOR 20 MG tablet TAKE 1 TABLET BY MOUTH DAILY 30 tablet 0  . metFORMIN (GLUCOPHAGE) 500 MG tablet TAKE 1 TABLET (500 MG TOTAL) BY MOUTH DAILY WITH BREAKFAST. 30 tablet 0  . mometasone-formoterol (DULERA) 100-5 MCG/ACT AERO INHALE 2 PUFFS INTO THE LUNGS 2 (TWO)  TIMES DAILY  13 g 11  . Nitroglycerin 0.4 % OINT Apply rectally three times daily as needed 1 Tube 3  . omeprazole (PRILOSEC OTC) 20 MG tablet Take 20 mg by mouth as needed.     . Probiotic Product (ALIGN) 4 MG CAPS Take 1 capsule by mouth daily.      Marland Kitchen SINGULAIR 10 MG tablet TAKE 1 TABLET BY MOUTH AT BEDTIME 30 tablet 5  . XOPENEX 0.63 MG/3ML nebulizer solution USE 1 VIAL EVERY 4 HRS AS NEEDED FOR WHEEZING 72 mL 0  . XOPENEX HFA 45 MCG/ACT inhaler INHALE 1 PUFF INTO THE LUNGS EVERY 4 (FOUR) HOURS AS NEEDED FOR WHEEZING. 1 Inhaler 2  . YASMIN 28 3-0.03 MG tablet TAKE 1 TABLET BY MOUTH DAILY. 28 tablet 0   No current facility-administered medications on file prior to visit.   The PMH, PSH, Social History, Family History, Medications, and allergies have been reviewed in [facility], and have been updated if relevant.   Review of Systems   Review of Systems  Constitutional: Positive for unexpected weight change.  HENT: Negative.   Eyes: Negative.   Respiratory: Negative.   Cardiovascular: Negative.   Gastrointestinal: Positive for constipation.  Endocrine: Negative.   Genitourinary: Negative.   Musculoskeletal: Positive for myalgias.  Skin: Negative.   Allergic/Immunologic: Negative.   Neurological: Negative.   Hematological: Negative.   Psychiatric/Behavioral: Negative.   All other systems reviewed and are negative.    Objective:   Physical Exam   BP 138/94 mmHg  Pulse 78  Temp(Src) 98.4 F (36.9 C) (Oral)  Ht 5' 2.75" (1.594 m)  Wt 272 lb 4 oz (123.492 kg)  BMI 48.60 kg/m2  SpO2 97%  LMP 10/02/2014 (Within Days)  Wt Readings from Last 3 Encounters:  11/01/14 272 lb 4 oz (123.492 kg)  10/20/13 264 lb 8 oz (119.976 kg)  08/12/13 262 lb 8 oz (119.069 kg)    Physical Exam   General:  Well-developed,well-nourished,in no acute distress; alert,appropriate and cooperative throughout examination Head:  normocephalic and atraumatic.   Eyes:  vision grossly intact, pupils equal, pupils  round, and pupils reactive to light.   Ears:  R ear normal and L ear normal.   Nose:  no external deformity.   Mouth:  good dentition.   Neck:  No deformities, masses, or tenderness noted. Breasts:  No mass, nodules, thickening, tenderness, bulging, retraction, inflamation, nipple discharge or skin changes noted.   Lungs:  Normal respiratory effort, chest expands symmetrically.  Lungs are clear to auscultation, no crackles or wheezes. Heart:  Normal rate and regular rhythm. S1 and S2 normal without gallop, murmur, click, rub or other extra sounds. Abdomen:  Bowel sounds positive,abdomen soft and non-tender without masses, organomegaly or hernias noted. Rectal:  no external abnormalities.   Genitalia:  Pelvic Exam:        External: normal female genitalia without lesions or masses        Vagina: normal without lesions or masses        Cervix: normal without lesions or masses        Adnexa: normal bimanual exam without masses or fullness        Uterus: normal by palpation        Pap smear: performed Msk:  No deformity or scoliosis noted of thoracic or lumbar spine.   Extremities:  No clubbing, cyanosis, edema, or deformity noted with normal full range of motion of all joints.   Neurologic:  alert & oriented X3 and gait normal.   Skin:  Intact without suspicious lesions or rashes Cervical Nodes:  No lymphadenopathy noted Axillary Nodes:  No palpable lymphadenopathy Psych:  Cognition and judgment appear intact. Alert and cooperative with normal attention span and concentration. No apparent delusions, illusions, hallucinations

## 2014-11-01 NOTE — Progress Notes (Signed)
Pre visit review using our clinic review tool, if applicable. No additional management support is needed unless otherwise documented below in the visit note. 

## 2014-11-01 NOTE — Assessment & Plan Note (Signed)
Symptoms controlled with prn NTG. No changes made today.

## 2014-11-01 NOTE — Telephone Encounter (Signed)
Pt needs nitroglycerin called into [facility]. Pt is out of the medication, thanks. Call back number [phone]

## 2014-11-01 NOTE — Assessment & Plan Note (Signed)
Well controlled on current dose of lipitor. No changes made today. 

## 2014-11-01 NOTE — Assessment & Plan Note (Signed)
New- thyroid panel today. Orders Placed This Encounter  Procedures  . TSH  . T4, Free  . T3, Free

## 2014-11-02 ENCOUNTER — Encounter: Payer: Self-pay | Admitting: *Deleted

## 2014-11-03 ENCOUNTER — Encounter: Payer: Self-pay | Admitting: *Deleted

## 2014-11-03 LAB — CYTOLOGY - PAP

## 2014-11-13 ENCOUNTER — Encounter: Payer: Self-pay | Admitting: Family Medicine

## 2014-11-13 ENCOUNTER — Ambulatory Visit (INDEPENDENT_AMBULATORY_CARE_PROVIDER_SITE_OTHER): Payer: BC Managed Care – PPO | Admitting: Family Medicine

## 2014-11-13 VITALS — BP 134/90 | HR 75 | Temp 98.4°F | Ht 62.75 in | Wt 277.8 lb

## 2014-11-13 DIAGNOSIS — M722 Plantar fascial fibromatosis: Secondary | ICD-10-CM | POA: Diagnosis not present

## 2014-11-13 NOTE — Patient Instructions (Signed)
Excellent Over the Counter Orthotics:  Hapad: available at www.hapad.com (Green Sports Insoles)  SPENCO: Available at some sports stores or www.amazon.com. (I prefer the full-length green orthotic that has a yellow bottom / base)  www.pedifix.com and "[employer]" website: multiple good foot products  SHOES: Danskos, Merrells, Keens, Clarks, Birkenstocks - good arch support, want minimal bendability. Finn Comfort also excellent, but even more expensive.  Tennis Shoes / Running Shoes: Many good companies and styles. The most important thing is to get a good fit and wear a shoe that feels good in the store. Walk around in the store. Run if that is something that you can do. Some of the running stores have a treadmill, also.  Brooks, New Balance, Saucony are good, but the most important thing is to wear a shoe that fits your foot and feels good.  [employer] in [place]: excellent staff, shoe selection (Running and Athletic Shoes), OTC orthotics. On [address] across from the [employer]. For running shoe and athletic shoe fit, they are the best.  Nicer shoes:  Birkenstock shoes, [place], [place]: Excellent selection  [employer], [address] [place], [place]: They have the largest selection of comfort and supportive shoes that I have ever seen, and their staff is generally quite good in fitting you for shoes. (They will intermittently have sales, mail coupons, and you can look on their website.)  [employer]: [address], [place], [place]    

## 2014-11-13 NOTE — Progress Notes (Signed)
Pre visit review using our clinic review tool, if applicable. No additional management support is needed unless otherwise documented below in the visit note. 

## 2014-11-13 NOTE — Progress Notes (Signed)
Dr. Karleen Hampshire T. Imanuel Pruiett, MD, CAQ Sports Medicine Primary Care and Sports Medicine [address] [place], [address] Phone: [phone] Fax: [phone]  11/13/2014  Patient: [name], MRN: [id number], DOB: 1975-11-01, 39 y.o.  Primary Physician:  Ruthe Mannan, MD  Chief Complaint: Foot Pain  Subjective:   This 39 y.o. female patient presents with a several long history of R heel pain. She is really having pain or discomfort more in the midportion of the plantar fascia, and she is not really having any heel pain or minimal heel pain on the right.  R > L, heel pain Wants some shoes suggestions  ? Pain with the first few steps None of shoes are   Prior foot or ankle fractures: none Prior operations: none Orthotics or bracing: none Medications: none PT or home rehab: none Night splints: no Ice massage: no Ball massage: no  Metatarsal pain: no  The PMH, PSH, Social History, Family History, Medications, and allergies have been reviewed in [facility], and have been updated if relevant.  Patient Active Problem List   Diagnosis Date Noted  . Well woman exam 11/01/2014  . History of anal fissures 11/01/2014  . Plantar fasciitis 11/01/2014  . Elevated TSH 11/01/2014  . Elevated hemoglobin A1c 10/20/2013  . Insomnia 08/12/2013  . Obesity 02/27/2011  . Tachycardia 12/25/2010  . HLD (hyperlipidemia) 12/15/2006  . ALLERGIC RHINITIS 12/15/2006  . Asthma 12/15/2006  . GERD 12/15/2006    Past Medical History  Diagnosis Date  . Acne   . Allergic rhinitis   . Asthma   . HLD (hyperlipidemia)   . Chronic bronchitis   . Seasonal allergies   . GERD (gastroesophageal reflux disease)   . Blood in stool     Past Surgical History  Procedure Laterality Date  . Cholecystectomy    . Tonsillectomy    . Hemorroidectomy      History   Social History  . Marital Status: Married    Spouse Name: N/A  . Number of Children: N/A  . Years of Education: N/A   Occupational  History  . Not on file.   Social History Main Topics  . Smoking status: Never Smoker   . Smokeless tobacco: Never Used  . Alcohol Use: 0.0 oz/week    0 Standard drinks or equivalent per week     Comment: Very rare; mixed drink  . Drug Use: No  . Sexual Activity: Yes    Birth Control/ Protection: Pill   Other Topics Concern  . Not on file   Social History Narrative   No regular exercise    Family History  Problem Relation Age of Onset  . Arthritis Other     Family hx  . Diabetes Other     1st degree relative  . Hypertension Other     Family hx  . Stroke Other     1st degree relative < 50  . Other      Cardiovascular disorder--Family hx  . Heart attack Father     Allergies  Allergen Reactions  . Levalbuterol Shortness Of Breath  . Buspar [Buspirone Hcl]      Fainting feelings   . Sulfamethoxazole     REACTION: unspecified  . Sulfonamide Derivatives     REACTION: rash    Medication list reviewed and updated in full in [facility].  GEN: No fevers, chills. Nontoxic. Primarily MSK c/o today. MSK: Detailed in the HPI GI: tolerating PO intake without difficulty Neuro: No numbness,  parasthesias, or tingling associated. Otherwise the pertinent positives of the ROS are noted above.   Objective:   Blood pressure 134/90, pulse 75, temperature 98.4 F (36.9 C), temperature source Oral, height 5' 2.75" (1.594 m), weight 277 lb 12 oz (125.987 kg), last menstrual period 10/29/2014.  GEN: Well-developed,well-nourished,in no acute distress; alert,appropriate and cooperative throughout examination HEENT: Normocephalic and atraumatic without obvious abnormalities. Ears, externally no deformities PULM: Breathing comfortably in no respiratory distress EXT: No clubbing, cyanosis, or edema PSYCH: Normally interactive. Cooperative during the interview. Pleasant. Friendly and conversant. Not anxious or depressed appearing. Normal, full affect.  Echymosis: no Edema:  no ROM: full LE B Gait: heel toe, non-antalgic MT pain: no Callus pattern: none Lateral Mall: NT Medial Mall: NT Talus: NT Navicular: NT Calcaneous: NT Metatarsals: NT 5th MT: NT Phalanges: NT Achilles: NT Plantar Fascia: minimal heel tenderness Fat Pad: NT Peroneals: NT Post Tib: NT Great Toe: Nml motion Ant Drawer: neg Other foot breakdown: none Long arch: Mild to moderate breakdown bilaterally  Transverse arch: Extensive large breakdown Hindfoot breakdown: none Sensation: intact  Assessment and Plan:   Plantar fasciitis  Body mass index is 49.58 kg/(m^2).   >25 minutes spent in face to face time with patient, >50% spent in counselling or coordination of care  This is not truly plantar fasciitis, but more an overuse of the midportion of the plantar fascia when her foot becomes tired. She is getting some discomfort in her instep. She does pronate some with walking. Fatigue and weight are likely playing a role when she is up on her feet a lot.  I gave her an arch binders and reviewed some basic plantar fascia stretches. I think that supportive shoes would likely help more than anything. She has been extremely wide foot, so I going to make some recommendations for foot wear.  Patient Instructions  Excellent Over the Counter Orthotics:  Hapad: available at www.hapad.com (Green Sports Insoles)  SPENCO: Available at some sports stores or GoalForum.com.au. (I prefer the full-length green orthotic that has a yellow bottom / base)  www.pedifix.com and Financial controller Medicine" website: multiple good foot products  SHOES: Danskos, Merrells, Keens, Clarks, Birkenstocks - good arch support, want minimal bendability. Finn Comfort also excellent, but even more expensive.  Tennis Shoes / Running Shoes: Many good companies and styles. The most important thing is to get a good fit and wear a shoe that feels good in the store. Walk around in the store. Run if that is something that you  can do. Some of the running stores have a treadmill, also.  Brooks, New Balance, Andrena Mews are good, but the most important thing is to wear a shoe that fits your foot and feels good.  [employer] Running in [place]: excellent staff, Armed forces technical officer (Running and [employer]), OTC orthotics. On [address] across from the [employer]. For running shoe and athletic shoe fit, they are the best.  Nicer shoes:  Birkenstock shoes, [employer], [place]: Excellent selection  [employer], [address] [place], [place]: They have the largest selection of comfort and supportive shoes that I have ever seen, and their staff is generally quite good in fitting you for shoes. (They will intermittently have sales, mail coupons, and you can look on their website.)  [employer]: [address] [place], [place]        Signed,  [place] T. Ladarrion Telfair, MD   Patient's Medications  New Prescriptions   No medications on file  Previous Medications   BYSTOLIC 10 MG TABLET    TAKE 1 TABLET BY MOUTH EVERY DAY   CETIRIZINE (ZYRTEC) 10 MG TABLET    Take 10 mg by mouth daily.     CIMETIDINE (TAGAMET PO)    Take by mouth as needed.     DIAZEPAM (VALIUM) 2 MG TABLET    TAKE 1 TABLET BY MOUTH EVERY 8 HOURS   DOCUSATE SODIUM (COLACE) 100 MG CAPSULE    Take 200 mg by mouth at bedtime.     LIPITOR 20 MG TABLET    TAKE 1 TABLET BY MOUTH DAILY   METFORMIN (GLUCOPHAGE) 500 MG TABLET    TAKE 1 TABLET (500 MG TOTAL) BY MOUTH DAILY WITH BREAKFAST.   MOMETASONE-FORMOTEROL (DULERA) 100-5 MCG/ACT AERO    INHALE 2 PUFFS INTO THE LUNGS 2 (TWO)  TIMES DAILY   NITROGLYCERIN 0.4 % OINT    Apply rectally three times daily as needed   OMEPRAZOLE (PRILOSEC OTC) 20 MG TABLET    Take 20 mg by mouth as needed.    PROBIOTIC PRODUCT (ALIGN) 4 MG CAPS    Take 1 capsule by mouth daily.     SINGULAIR 10 MG TABLET    TAKE 1 TABLET BY MOUTH AT BEDTIME   XOPENEX 0.63 MG/3ML NEBULIZER SOLUTION    USE 1 VIAL EVERY 4 HRS AS NEEDED FOR  WHEEZING   XOPENEX HFA 45 MCG/ACT INHALER    INHALE 1 PUFF INTO THE LUNGS EVERY 4 (FOUR) HOURS AS NEEDED FOR WHEEZING.   YASMIN 28 3-0.03 MG TABLET    Take 1 tablet by mouth daily.  Modified Medications   No medications on file  Discontinued Medications   No medications on file

## 2014-11-20 ENCOUNTER — Other Ambulatory Visit: Payer: Self-pay | Admitting: Family Medicine

## 2014-11-26 ENCOUNTER — Other Ambulatory Visit: Payer: Self-pay | Admitting: Family Medicine

## 2014-11-27 NOTE — Telephone Encounter (Signed)
Rx called in to requested pharmacy 

## 2014-11-27 NOTE — Telephone Encounter (Signed)
Pt has not been on requested med since 06/2014

## 2014-12-20 ENCOUNTER — Other Ambulatory Visit: Payer: Self-pay | Admitting: Family Medicine

## 2014-12-31 ENCOUNTER — Other Ambulatory Visit: Payer: Self-pay | Admitting: Family Medicine

## 2015-01-22 ENCOUNTER — Telehealth: Payer: Self-pay | Admitting: *Deleted

## 2015-01-22 MED ORDER — ALBUTEROL SULFATE (2.5 MG/3ML) 0.083% IN NEBU: 2.5000 mg | INHALATION_SOLUTION | Freq: Four times a day (QID) | RESPIRATORY_TRACT | Status: DC | PRN

## 2015-01-22 NOTE — Telephone Encounter (Signed)
Patient has recently had cold symptoms with asthma flare.  She is using Xopenex via nebulizer with no relief.  Patient would like to try going back to albuterol.  Okay to send new Rx of albuterol to [facility]?  Please advise.

## 2015-01-22 NOTE — Telephone Encounter (Signed)
Rx sent to requested pharmacy

## 2015-01-22 NOTE — Telephone Encounter (Signed)
Yes ok to send rx as requested.

## 2015-01-26 ENCOUNTER — Telehealth: Payer: Self-pay | Admitting: Family Medicine

## 2015-01-26 ENCOUNTER — Encounter: Payer: Self-pay | Admitting: Internal Medicine

## 2015-01-26 ENCOUNTER — Ambulatory Visit: Payer: BC Managed Care – PPO | Admitting: Internal Medicine

## 2015-01-26 VITALS — BP 134/86 | HR 78 | Temp 98.5°F | Wt 278.0 lb

## 2015-01-26 DIAGNOSIS — J4531 Mild persistent asthma with (acute) exacerbation: Secondary | ICD-10-CM | POA: Diagnosis not present

## 2015-01-26 MED ORDER — METHYLPREDNISOLONE ACETATE 80 MG/ML IJ SUSP
80.0000 mg | Freq: Once | INTRAMUSCULAR | Status: AC
  Administered 2015-01-26: 80 mg via INTRAMUSCULAR

## 2015-01-26 MED ORDER — AZITHROMYCIN 250 MG PO TABS: ORAL_TABLET | ORAL | Status: DC

## 2015-01-26 NOTE — Patient Instructions (Signed)

## 2015-01-26 NOTE — Telephone Encounter (Signed)
Pt is requesting an new nebulizer.  The last nebulizer she got was more than 5 years ago.  cb number is [phone].

## 2015-01-26 NOTE — Addendum Note (Signed)
Addended by: Roena MaladyEVONTENNO, Zuriel Roskos Y on: 01/26/2015 04:38 PM   Modules accepted: Orders

## 2015-01-26 NOTE — Progress Notes (Signed)
HPI  Pt presents to the clinic today with c/o cough, wheezing and shortness of breath. She reports this started 1 week ago. The cough is productive of yellow mucous. She denies fever, chills or body aches. She denies runny nose, nasal congestion or sore throat. She does have a history of allergies and asthma. She takes Zyrtec and Dulera as prescribed. She has been using Albuterol and nebulizer as needed with minimal relief.  Review of Systems      Past Medical History  Diagnosis Date  . Acne   . Allergic rhinitis   . Asthma   . HLD (hyperlipidemia)   . Chronic bronchitis   . Seasonal allergies   . GERD (gastroesophageal reflux disease)   . Blood in stool     Family History  Problem Relation Age of Onset  . Arthritis Other     Family hx  . Diabetes Other     1st degree relative  . Hypertension Other     Family hx  . Stroke Other     1st degree relative < 50  . Other      Cardiovascular disorder--Family hx  . Heart attack Father     Social History   Social History  . Marital Status: Married    Spouse Name: N/A  . Number of Children: N/A  . Years of Education: N/A   Occupational History  . Not on file.   Social History Main Topics  . Smoking status: Never Smoker   . Smokeless tobacco: Never Used  . Alcohol Use: 0.0 oz/week    0 Standard drinks or equivalent per week     Comment: Very rare; mixed drink  . Drug Use: No  . Sexual Activity: Yes    Birth Control/ Protection: Pill   Other Topics Concern  . Not on file   Social History Narrative   No regular exercise    Allergies  Allergen Reactions  . Levalbuterol Shortness Of Breath  . Buspar [Buspirone Hcl]      Fainting feelings   . Sulfamethoxazole     REACTION: unspecified  . Sulfonamide Derivatives     REACTION: rash     Constitutional: Denies headache, fatigue, fever or abrupt weight changes.  HEENT:  Denies eye redness, eye pain, pressure behind the eyes, facial pain, nasal congestion, ear  pain, ringing in the ears, wax buildup, runny nose or sore throat. Respiratory: Positive cough and shortness of breath. Denies difficulty breathing.  Cardiovascular: Denies chest pain, chest tightness, palpitations or swelling in the hands or feet.   No other specific complaints in a complete review of systems (except as listed in HPI above).  Objective:   BP 134/86 mmHg  Pulse 78  Temp(Src) 98.5 F (36.9 C) (Oral)  Wt 278 lb (126.1 kg)  SpO2 98%  LMP 01/26/2015 Wt Readings from Last 3 Encounters:  01/26/15 278 lb (126.1 kg)  11/13/14 277 lb 12 oz (125.987 kg)  11/01/14 272 lb 4 oz (123.492 kg)     General: Appears her stated age, obese in NAD. HEENT: Head: normal shape and size, no sinus tenderness noted; Eyes: sclera white, no icterus, conjunctiva pink; Ears: Tm's gray and intact, normal light reflex, + serous effusion; Nose: mucosa boggy and moist, septum midline; Throat/Mouth:  Teeth present, mucosa pink and moist, no exudate noted, no lesions or ulcerations noted.  Neck: No cervical lymphadenopathy.  Cardiovascular: Normal rate and rhythm. S1,S2 noted.  No murmur, rubs or gallops noted.  Pulmonary/Chest: Normal effort and  positive vesicular breath sounds with intermittent expiratory wheeze noted. No respiratory distress. No rales or ronchi noted.      Assessment & Plan:   Upper Respiratory Infection:  Get some rest and drink plenty of water eRx for Azithromax x 5 days Continue Zyrtec, Dulera and Albuterol 80 mg Depo IM today  RTC as needed or if symptoms persist.

## 2015-01-26 NOTE — Progress Notes (Signed)
Pre visit review using our clinic review tool, if applicable. No additional management support is needed unless otherwise documented below in the visit note. 

## 2015-01-29 NOTE — Telephone Encounter (Signed)
Does this need to be a written rx?

## 2015-01-29 NOTE — Telephone Encounter (Signed)
i attempted to contact pt. There is a way to order in epic under DME

## 2015-03-19 ENCOUNTER — Other Ambulatory Visit: Payer: Self-pay | Admitting: Family Medicine

## 2015-04-19 ENCOUNTER — Encounter: Payer: Self-pay | Admitting: Family Medicine

## 2015-04-19 ENCOUNTER — Ambulatory Visit (INDEPENDENT_AMBULATORY_CARE_PROVIDER_SITE_OTHER): Payer: BC Managed Care – PPO | Admitting: Family Medicine

## 2015-04-19 VITALS — BP 132/88 | HR 88 | Temp 99.1°F | Ht 62.75 in | Wt 278.0 lb

## 2015-04-19 DIAGNOSIS — J4521 Mild intermittent asthma with (acute) exacerbation: Secondary | ICD-10-CM | POA: Diagnosis not present

## 2015-04-19 DIAGNOSIS — J45901 Unspecified asthma with (acute) exacerbation: Secondary | ICD-10-CM | POA: Insufficient documentation

## 2015-04-19 MED ORDER — METHYLPREDNISOLONE ACETATE 80 MG/ML IJ SUSP
80.0000 mg | Freq: Once | INTRAMUSCULAR | Status: AC
  Administered 2015-04-19: 80 mg via INTRAMUSCULAR

## 2015-04-19 NOTE — Progress Notes (Signed)
Pre visit review using our clinic review tool, if applicable. No additional management support is needed unless otherwise documented below in the visit note. 

## 2015-04-19 NOTE — Assessment & Plan Note (Addendum)
Exam and symptoms are most consistent with viral upper respiratory infection and reactive airway disease. Given duration of symptoms and lack of focality on exam suspect this is viral in nature. She does have wheezes. She has an albuterol nebulizer at home. She is given Depo-Medrol IM 80 mg today in the office as she states she cannot tolerate the constipation that comes with by mouth prednisone. Doubt pneumonia given lack of focal findings and stable vital signs. Patient is well-appearing and has no increased work of breathing. She is stable for outpatient treatment. Do not feel antibiotics would be useful given likely viral nature of this illness. She will continue to use her nebulizer machine every 4 hours for the next 2 days. She is given return precautions.

## 2015-04-19 NOTE — Patient Instructions (Signed)
Nice to meet you. We have given use steroids today to help with your asthma exacerbation. You should continue to use her albuterol nebulizer treatment every 4 hours for the next 2 days. Then he can use it as needed. If you develop cough productive of green/white sputum, cough productive of blood, chest pain, shortness of breath, fevers, or any new or changing symptoms please seek medical attention.

## 2015-04-19 NOTE — Progress Notes (Signed)
Patient ID: Cordelia PenMcKenzie Dehaan, female   DOB: 1975-07-06, 40 y.o.   MRN: [id number]  Marikay AlarEric Jonathandavid Marlett, MD Phone: [phone]  Cordelia PenMcKenzie Gu is a 40 y.o. female who presents today for same-day appointment.  URI and asthma exacerbation: Patient notes for the last 3-4 days she has had rhinorrhea and postnasal drip along with cough and wheezing. Cough is productive of clear sputum. She is blowing some clear mucus out of her nose. She's not had any documented fevers. She's been using her albuterol nebulizer treatment at home 4 times a day. Her dulera has been empty, though she notes she does have a refill. She denies chest pain. She states some mild shortness of breath that she describes as feeling tired and having decreased energy. Notes her nebulizer helps her breathe better. She used a nebulizer treatment about 2 hours ago. Has a history of asthma. Last exacerbation was in October.  PMH: nonsmoker     ROS see history of present illness  Objective  Physical Exam Filed Vitals:   04/19/15 1302  BP: 132/88  Pulse: 88  Temp: 99.1 F (37.3 C)    Physical Exam  Constitutional: She is well-developed, well-nourished, and in no distress.  HENT:  Head: Normocephalic and atraumatic.  Right Ear: External ear normal.  Left Ear: External ear normal.  Mild posterior oropharyngeal erythema, no tonsillar exudate, normal TMs bilaterally  Eyes: Conjunctivae are normal. Pupils are equal, round, and reactive to light.  Neck: Neck supple.  Cardiovascular: Normal rate, regular rhythm and normal heart sounds.  Exam reveals no gallop and no friction rub.   No murmur heard. Pulmonary/Chest: Effort normal. No respiratory distress. She has wheezes (scattered expiratory wheezes). She has no rales.  Lymphadenopathy:    She has no cervical adenopathy.  Neurological: She is alert. Gait normal.  Skin: Skin is warm and dry. She is not diaphoretic.     Assessment/Plan: Please see individual problem list.  Asthma  with acute exacerbation Age and symptoms are most consistent with viral upper respiratory infection and reactive airway disease. Given duration of symptoms and lack of focality on exam suspect this is viral in nature. She does have wheezes. She has an albuterol nebulizer at home. She is given Depo-Medrol IM 80 mg today in the office as she states she cannot tolerate the constipation becomes with by mouth prednisone. Doubt pneumonia given lack of focal findings and stable vital signs.    Meds ordered this encounter  Medications  . methylPREDNISolone acetate (DEPO-MEDROL) injection 80 mg    Sig:     Conservation officer, historic buildingsDragon voice recognition software was used during the dictation process of this note. If any phrases or words seem inappropriate it is likely secondary to the translation process being inefficient.  Marikay AlarEric Sina Sumpter

## 2015-04-25 ENCOUNTER — Telehealth: Payer: Self-pay

## 2015-04-25 NOTE — Telephone Encounter (Signed)
Pt left v/m; pt got letter from [employer] ins co. As of 04/15/2015 Ins no longer covering namebrand lipitor, xopenex HFA inhaler and singulair generic which cost to pt is $257.99. in the past pt had issues with generic meds and that is why pt has been getting namebrand meds. Letter said if medically necessary to remain on non covered drug to request exception by provider calling [phone]. If safe for pt to take generic after Dr Dayton MartesAron reviewing pts file can call [phone] for prior authorization. Pt request cb ASAP.

## 2015-04-25 NOTE — Telephone Encounter (Signed)
Attempted to contact [phone]# provided. Letter cannot be submitted, requires peer to peer with physician

## 2015-04-25 NOTE — Telephone Encounter (Signed)
Ok to write letter stating that it is medically necessary for her to remain on trade name rx due to previous intolerances to generic rx.

## 2015-04-28 ENCOUNTER — Other Ambulatory Visit: Payer: Self-pay | Admitting: Family Medicine

## 2015-05-01 NOTE — Telephone Encounter (Signed)
I tried to call number and did not get a call back.  Will try again tomorrow.  In the meantime, please call in generic rx for pt.

## 2015-05-01 NOTE — Telephone Encounter (Signed)
Patient called and said she has one Lipitor left for tonight.  Please call patient and let her know what she should do about her medication. Please call patient at [phone].

## 2015-05-02 ENCOUNTER — Other Ambulatory Visit: Payer: Self-pay | Admitting: Family Medicine

## 2015-05-02 MED ORDER — ATORVASTATIN CALCIUM 20 MG PO TABS: 20.0000 mg | ORAL_TABLET | Freq: Every day | ORAL | Status: DC

## 2015-05-02 MED ORDER — MONTELUKAST SODIUM 10 MG PO TABS: 10.0000 mg | ORAL_TABLET | Freq: Every day | ORAL | Status: DC

## 2015-05-02 MED ORDER — LEVALBUTEROL TARTRATE 45 MCG/ACT IN AERO: INHALATION_SPRAY | RESPIRATORY_TRACT | Status: DC

## 2015-05-02 MED ORDER — LEVALBUTEROL HCL 0.63 MG/3ML IN NEBU: INHALATION_SOLUTION | RESPIRATORY_TRACT | Status: DC

## 2015-05-02 NOTE — Addendum Note (Signed)
Addended by: Desmond Dike on: 05/02/2015 02:07 PM   Modules accepted: Orders

## 2015-05-02 NOTE — Telephone Encounter (Signed)
Generic Rx sent to requested pharmacy

## 2015-05-04 ENCOUNTER — Telehealth: Payer: Self-pay | Admitting: *Deleted

## 2015-05-04 NOTE — Telephone Encounter (Signed)
Fax received indicating PA required for xopenex inhaler. Completed via [email or web address]; awaiting response

## 2015-05-08 NOTE — Telephone Encounter (Signed)
Spoke to pt and advised her to contact her insurance company and verify what is covered, so that a new Rx can be sent.

## 2015-05-08 NOTE — Telephone Encounter (Signed)
Response received and pts Rx for xopenex has been denied. Pt has not met requirements for this medication, and although she has failed on dulera, she has not failed on a medication on their formulary. New Rx has to be given, but no suggestions provided on what is covered.

## 2015-05-08 NOTE — Telephone Encounter (Signed)
Please inquire as to what is covered.

## 2015-05-09 ENCOUNTER — Other Ambulatory Visit: Payer: Self-pay | Admitting: *Deleted

## 2015-05-09 MED ORDER — DIAZEPAM 2 MG PO TABS
2.0000 mg | ORAL_TABLET | Freq: Three times a day (TID) | ORAL | Status: DC
Start: 2015-05-09 — End: 2015-05-22

## 2015-05-09 NOTE — Telephone Encounter (Signed)
Rx called in to requested pharmacy 

## 2015-05-09 NOTE — Telephone Encounter (Signed)
Spoke to pharmacy and advised of denial. Per pharmacy, they will remove PA in their system

## 2015-05-09 NOTE — Telephone Encounter (Signed)
Last f/u appt 10/2014-CPE 

## 2015-05-09 NOTE — Telephone Encounter (Signed)
[facility] left v/m requesting status of Xopenex inhaler PA.

## 2015-05-21 ENCOUNTER — Other Ambulatory Visit: Payer: Self-pay | Admitting: *Deleted

## 2015-05-21 ENCOUNTER — Other Ambulatory Visit: Payer: Self-pay | Admitting: Family Medicine

## 2015-05-21 MED ORDER — METFORMIN HCL 500 MG PO TABS: ORAL_TABLET | ORAL | Status: DC

## 2015-05-21 NOTE — Telephone Encounter (Signed)
Received faxed refill request from pharmacy. See drug warning with Tagamet Is it okay to refill?

## 2015-05-22 ENCOUNTER — Other Ambulatory Visit: Payer: Self-pay

## 2015-05-22 MED ORDER — DIAZEPAM 2 MG PO TABS: 2.0000 mg | ORAL_TABLET | Freq: Three times a day (TID) | ORAL | Status: DC

## 2015-05-22 NOTE — Telephone Encounter (Signed)
Pt left v/m requesting refill for valium; pt takes for tachycardia prn; pt request cb. Last refilled # 30 on 05/09/15(per instructions can take q8h). Last annual 11/01/14.

## 2015-05-23 NOTE — Telephone Encounter (Signed)
Spoke to pt and advised that Rx was filled 01/25. Pt states that she forgot Rx was at the bottom of her closet. She had not opened them as she still had some left. Pt will check closet upon arrival at home. Rx not called in

## 2015-05-26 ENCOUNTER — Other Ambulatory Visit: Payer: Self-pay | Admitting: Family Medicine

## 2015-06-23 ENCOUNTER — Encounter (HOSPITAL_COMMUNITY): Payer: Self-pay | Admitting: *Deleted

## 2015-06-23 ENCOUNTER — Other Ambulatory Visit (HOSPITAL_COMMUNITY)
Admission: RE | Admit: 2015-06-23 | Discharge: 2015-06-23 | Disposition: A | Discharge status: Home / Self Care | Payer: BC Managed Care – PPO | Source: Ambulatory Visit | Attending: Emergency Medicine | Admitting: Emergency Medicine

## 2015-06-23 ENCOUNTER — Emergency Department (HOSPITAL_COMMUNITY)
Admission: EM | Admit: 2015-06-23 | Discharge: 2015-06-23 | Disposition: A | Discharge status: Home / Self Care | Payer: BC Managed Care – PPO | Source: Home / Self Care

## 2015-06-23 DIAGNOSIS — J029 Acute pharyngitis, unspecified: Secondary | ICD-10-CM | POA: Diagnosis present

## 2015-06-23 LAB — POCT RAPID STREP A: Streptococcus, Group A Screen (Direct): NEGATIVE

## 2015-06-23 MED ORDER — LIDOCAINE VISCOUS 2 % MT SOLN: 20.0000 mL | OROMUCOSAL | Status: DC | PRN

## 2015-06-23 MED ORDER — AZITHROMYCIN 250 MG PO TABS: ORAL_TABLET | ORAL | Status: DC

## 2015-06-23 NOTE — ED Provider Notes (Signed)
CSN: [id number]     Arrival date & time 06/23/15  1731 History   None    No chief complaint on file.  (Consider location/radiation/quality/duration/timing/severity/associated sxs/prior Treatment) Patient is a 39 y.o. female presenting with URI. The history is provided by the patient.  URI Presenting symptoms: congestion, fever and sore throat   Severity:  Moderate Duration:  1 day Timing:  Constant Progression:  Worsening Chronicity:  New Relieved by:  Nothing Worsened by:  Nothing tried Ineffective treatments:  OTC medications Associated symptoms: neck pain and swollen glands     Past Medical History  Diagnosis Date  . Acne   . Allergic rhinitis   . Asthma   . HLD (hyperlipidemia)   . Chronic bronchitis   . Seasonal allergies   . GERD (gastroesophageal reflux disease)   . Blood in stool    Past Surgical History  Procedure Laterality Date  . Cholecystectomy    . Tonsillectomy    . Hemorroidectomy     Family History  Problem Relation Age of Onset  . Arthritis Other     Family hx  . Diabetes Other     1st degree relative  . Hypertension Other     Family hx  . Stroke Other     1st degree relative < 50  . Other      Cardiovascular disorder--Family hx  . Heart attack Father    Social History  Substance Use Topics  . Smoking status: Never Smoker   . Smokeless tobacco: Never Used  . Alcohol Use: 0.0 oz/week    0 Standard drinks or equivalent per week     Comment: Very rare; mixed drink   OB History    No data available     Review of Systems  Constitutional: Positive for fever.  HENT: Positive for congestion and sore throat.   Eyes: Negative.   Respiratory: Negative.   Cardiovascular: Negative.   Gastrointestinal: Negative.   Endocrine: Negative.   Genitourinary: Negative.   Musculoskeletal: Positive for neck pain.  Skin: Negative.   Allergic/Immunologic: Negative.   Neurological: Negative.   Hematological: Negative.   Psychiatric/Behavioral:  Negative.     Allergies  Levalbuterol; Buspar; Sulfamethoxazole; and Sulfonamide derivatives  Home Medications   Prior to Admission medications   Medication Sig Start Date End Date Taking? Authorizing Provider  albuterol (PROVENTIL) (2.5 MG/3ML) 0.083% nebulizer solution Take 3 mLs (2.5 mg total) by nebulization every 6 (six) hours as needed for wheezing. 01/22/15 01/22/16  Dianne Dun, MD  atorvastatin (LIPITOR) 20 MG tablet Take 1 tablet (20 mg total) by mouth daily. 05/02/15   Dianne Dun, MD  BYSTOLIC 10 MG tablet TAKE 1 TABLET BY MOUTH EVERY DAY 03/19/15   Dianne Dun, MD  cetirizine (ZYRTEC) 10 MG tablet Take 10 mg by mouth daily.      Historical Provider, MD  Cimetidine (TAGAMET PO) Take by mouth as needed.      Historical Provider, MD  diazepam (VALIUM) 2 MG tablet Take 1 tablet (2 mg total) by mouth every 8 (eight) hours. 05/22/15   Dianne Dun, MD  docusate sodium (COLACE) 100 MG capsule Take 200 mg by mouth at bedtime.      Historical Provider, MD  levalbuterol (XOPENEX HFA) 45 MCG/ACT inhaler INHALE 1 PUFF INTO THE LUNGS EVERY 4 (FOUR) HOURS AS NEEDED FOR WHEEZING. 05/02/15   Dianne Dun, MD  levalbuterol (XOPENEX) 0.63 MG/3ML nebulizer solution USE 1 VIAL EVERY 4 HRS AS NEEDED FOR WHEEZING  05/02/15   Dianne Dunalia M Aron, MD  metFORMIN (GLUCOPHAGE) 500 MG tablet TAKE 1 TABLET (500 MG TOTAL) BY MOUTH DAILY WITH BREAKFAST. 05/21/15   Dianne Dunalia M Aron, MD  mometasone-formoterol (DULERA) 100-5 MCG/ACT AERO INHALE 2 PUFFS INTO THE LUNGS 2 (TWO)  TIMES DAILY 05/30/14   Dianne Dunalia M Aron, MD  montelukast (SINGULAIR) 10 MG tablet Take 1 tablet (10 mg total) by mouth at bedtime. 05/02/15   Dianne Dunalia M Aron, MD  NITRO-BID 2 % ointment APPLY RECTALLY 3 TIMES DAILY. 04/30/15   Dianne Dunalia M Aron, MD  Nitroglycerin 0.4 % OINT Apply rectally three times daily as needed 11/01/14   Dianne Dunalia M Aron, MD  omeprazole (PRILOSEC OTC) 20 MG tablet Take 20 mg by mouth as needed.     Historical Provider, MD  Probiotic Product (ALIGN) 4 MG  CAPS Take 1 capsule by mouth daily.      Historical Provider, MD  YASMIN 28 3-0.03 MG tablet Take 1 tablet by mouth daily. 11/01/14   Dianne Dunalia M Aron, MD   Meds Ordered and Administered this Visit  Medications - No data to display  BP 133/77 mmHg  Pulse 83  Temp(Src) 99.3 F (37.4 C) (Oral)  SpO2 98% No data found.   Physical Exam  Constitutional: She is oriented to person, place, and time. She appears well-developed and well-nourished.  HENT:  Head: Normocephalic.  Right Ear: External ear normal.  Left Ear: External ear normal.  opx injected adn tonsils 2 plus no exudates  Cardiovascular: Normal rate, regular rhythm and normal heart sounds.   Pulmonary/Chest: Effort normal and breath sounds normal.  Lymphadenopathy:    She has cervical adenopathy.  Neurological: She is alert and oriented to person, place, and time.    ED Course  Procedures (including critical care time)  Labs Review Labs Reviewed - No data to display  Imaging Review No results found.   Visual Acuity Review  Right Eye Distance:   Left Eye Distance:   Bilateral Distance:    Right Eye Near:   Left Eye Near:    Bilateral Near:         MDM  URI Pharyngitis  Z- Pak as directed Lidocaine viscous 20 ml qid prn pain #100 ml Push po fluids, rest, tylenol and motrin otc prn as directed for fever, arthralgias, and myalgias.  Follow up prn if sx's continue or persist.    Deatra CanterWilliam J Oxford, FNP 06/23/15 1844  Deatra CanterWilliam J Oxford, FNP 06/27/15 1700  Deatra CanterWilliam J Oxford, FNP 06/27/15 1700

## 2015-06-23 NOTE — Discharge Instructions (Signed)

## 2015-06-23 NOTE — ED Notes (Signed)
Pt  Reports      sorethroat      /    Fever     With   Symptoms     X  1  Day         Had    Symptoms     Of cough   And  Congestion               As  Well   That  She  Reports  Just  Got  Over       She  Has  A  History  Of  Asthma

## 2015-06-26 LAB — CULTURE, GROUP A STREP (THRC)

## 2015-09-03 ENCOUNTER — Other Ambulatory Visit: Payer: Self-pay | Admitting: Family Medicine

## 2015-09-27 ENCOUNTER — Other Ambulatory Visit: Payer: Self-pay | Admitting: Family Medicine

## 2015-10-13 ENCOUNTER — Other Ambulatory Visit: Payer: Self-pay | Admitting: Family Medicine

## 2015-10-21 ENCOUNTER — Other Ambulatory Visit: Payer: Self-pay | Admitting: Family Medicine

## 2015-10-25 ENCOUNTER — Other Ambulatory Visit: Payer: Self-pay | Admitting: Family Medicine

## 2015-10-25 DIAGNOSIS — Z01419 Encounter for gynecological examination (general) (routine) without abnormal findings: Secondary | ICD-10-CM

## 2015-10-25 DIAGNOSIS — R7309 Other abnormal glucose: Secondary | ICD-10-CM

## 2015-10-30 ENCOUNTER — Other Ambulatory Visit (INDEPENDENT_AMBULATORY_CARE_PROVIDER_SITE_OTHER): Payer: BC Managed Care – PPO

## 2015-10-30 DIAGNOSIS — Z0000 Encounter for general adult medical examination without abnormal findings: Secondary | ICD-10-CM

## 2015-10-30 DIAGNOSIS — R7309 Other abnormal glucose: Secondary | ICD-10-CM

## 2015-10-30 DIAGNOSIS — Z01419 Encounter for gynecological examination (general) (routine) without abnormal findings: Secondary | ICD-10-CM

## 2015-10-30 LAB — COMPREHENSIVE METABOLIC PANEL
ALT: 21 U/L (ref 0–35)
AST: 42 U/L — ABNORMAL HIGH (ref 0–37)
Albumin: 3.7 g/dL (ref 3.5–5.2)
Alkaline Phosphatase: 84 U/L (ref 39–117)
BILIRUBIN TOTAL: 0.4 mg/dL (ref 0.2–1.2)
BUN: 8 mg/dL (ref 6–23)
CALCIUM: 9.1 mg/dL (ref 8.4–10.5)
CO2: 27 meq/L (ref 19–32)
Chloride: 104 mEq/L (ref 96–112)
Creatinine, Ser: 0.64 mg/dL (ref 0.40–1.20)
GFR: 109.01 mL/min (ref >60.00)
Glucose, Bld: 114 mg/dL — ABNORMAL HIGH (ref 70–99)
POTASSIUM: 3.9 meq/L (ref 3.5–5.1)
Sodium: 139 mEq/L (ref 135–145)
Total Protein: 7.4 g/dL (ref 6.0–8.3)

## 2015-10-30 LAB — LIPID PANEL
CHOL/HDL RATIO: 3
Cholesterol: 139 mg/dL (ref 0–200)
HDL: 50.2 mg/dL (ref >39.00)
LDL Cholesterol: 50 mg/dL (ref 0–99)
NonHDL: 88.62
TRIGLYCERIDES: 191 mg/dL — AB (ref 0.0–149.0)
VLDL: 38.2 mg/dL (ref 0.0–40.0)

## 2015-10-30 LAB — TSH: TSH: 4.7 u[IU]/mL — ABNORMAL HIGH (ref 0.35–4.50)

## 2015-10-30 LAB — HEMOGLOBIN A1C: Hgb A1c MFr Bld: 6 % (ref 4.6–6.5)

## 2015-10-31 LAB — CBC WITH DIFFERENTIAL/PLATELET
BASOS ABS: 0.1 10*3/uL (ref 0.0–0.1)
BASOS PCT: 1.3 % (ref 0.0–3.0)
EOS PCT: 3 % (ref 0.0–5.0)
Eosinophils Absolute: 0.3 10*3/uL (ref 0.0–0.7)
HEMATOCRIT: 39.2 % (ref 36.0–46.0)
Hemoglobin: 12.7 g/dL (ref 12.0–15.0)
LYMPHS ABS: 3.6 10*3/uL (ref 0.7–4.0)
LYMPHS PCT: 38.4 % (ref 12.0–46.0)
MCHC: 32.4 g/dL (ref 30.0–36.0)
MCV: 84.2 fl (ref 78.0–100.0)
Monocytes Absolute: 0.4 10*3/uL (ref 0.1–1.0)
Monocytes Relative: 4.6 % (ref 3.0–12.0)
NEUTROS ABS: 4.9 10*3/uL (ref 1.4–7.7)
NEUTROS PCT: 52.7 % (ref 43.0–77.0)
Platelets: 205 10*3/uL (ref 150.0–400.0)
RBC: 4.65 Mil/uL (ref 3.87–5.11)
RDW: 13.8 % (ref 11.5–15.5)
WBC: 9.4 10*3/uL (ref 4.0–10.5)

## 2015-11-06 ENCOUNTER — Ambulatory Visit (INDEPENDENT_AMBULATORY_CARE_PROVIDER_SITE_OTHER): Payer: BC Managed Care – PPO | Admitting: Family Medicine

## 2015-11-06 VITALS — BP 142/80 | HR 89 | Temp 98.9°F | Ht 62.25 in | Wt 278.2 lb

## 2015-11-06 DIAGNOSIS — Z01419 Encounter for gynecological examination (general) (routine) without abnormal findings: Secondary | ICD-10-CM

## 2015-11-06 DIAGNOSIS — B359 Dermatophytosis, unspecified: Secondary | ICD-10-CM | POA: Diagnosis not present

## 2015-11-06 DIAGNOSIS — E669 Obesity, unspecified: Secondary | ICD-10-CM

## 2015-11-06 DIAGNOSIS — R7309 Other abnormal glucose: Secondary | ICD-10-CM | POA: Diagnosis not present

## 2015-11-06 DIAGNOSIS — G4700 Insomnia, unspecified: Secondary | ICD-10-CM

## 2015-11-06 DIAGNOSIS — E785 Hyperlipidemia, unspecified: Secondary | ICD-10-CM | POA: Diagnosis not present

## 2015-11-06 DIAGNOSIS — Z0000 Encounter for general adult medical examination without abnormal findings: Secondary | ICD-10-CM

## 2015-11-06 MED ORDER — METFORMIN HCL 500 MG PO TABS: 500.0000 mg | ORAL_TABLET | Freq: Every day | ORAL | 3 refills | Status: DC

## 2015-11-06 MED ORDER — ATORVASTATIN CALCIUM 20 MG PO TABS: 20.0000 mg | ORAL_TABLET | Freq: Every day | ORAL | 5 refills | Status: DC

## 2015-11-06 MED ORDER — DIAZEPAM 2 MG PO TABS: 2.0000 mg | ORAL_TABLET | Freq: Three times a day (TID) | ORAL | 0 refills | Status: DC

## 2015-11-06 MED ORDER — NEBIVOLOL HCL 10 MG PO TABS: 10.0000 mg | ORAL_TABLET | Freq: Every day | ORAL | 3 refills | Status: DC

## 2015-11-06 MED ORDER — KETOCONAZOLE 2 % EX CREA: 1.0000 "application " | TOPICAL_CREAM | Freq: Every day | CUTANEOUS | 0 refills | Status: DC

## 2015-11-06 MED ORDER — YASMIN 28 3-0.03 MG PO TABS: ORAL_TABLET | ORAL | 11 refills | Status: DC

## 2015-11-06 NOTE — Progress Notes (Signed)
Subjective:    Patient ID: [name], female    DOB: [birth year], 40 y.o.   MRN: [id number]  HPI  40 yo female here for CPX and follow up of chronic medical conditions.  Last pap smear done by me 11/01/14- normal. On Yasmin for OCPs.  She is pleased with this.  ? Ring worm- on her neck.  Thinks her [place] pig gave it to her.   Has tried OTC lotrimin without improvement of symptoms.  Prediabetes- taking Metformin 500 mg daily. Denies GI side effects currently. Lab Results  Component Value Date   HGBA1C 6.0 10/30/2015   Palpitations have been controlled with Bystolic and uses diazepam as needed for refractory palpitations.  HLD-  On Lipitor 20 mg daily.  Very strong FH of HLD and hypertriglyceridemia. She also has a personal history of very high triglycerides in past (over 1000) Lab Results  Component Value Date   CHOL 139 10/30/2015   HDL 50.20 10/30/2015   LDLCALC 50 10/30/2015   LDLDIRECT 60.0 03/02/2014   TRIG 191.0 (H) 10/30/2015   CHOLHDL 3 10/30/2015   HTN- BP well controlled Bystolic.  If she skips a dose, feels her heart racing. Lab Results  Component Value Date   CREATININE 0.64 10/30/2015   Lab Results  Component Value Date   WBC 9.4 10/30/2015   HGB 12.7 10/30/2015   HCT 39.2 10/30/2015   MCV 84.2 10/30/2015   PLT 205.0 10/30/2015   Lab Results  Component Value Date   ALT 21 10/30/2015   AST 42 (H) 10/30/2015   ALKPHOS 84 10/30/2015   BILITOT 0.4 10/30/2015   Lab Results  Component Value Date   NA 139 10/30/2015   K 3.9 10/30/2015   CL 104 10/30/2015   CO2 27 10/30/2015      Anal fissures- sees Dr. Elnoria Howard.  Uses Nitroglycerin ointment which has been successful.  Asthma- symptoms are well controlled. Patient Active Problem List  Diagnosis  . HLD (hyperlipidemia)  . ALLERGIC RHINITIS  . Asthma  . GERD  . Obesity  . Insomnia  . Elevated hemoglobin A1c  . Well woman exam  . History of anal fissures  . Elevated TSH   Past Medical  History:  Diagnosis Date  . Acne   . Allergic rhinitis   . Asthma   . Blood in stool   . Chronic bronchitis   . GERD (gastroesophageal reflux disease)   . HLD (hyperlipidemia)   . Seasonal allergies    Past Surgical History:  Procedure Laterality Date  . CHOLECYSTECTOMY    . HEMORROIDECTOMY    . TONSILLECTOMY     Social History  Substance Use Topics  . Smoking status: Never Smoker  . Smokeless tobacco: Never Used  . Alcohol use 0.0 oz/week     Comment: Very rare; mixed drink   Family History  Problem Relation Age of Onset  . Arthritis Other     Family hx  . Diabetes Other     1st degree relative  . Hypertension Other     Family hx  . Stroke Other     1st degree relative < 50  . Other      Cardiovascular disorder--Family hx  . Heart attack Father    Allergies  Allergen Reactions  . Levalbuterol Shortness Of Breath  . Buspar [Buspirone Hcl]      Fainting feelings   . Sulfamethoxazole     REACTION: unspecified  . Sulfonamide Derivatives     REACTION: rash  Current Outpatient Prescriptions on File Prior to Visit  Medication Sig Dispense Refill  . albuterol (PROVENTIL) (2.5 MG/3ML) 0.083% nebulizer solution Take 3 mLs (2.5 mg total) by nebulization every 6 (six) hours as needed for wheezing. 150 mL 2  . cetirizine (ZYRTEC) 10 MG tablet Take 10 mg by mouth daily.      . Cimetidine (TAGAMET PO) Take by mouth as needed.      . diazepam (VALIUM) 2 MG tablet Take 1 tablet (2 mg total) by mouth every 8 (eight) hours. 30 tablet 0  . docusate sodium (COLACE) 100 MG capsule Take 200 mg by mouth at bedtime.      . DULERA 100-5 MCG/ACT AERO INHALE 2 PUFFS INTO THE LUNGS 2 (TWO) TIMES DAILY 13 Inhaler 3  . levalbuterol (XOPENEX) 0.63 MG/3ML nebulizer solution USE 1 VIAL EVERY 4 HRS AS NEEDED FOR WHEEZING 72 mL 0  . lidocaine (XYLOCAINE) 2 % solution Use as directed 20 mLs in the mouth or throat as needed for mouth pain. 100 mL 0  . montelukast (SINGULAIR) 10 MG tablet Take 1  tablet (10 mg total) by mouth at bedtime. 30 tablet 5  . Nitroglycerin 0.4 % OINT Apply rectally three times daily as needed 1 Tube 3  . omeprazole (PRILOSEC OTC) 20 MG tablet Take 20 mg by mouth as needed.     . Probiotic Product (ALIGN) 4 MG CAPS Take 1 capsule by mouth daily.       No current facility-administered medications on file prior to visit.    The PMH, PSH, Social History, Family History, Medications, and allergies have been reviewed in [facility], and have been updated if relevant.   Review of Systems   Review of Systems  Constitutional: Negative for unexpected weight change.  HENT: Negative.   Eyes: Negative.   Respiratory: Negative.   Cardiovascular: Negative.   Gastrointestinal: Negative for constipation.  Endocrine: Negative.   Genitourinary: Negative.   Musculoskeletal: Negative for myalgias.  Skin: Positive for rash.  Allergic/Immunologic: Negative.   Neurological: Negative.   Hematological: Negative.   Psychiatric/Behavioral: Negative.   All other systems reviewed and are negative.    Objective:   Physical Exam   BP (!) 142/80   Pulse 89   Temp 98.9 F (37.2 C) (Oral)   Ht 5' 2.25" (1.581 m)   Wt 278 lb 4 oz (126.2 kg)   LMP 10/30/2015   SpO2 97%   BMI 50.48 kg/m   Wt Readings from Last 3 Encounters:  11/06/15 278 lb 4 oz (126.2 kg)  04/19/15 278 lb (126.1 kg)  01/26/15 278 lb (126.1 kg)    Physical Exam   General:  Well-developed,well-nourished,in no acute distress; alert,appropriate and cooperative throughout examination Head:  normocephalic and atraumatic.   Eyes:  vision grossly intact, pupils equal, pupils round, and pupils reactive to light.   Ears:  R ear normal and L ear normal.   Nose:  no external deformity.   Mouth:  good dentition.   Neck:  No deformities, masses, or tenderness noted. Breasts:  No mass, nodules, thickening, tenderness, bulging, retraction, inflamation, nipple discharge or skin changes noted.  Lungs:  Normal  respiratory effort, chest expands symmetrically. Lungs are clear to auscultation, no crackles or wheezes. Heart:  Normal rate and regular rhythm. S1 and S2 normal without gallop, murmur, click, rub or other extra sounds. Abdomen:  Bowel sounds positive,abdomen soft and non-tender without masses, organomegaly or hernias noted. Msk:  No deformity or scoliosis noted of thoracic or  lumbar spine.   Extremities:  No clubbing, cyanosis, edema, or deformity noted with normal full range of motion of all joints.   Neurologic:  alert & oriented X3 and gait normal.   Skin:  Circular lesions with central clearing on her neck Cervical Nodes:  No lymphadenopathy noted Axillary Nodes:  No palpable lymphadenopathy Psych:  Cognition and judgment appear intact. Alert and cooperative with normal attention span and concentration. No apparent delusions, illusions, hallucinations

## 2015-11-06 NOTE — Assessment & Plan Note (Signed)
Relatively stable on current dose of Metformin. No changes made today.

## 2015-11-06 NOTE — Patient Instructions (Signed)
Great to see you. Please schedule your mammogram.  Body Ringworm Ringworm (tinea corporis) is a fungal infection of the skin on the body. This infection is not caused by worms, but is actually caused by a fungus. Fungus normally lives on the top of your skin and can be useful. However, in the case of ringworms, the fungus grows out of control and causes a skin infection. It can involve any area of skin on the body and can spread easily from one person to another (contagious). Ringworm is a common problem for children, but it can affect adults as well. Ringworm is also often found in athletes, especially wrestlers who share equipment and mats.  CAUSES  Ringworm of the body is caused by a fungus called dermatophyte. It can spread by:  Touchingother people who are infected.  Touchinginfected pets.  Touching or sharingobjects that have been in contact with the infected person or pet (hats, combs, towels, clothing, sports equipment). SYMPTOMS   Itchy, raised red spots and bumps on the skin.  Ring-shaped rash.  Redness near the border of the rash with a clear center.  Dry and scaly skin on or around the rash. Not every person develops a ring-shaped rash. Some develop only the red, scaly patches. DIAGNOSIS  Most often, ringworm can be diagnosed by performing a skin exam. Your caregiver may choose to take a skin scraping from the affected area. The sample will be examined under the microscope to see if the fungus is present.  TREATMENT  Body ringworm may be treated with a topical antifungal cream or ointment. Sometimes, an antifungal shampoo that can be used on your body is prescribed. You may be prescribed antifungal medicines to take by mouth if your ringworm is severe, keeps coming back, or lasts a long time.  HOME CARE INSTRUCTIONS   Only take over-the-counter or prescription medicines as directed by your caregiver.  Wash the infected area and dry it completely before applying  yourcream or ointment.  When using antifungal shampoo to treat the ringworm, leave the shampoo on the body for 3-5 minutes before rinsing.   Wear loose clothing to stop clothes from rubbing and irritating the rash.  Wash or change your bed sheets every night while you have the rash.  Have your pet treated by your veterinarian if it has the same infection. To prevent ringworm:   Practice good hygiene.  Wear sandals or shoes in public places and showers.  Do not share personal items with others.  Avoid touching red patches of skin on other people.  Avoid touching pets that have bald spots or wash your hands after doing so. SEEK MEDICAL CARE IF:   Your rash continues to spread after 7 days of treatment.  Your rash is not gone in 4 weeks.  The area around your rash becomes red, warm, tender, and swollen.   This information is not intended to replace advice given to you by your health care provider. Make sure you discuss any questions you have with your health care provider.   Document Released: 03/28/2000 Document Revised: 12/24/2011 Document Reviewed: 10/13/2011 Elsevier Interactive Patient Education [employer]

## 2015-11-06 NOTE — Assessment & Plan Note (Signed)
Failed OTC lotrimin. eRx sent for topical ketoconazole. Call or return to clinic prn if these symptoms worsen or fail to improve as anticipated. The patient indicates understanding of these issues and agrees with the plan.

## 2015-11-06 NOTE — Assessment & Plan Note (Signed)
Well controlled on current dose of lipitor.  No changes made to rx today.

## 2015-11-06 NOTE — Assessment & Plan Note (Signed)
Reviewed preventive care protocols, scheduled due services, and updated immunizations Discussed nutrition, exercise, diet, and healthy lifestyle.  Pt to call to schedule mammogram. 

## 2015-11-29 ENCOUNTER — Other Ambulatory Visit: Payer: Self-pay | Admitting: Family Medicine

## 2015-12-18 ENCOUNTER — Telehealth: Payer: Self-pay | Admitting: Family Medicine

## 2015-12-19 ENCOUNTER — Telehealth: Payer: Self-pay

## 2015-12-19 NOTE — Telephone Encounter (Signed)
Spoke to pharmacy who states they will fill what the pt has been on previously;0.4%

## 2015-12-19 NOTE — Telephone Encounter (Signed)
Thank you for noticing this.  Both are on her med list.  Please call pt to ask which one she is asking to be refilled.

## 2015-12-19 NOTE — Telephone Encounter (Signed)
Last Rx 10/2014 and pt last seen 10/2015. pls advise

## 2015-12-19 NOTE — Telephone Encounter (Signed)
Opened in error

## 2015-12-19 NOTE — Telephone Encounter (Signed)
[facility] get a clarification on the Nitrobid 2% ointment R/X that was sent in today.  They want to be sure it is the correct amount desired as patient has been on 0.4% compound of Nitrobid for an extended period of time.    Please advise.

## 2015-12-20 ENCOUNTER — Other Ambulatory Visit: Payer: Self-pay | Admitting: Family Medicine

## 2015-12-27 ENCOUNTER — Encounter: Payer: Self-pay | Admitting: Internal Medicine

## 2015-12-27 ENCOUNTER — Ambulatory Visit (INDEPENDENT_AMBULATORY_CARE_PROVIDER_SITE_OTHER): Payer: BC Managed Care – PPO | Admitting: Internal Medicine

## 2015-12-27 VITALS — BP 124/80 | HR 73 | Temp 98.3°F | Wt 282.5 lb

## 2015-12-27 DIAGNOSIS — J069 Acute upper respiratory infection, unspecified: Secondary | ICD-10-CM

## 2015-12-27 DIAGNOSIS — B9789 Other viral agents as the cause of diseases classified elsewhere: Principal | ICD-10-CM

## 2015-12-27 MED ORDER — ALBUTEROL SULFATE HFA 108 (90 BASE) MCG/ACT IN AERS: 2.0000 | INHALATION_SPRAY | Freq: Four times a day (QID) | RESPIRATORY_TRACT | 0 refills | Status: DC | PRN

## 2015-12-27 MED ORDER — KETOCONAZOLE 2 % EX CREA: 1.0000 "application " | TOPICAL_CREAM | Freq: Every day | CUTANEOUS | 0 refills | Status: DC

## 2015-12-27 NOTE — Patient Instructions (Signed)

## 2015-12-27 NOTE — Progress Notes (Signed)
HPI  Pt presents to the clinic today with c/o cough. She reports this started 3 days ago with runny nose, post nasal drip, cough and wheezing. She was blowing clear mucous out of her nose. The cough is unproductive. She has been wheezing but denies shortness of breath. She denies fever, chills or body aches. She has tried Dayquil, Nyquil, Tylenol cold and sinus with some relief. She has a history of allergies and takes Zyrtec and Singulair. She has a history of asthma and takes [facility] as prescribed. She did have a RX for Xopenex but reports her insurance will no longer cover it. She request a RX for Albuterol inhaler. She has had sick contacts.  Review of Systems      Past Medical History:  Diagnosis Date  . Acne   . Allergic rhinitis   . Asthma   . Blood in stool   . Chronic bronchitis   . GERD (gastroesophageal reflux disease)   . HLD (hyperlipidemia)   . Seasonal allergies     Family History  Problem Relation Age of Onset  . Arthritis Other     Family hx  . Diabetes Other     1st degree relative  . Hypertension Other     Family hx  . Stroke Other     1st degree relative < 50  . Other      Cardiovascular disorder--Family hx  . Heart attack Father     Social History   Social History  . Marital status: Married    Spouse name: N/A  . Number of children: N/A  . Years of education: N/A   Occupational History  . Not on file.   Social History Main Topics  . Smoking status: Never Smoker  . Smokeless tobacco: Never Used  . Alcohol use No  . Drug use: No  . Sexual activity: Yes    Birth control/ protection: Pill   Other Topics Concern  . Not on file   Social History Narrative   No regular exercise    Allergies  Allergen Reactions  . Levalbuterol Shortness Of Breath  . Buspar [Buspirone Hcl]      Fainting feelings   . Sulfamethoxazole     REACTION: unspecified  . Sulfonamide Derivatives     REACTION: rash     Constitutional:  Denies headache, fatigue,  fever or abrupt weight changes.  HEENT:  Denies eye redness, eye pain, pressure behind the eyes, facial pain, nasal congestion, ear pain, ringing in the ears, wax buildup, runny nose or bloody nose. Respiratory: Positive cough. Denies difficulty breathing or shortness of breath.  Cardiovascular: Denies chest pain, chest tightness, palpitations or swelling in the hands or feet.   No other specific complaints in a complete review of systems (except as listed in HPI above).  Objective:   BP 124/80 (BP Location: Left Arm, Patient Position: Sitting, Cuff Size: Large)   Pulse 73   Temp 98.3 F (36.8 C) (Oral)   Wt 282 lb 8 oz (128.1 kg)   LMP 12/26/2015   SpO2 97%   BMI 51.26 kg/m  Wt Readings from Last 3 Encounters:  12/27/15 282 lb 8 oz (128.1 kg)  11/06/15 278 lb 4 oz (126.2 kg)  04/19/15 278 lb (126.1 kg)     General: Appears her stated age, well developed, well nourished in NAD. HEENT: Head: normal shape and size, no sinus tenderness noted; Eyes: sclera white, no icterus, conjunctiva pink; Ears: Tm's gray and intact, normal light reflex; Nose: mucosa  pink and moist, septum midline; Throat/Mouth: + PND. Teeth present, mucosa erythematous and moist, no exudate noted, no lesions or ulcerations noted.  Neck: No cervical lymphadenopathy.  Cardiovascular: Normal rate and rhythm. S1,S2 noted.  No murmur, rubs or gallops noted.  Pulmonary/Chest: Normal effort and positive vesicular breath sounds. No respiratory distress. No wheezes, rales or ronchi noted.      Assessment & Plan:   Viral Upper Respiratory Infection with Cough:  Get some rest and drink plenty of water Continue Zyrtec and Singulair eRx for Albuterol inhaler Delsym as needed for cough  RTC as needed or if symptoms persist.   Nicki ReaperBAITY, Donato Studley, NP

## 2015-12-27 NOTE — Progress Notes (Signed)
Pre visit review using our clinic review tool, if applicable. No additional management support is needed unless otherwise documented below in the visit note. 

## 2015-12-28 ENCOUNTER — Encounter: Payer: Self-pay | Admitting: Internal Medicine

## 2016-01-15 ENCOUNTER — Other Ambulatory Visit: Payer: Self-pay | Admitting: Family Medicine

## 2016-01-17 ENCOUNTER — Telehealth: Payer: Self-pay | Admitting: Family Medicine

## 2016-01-17 NOTE — Telephone Encounter (Signed)
Pt left v/m requesting cb with reason why only got one refill of Ntg.

## 2016-01-18 NOTE — Telephone Encounter (Signed)
Yes ok to have two refills.

## 2016-01-18 NOTE — Telephone Encounter (Signed)
Is it ok if pt has multiple refills? Previous Rx did not have refills either. pls advise

## 2016-03-03 ENCOUNTER — Other Ambulatory Visit: Payer: Self-pay | Admitting: Family Medicine

## 2016-03-04 ENCOUNTER — Other Ambulatory Visit: Payer: Self-pay | Admitting: Family Medicine

## 2016-03-04 ENCOUNTER — Telehealth: Payer: Self-pay

## 2016-03-04 NOTE — Telephone Encounter (Signed)
[facility] wanting to verify strength of ntg. Previously pt had been getting ntg 0.4% ointment. Today received rx nitro bid 2 % electronically. Holly request cb. Both meds are on current med list.

## 2016-03-04 NOTE — Telephone Encounter (Signed)
Pt called- the cream is used for a fissure- long term issue and the strength is 0.4%. Pt is requesting a refill b/c this has happened before and fissure is long term. 1 refill lasts her about 1 mo on average.

## 2016-03-04 NOTE — Telephone Encounter (Signed)
Please call pt to verify how she is taking it and then update pharmacy and med list accordingly.  Thanks

## 2016-03-04 NOTE — Telephone Encounter (Signed)
We can't seem to get this refill right.  Please change med list as stated and refill as requested.

## 2016-03-05 NOTE — Telephone Encounter (Signed)
Spoke to pharmacy and verified that pt is taking 0.4%. I advised her that these Rx are being requested and filled electronically. Pharmacy states they will remove 2% from their database and pts chart has been updated

## 2016-03-24 ENCOUNTER — Other Ambulatory Visit: Payer: Self-pay | Admitting: Family Medicine

## 2016-03-24 NOTE — Telephone Encounter (Signed)
Pt request status of nitro bid ointment; advised pt already sent electronically to [facility] and pt voiced understanding.

## 2016-04-01 DIAGNOSIS — R194 Change in bowel habit: Secondary | ICD-10-CM | POA: Diagnosis not present

## 2016-04-01 DIAGNOSIS — K602 Anal fissure, unspecified: Secondary | ICD-10-CM | POA: Diagnosis not present

## 2016-04-17 DIAGNOSIS — K602 Anal fissure, unspecified: Secondary | ICD-10-CM | POA: Diagnosis not present

## 2016-04-17 DIAGNOSIS — K6289 Other specified diseases of anus and rectum: Secondary | ICD-10-CM | POA: Diagnosis not present

## 2016-04-17 DIAGNOSIS — K5904 Chronic idiopathic constipation: Secondary | ICD-10-CM | POA: Diagnosis not present

## 2016-04-28 ENCOUNTER — Telehealth: Payer: Self-pay | Admitting: *Deleted

## 2016-04-28 ENCOUNTER — Ambulatory Visit: Payer: Self-pay | Admitting: General Surgery

## 2016-04-28 DIAGNOSIS — K601 Chronic anal fissure: Secondary | ICD-10-CM | POA: Diagnosis not present

## 2016-04-28 MED ORDER — FLUTICASONE FUROATE-VILANTEROL 100-25 MCG/INH IN AEPB: 1.0000 | INHALATION_SPRAY | Freq: Every day | RESPIRATORY_TRACT | 3 refills | Status: DC

## 2016-04-28 NOTE — Telephone Encounter (Signed)
Does patient have a preference from these approved medications?

## 2016-04-28 NOTE — Telephone Encounter (Signed)
Fax received from pharmacy. Pt Dulera no longer covered. Pharmacy requesting Rx for advair, breo ellipta, or symbicort

## 2016-04-28 NOTE — H&P (Signed)
[name] 04/28/2016 10:41 AM Location: [facility] Patient #: [id number] DOB: 10-15-1975 Married / Language: English / Race: Refused to Report/Unreported Female  History of Present Illness Adolph Pollack(Dez Stauffer J. Harel Repetto MD; 04/28/2016 11:14 AM) The patient is a 41 year old female.   Note:She is referred by Dr. Loreta AveMann for consultation regarding a chronic anal fissure that is failing medical management. She reports having the fissure for approximately 8-9 years. She had been keeping the symptoms under control for the most part with topical nitroglycerin cream but then had a breakthrough symptoms. She saw Dr. Loreta AveMann who put her on diltiazem cream but she continues to have the symptoms which include severe pain as well as intermittent bleeding. The pain is so bad that she is unable to sit for long period of time and thus cannot work. She's been sent over here to discuss surgical options. She is keeping her bowels soft with a bowel regimen.  Past Surgical History Christianne Dolin(Christen Lambert, [place]; 04/28/2016 10:42 AM) Gallbladder Surgery - Laparoscopic Hemorrhoidectomy Tonsillectomy  Diagnostic Studies History Christianne Dolin(Christen Lambert, [place]; 04/28/2016 10:42 AM) Colonoscopy never Mammogram never Pap Smear 1-5 years ago  Allergies Christianne Dolin(Christen Lambert, RMA; 04/28/2016 10:46 AM) Levalbuterol *CHEMICALS* Shortness of breath. BuSpar *ANTIANXIETY AGENTS* Sulfamethoxazole *SULFONAMIDES* Sulfonamide Derivatives  Medication History Christianne Dolin(Christen Lambert, RMA; 04/28/2016 10:51 AM) Proventil HFA (108 (90 Base)MCG/ACT Aerosol Soln, Inhalation daily) Active. Bystolic (10MG  Tablet, Oral daily) Active. Albuterol Sulfate ((2.5 MG/3ML)0.083% Nebulized Soln, Inhalation as needed) Active. Lidocaine (5% Ointment, External daily) Active. Yasmin 28 (3-0.03MG  Tablet, Oral daily) Active. Montelukast Sodium (10MG  Tablet, Oral daily) Active. MetFORMIN HCl (500MG  Tablet, Oral daily) Active. Atorvastatin Calcium  (20MG  Tablet, Oral daily) Active. DiazePAM (2MG  Tablet, Oral daily) Active. ZyrTEC (10MG  Tablet, Oral daily) Active. Dulera (100-5MCG/ACT Aerosol, Inhalation daily) Active. Nizoral (2% Cream, External daily) Active. PriLOSEC (20MG  Capsule DR, Oral daily) Active. Align (4MG  Capsule, Oral daily) Active. Medications Reconciled  Social History Christianne Dolin(Christen Lambert, [place]; 04/28/2016 10:42 AM) Caffeine use Carbonated beverages. No alcohol use No drug use Tobacco use Never smoker.  Family History Christianne Dolin(Christen Lambert, [place]; 04/28/2016 10:42 AM) Cerebrovascular Accident Family Members In General. Depression Mother. Diabetes Mellitus Family Members In General, Mother. Heart Disease Family Members In General, Father. Heart disease in female family member before age 41  Pregnancy / Birth History Christianne Dolin(Christen Lambert, [place]; 04/28/2016 10:42 AM) Age at menarche 14 years. Contraceptive History Oral contraceptives. Gravida 0 Para 0 Regular periods  Other Problems Christianne Dolin(Christen Lambert, RMA; 04/28/2016 10:42 AM) Anxiety Disorder Asthma Cholelithiasis Gastroesophageal Reflux Disease Hemorrhoids Other disease, cancer, significant illness Pancreatitis     Review of Systems Christianne Dolin(Christen Lambert RMA; 04/28/2016 10:42 AM) General Present- Weight Loss. Not Present- Appetite Loss, Chills, Fatigue, Fever, Night Sweats and Weight Gain. Skin Not Present- Change in Wart/Mole, Dryness, Hives, Jaundice, New Lesions, Non-Healing Wounds, Rash and Ulcer. HEENT Present- Seasonal Allergies and Wears glasses/contact lenses. Not Present- Earache, Hearing Loss, Hoarseness, Nose Bleed, Oral Ulcers, Ringing in the Ears, Sinus Pain, Sore Throat, Visual Disturbances and Yellow Eyes. Respiratory Present- Difficulty Breathing and Wheezing. Not Present- Bloody sputum, Chronic Cough and Snoring. Gastrointestinal Present- Bloody Stool, Change in Bowel Habits, Constipation, Hemorrhoids and Rectal Pain. Not Present-  Abdominal Pain, Bloating, Chronic diarrhea, Difficulty Swallowing, Excessive gas, Gets full quickly at meals, Indigestion, Nausea and Vomiting. Female Genitourinary Not Present- Frequency, Nocturia, Painful Urination, Pelvic Pain and Urgency. Musculoskeletal Not Present- Back Pain, Joint Pain, Joint Stiffness, Muscle Pain, Muscle Weakness and Swelling of Extremities. Neurological Not Present- Decreased Memory, Fainting, Headaches, Numbness, Seizures, Tingling,  Tremor, Trouble walking and Weakness. Psychiatric Present- Anxiety. Not Present- Bipolar, Change in Sleep Pattern, Depression, Fearful and Frequent crying. Hematology Not Present- Blood Thinners, Easy Bruising, Excessive bleeding, Gland problems, HIV and Persistent Infections.  Vitals Christianne Dolin RMA; 04/28/2016 10:52 AM) 04/28/2016 10:51 AM Weight: 266.4 lb Height: 62in Body Surface Area: 2.16 m Body Mass Index: 48.72 kg/m  Temp.: 99.76F  Pulse: 82 (Regular)  BP: 122/80 (Sitting, Left Arm, Standard)      Physical Exam Adolph Pollack MD; 04/28/2016 11:16 AM)  The physical exam findings are as follows: Note:General appearance: Morbidly obese female in NAD. Pleasant and cooperative.  EARS, NOSE, MOUTH THROAT: Lamar/AT external ears: no lesions or deformities external nose: no lesions or deformities hearing: grossly normal lips: moist, no deformities EYES external: conjunctiva, lids, sclerae normal pupils: equal, round glasses: yes  CV ascultation: RRR, no murmur  RESP auscultation: breath sounds equal and clear respiratory effort: normal  GASTROINTESTINAL anorectal: external posterior skin tag with posterior anal fissure, full exam not performed due to significant discomfort  MUSCULOSKELETAL station and gait: normal muscle strength: grossly normal scar: gluteal cleft scar instability: none  NEUROPSYCHIATRIC alertness and orientation: normal speech: normal sensation: grossly  intact mood/affect/behavior: slightly anxious judgement and insight: normal  .    Assessment & Plan Adolph Pollack MD; 04/28/2016 11:19 AM)  CHRONIC POSTERIOR ANAL FISSURE (K60.1) Impression: She has failed medical management. She is interested in surgery.  Plan: Exam under anesthesia, lateral internal sphincterotomy. We discussed the procedure, success rate, risks, and aftercare. Risks include but are not limited to bleeding, infection, wound healing problems, anesthesia, incontinence, failure to solve the problem. She seems to understand all of these and would like to proceed. I encouraged her to continue her bowel regimen.  Avel Peace, M.D.

## 2016-04-28 NOTE — Telephone Encounter (Signed)
eRx for breo sent.

## 2016-04-28 NOTE — Telephone Encounter (Signed)
Attempted to contact pt; unable to leave message. Pharmacy states suggested Rx are of like result and are approved by her insurance

## 2016-05-05 ENCOUNTER — Other Ambulatory Visit: Payer: Self-pay | Admitting: Family Medicine

## 2016-05-09 NOTE — Telephone Encounter (Signed)
Pt called because she thinks she is having some side effects from the [facility].  She feels it aggravates her tachychardia.  She is also sensitive to the powder.  She said she was on dulera in the past and it worked great.  She is wondering if you could write a letter to her insurance to get the dulera approved.  Can you please call her to review.  She is lookging for help with this matter.

## 2016-05-12 NOTE — Telephone Encounter (Signed)
[facility].  Patient said she wants a medication that won't increase her tachycardia.  Patient, also, doesn't want a medication that sends powder to her lungs.  Please call patient back at [phone].

## 2016-05-12 NOTE — Telephone Encounter (Signed)
All of them have the potential of causing tachycardia. Has she tried symbicort in the past?

## 2016-05-12 NOTE — Telephone Encounter (Signed)
Tricia MatesWaynetta,  How do we start the process of getting prior authorization for dulera?

## 2016-05-12 NOTE — Telephone Encounter (Signed)
Lm on pts vm requesting a call back. Per pharmacy [name] is a medication not offered through her insurance and a PA is not available. She is able to try an alternate listed, that they suggested.

## 2016-05-12 NOTE — Telephone Encounter (Signed)
Spoke to pt who states she has never tried symbicort but is willing to do so. Can be sent to pharmacy on file

## 2016-05-13 ENCOUNTER — Ambulatory Visit: Payer: Self-pay | Admitting: General Surgery

## 2016-05-13 ENCOUNTER — Encounter (HOSPITAL_COMMUNITY): Payer: Self-pay | Admitting: *Deleted

## 2016-05-13 ENCOUNTER — Encounter (HOSPITAL_COMMUNITY): Payer: Self-pay

## 2016-05-13 MED ORDER — BUDESONIDE-FORMOTEROL FUMARATE 160-4.5 MCG/ACT IN AERO: 2.0000 | INHALATION_SPRAY | Freq: Two times a day (BID) | RESPIRATORY_TRACT | 3 refills | Status: DC

## 2016-05-13 NOTE — Progress Notes (Signed)
Spoke with Toniann FailWendy Triage nurse at [facility] regarding consent to be corrected in epic per MD. She stated she would  Make him aware

## 2016-05-13 NOTE — Telephone Encounter (Signed)
eRx sent for symbicort to pharmacy on file.

## 2016-05-13 NOTE — Patient Instructions (Addendum)
[name]  05/13/2016   Your procedure is scheduled on: 05/23/2016  [facility]  [facility]  elevators to 3rd floor to  Short Stay Center at     [phone].  Call this number if you have problems the morning of surgery [phone]   Remember: ONLY 1 PERSON MAY GO WITH YOU TO SHORT STAY TO GET  READY MORNING OF YOUR SURGERY.  Do not eat food or drink liquids :After Midnight. THE DAY OF SURGERY              Follow bowel prep instructions per surgeon office             Drink plenty of clear liquids on day of prep     Take these medicines the morning of surgery with A SIP OF WATER:  Albuterol (and bring), Symbicort (and bring), Bystolic, Prilosec and zyrtec if needed.               DO NOT TAKE ANY DIABETIC MEDICATIONS DAY OF YOUR SURGERY                               You may not have any metal on your body including hair pins and              piercings  Do not wear jewelry, make-up, lotions, powders or perfumes, deodorant             Do not wear nail polish.  Do not shave  48 hours prior to surgery.              Do not bring valuables to the hospital. [facility] IS NOT             RESPONSIBLE   FOR VALUABLES.  Contacts, dentures or bridgework may not be worn into surgery.   Patients discharged the day of surgery will not be allowed to drive home.  Name and phone number of your driver:  Special Instructions: N/A              Please read over the following fact sheets you were given: _____________________________________________________________________        CLEAR LIQUID DIET   Foods Allowed                                                                     Foods Excluded  Coffee and tea, regular and decaf                             liquids that you cannot  Plain Jell-O in any flavor                                             see through such as: Fruit ices (not with fruit pulp)  milk,  soups, orange juice  Iced Popsicles                                    All solid food Carbonated beverages, regular and diet                                    Cranberry, grape and apple juices Sports drinks like Gatorade Lightly seasoned clear broth or consume(fat free) Sugar, honey syrup  Sample Menu Breakfast                                Lunch                                     Supper Cranberry juice                    Beef broth                            Chicken broth Jell-O                                     Grape juice                           Apple juice Coffee or tea                        Jell-O                                      Popsicle                                                Coffee or tea                        Coffee or tea  _____________________________________________________________________  [facility] - Preparing for Surgery Before surgery, you can play an important role.  Because skin is not sterile, your skin needs to be as free of germs as possible.  You can reduce the number of germs on your skin by washing with CHG (chlorahexidine gluconate) soap before surgery.  CHG is an antiseptic cleaner which kills germs and bonds with the skin to continue killing germs even after washing. Please DO NOT use if you have an allergy to CHG or antibacterial soaps.  If your skin becomes reddened/irritated stop using the CHG and inform your nurse when you arrive at Short Stay. Do not shave (including legs and underarms) for at least 48 hours prior to the first CHG shower.  You may shave your face/neck. Please follow these instructions carefully:  1.  Shower with CHG Soap the night before surgery and the  morning of Surgery.  2.  If you choose to  wash your hair, wash your hair first as usual with your  normal  shampoo.  3.  After you shampoo, rinse your hair and body thoroughly to remove the  shampoo.                           4.  Use CHG as you would any other liquid soap.   You can apply chg directly  to the skin and wash                       Gently with a scrungie or clean washcloth.  5.  Apply the CHG Soap to your body ONLY FROM THE NECK DOWN.   Do not use on face/ open                           Wound or open sores. Avoid contact with eyes, ears mouth and genitals (private parts).                       Wash face,  Genitals (private parts) with your normal soap.             6.  Wash thoroughly, paying special attention to the area where your surgery  will be performed.  7.  Thoroughly rinse your body with warm water from the neck down.  8.  DO NOT shower/wash with your normal soap after using and rinsing off  the CHG Soap.                9.  Pat yourself dry with a clean towel.            10.  Wear clean pajamas.            11.  Place clean sheets on your bed the night of your first shower and do not  sleep with pets. Day of Surgery : Do not apply any lotions/deodorants the morning of surgery.  Please wear clean clothes to the hospital/surgery center.  FAILURE TO FOLLOW THESE INSTRUCTIONS MAY RESULT IN THE CANCELLATION OF YOUR SURGERY PATIENT SIGNATURE_________________________________  NURSE SIGNATURE__________________________________  ________________________________________________________________________

## 2016-05-13 NOTE — Addendum Note (Signed)
Addended by: Dianne DunARON, Esbeydi Manago M on: 05/13/2016 07:18 AM   Modules accepted: Orders

## 2016-05-14 ENCOUNTER — Encounter (HOSPITAL_COMMUNITY): Payer: Self-pay

## 2016-05-14 ENCOUNTER — Encounter (HOSPITAL_COMMUNITY)
Admission: RE | Admit: 2016-05-14 | Discharge: 2016-05-14 | Disposition: A | Discharge status: Home / Self Care | Payer: BC Managed Care – PPO | Source: Ambulatory Visit | Attending: General Surgery | Admitting: General Surgery

## 2016-05-14 DIAGNOSIS — Z01812 Encounter for preprocedural laboratory examination: Secondary | ICD-10-CM | POA: Insufficient documentation

## 2016-05-14 DIAGNOSIS — K601 Chronic anal fissure: Secondary | ICD-10-CM | POA: Insufficient documentation

## 2016-05-14 DIAGNOSIS — Z01810 Encounter for preprocedural cardiovascular examination: Secondary | ICD-10-CM | POA: Diagnosis not present

## 2016-05-14 HISTORY — DX: Cardiac arrhythmia, unspecified: I49.9

## 2016-05-14 HISTORY — DX: Dyspnea, unspecified: R06.00

## 2016-05-14 HISTORY — DX: Pneumonia, unspecified organism: J18.9

## 2016-05-14 HISTORY — DX: Prediabetes: R73.03

## 2016-05-14 HISTORY — DX: Anxiety disorder, unspecified: F41.9

## 2016-05-14 LAB — CBC
HCT: 42 % (ref 36.0–46.0)
Hemoglobin: 13.7 g/dL (ref 12.0–15.0)
MCH: 28.1 pg (ref 26.0–34.0)
MCHC: 32.6 g/dL (ref 30.0–36.0)
MCV: 86.1 fL (ref 78.0–100.0)
PLATELETS: 230 10*3/uL (ref 150–400)
RBC: 4.88 MIL/uL (ref 3.87–5.11)
RDW: 13.4 % (ref 11.5–15.5)
WBC: 8.7 10*3/uL (ref 4.0–10.5)

## 2016-05-14 LAB — BASIC METABOLIC PANEL
Anion gap: 13 (ref 5–15)
BUN: 8 mg/dL (ref 6–20)
CALCIUM: 10 mg/dL (ref 8.9–10.3)
CO2: 24 mmol/L (ref 22–32)
CREATININE: 0.66 mg/dL (ref 0.44–1.00)
Chloride: 102 mmol/L (ref 101–111)
Glucose, Bld: 123 mg/dL — ABNORMAL HIGH (ref 65–99)
Potassium: 4.1 mmol/L (ref 3.5–5.1)
SODIUM: 139 mmol/L (ref 135–145)

## 2016-05-14 LAB — GLUCOSE, CAPILLARY: GLUCOSE-CAPILLARY: 114 mg/dL — AB (ref 65–99)

## 2016-05-14 LAB — HCG, SERUM, QUALITATIVE: Preg, Serum: NEGATIVE

## 2016-05-15 LAB — HEMOGLOBIN A1C
HEMOGLOBIN A1C: 5.8 % — AB (ref 4.8–5.6)
Mean Plasma Glucose: 120 mg/dL

## 2016-05-22 ENCOUNTER — Other Ambulatory Visit: Payer: Self-pay | Admitting: Family Medicine

## 2016-05-23 ENCOUNTER — Ambulatory Visit (HOSPITAL_COMMUNITY): Payer: BC Managed Care – PPO | Admitting: Registered Nurse

## 2016-05-23 ENCOUNTER — Encounter (HOSPITAL_COMMUNITY): Payer: Self-pay | Admitting: *Deleted

## 2016-05-23 ENCOUNTER — Encounter (HOSPITAL_COMMUNITY)
Admission: RE | Disposition: A | Discharge status: Home / Self Care | Payer: Self-pay | Source: Ambulatory Visit | Attending: General Surgery

## 2016-05-23 ENCOUNTER — Ambulatory Visit (HOSPITAL_COMMUNITY)
Admission: RE | Admit: 2016-05-23 | Discharge: 2016-05-23 | Disposition: A | Discharge status: Home / Self Care | Payer: BC Managed Care – PPO | Source: Ambulatory Visit | Attending: General Surgery | Admitting: General Surgery

## 2016-05-23 DIAGNOSIS — J45909 Unspecified asthma, uncomplicated: Secondary | ICD-10-CM | POA: Diagnosis not present

## 2016-05-23 DIAGNOSIS — Z8249 Family history of ischemic heart disease and other diseases of the circulatory system: Secondary | ICD-10-CM | POA: Insufficient documentation

## 2016-05-23 DIAGNOSIS — Z6841 Body Mass Index (BMI) 40.0 and over, adult: Secondary | ICD-10-CM | POA: Insufficient documentation

## 2016-05-23 DIAGNOSIS — E785 Hyperlipidemia, unspecified: Secondary | ICD-10-CM | POA: Diagnosis not present

## 2016-05-23 DIAGNOSIS — Z818 Family history of other mental and behavioral disorders: Secondary | ICD-10-CM | POA: Diagnosis not present

## 2016-05-23 DIAGNOSIS — Z9049 Acquired absence of other specified parts of digestive tract: Secondary | ICD-10-CM | POA: Diagnosis not present

## 2016-05-23 DIAGNOSIS — Z882 Allergy status to sulfonamides status: Secondary | ICD-10-CM | POA: Insufficient documentation

## 2016-05-23 DIAGNOSIS — Z888 Allergy status to other drugs, medicaments and biological substances status: Secondary | ICD-10-CM | POA: Insufficient documentation

## 2016-05-23 DIAGNOSIS — K601 Chronic anal fissure: Secondary | ICD-10-CM | POA: Diagnosis not present

## 2016-05-23 DIAGNOSIS — Z823 Family history of stroke: Secondary | ICD-10-CM | POA: Diagnosis not present

## 2016-05-23 DIAGNOSIS — F419 Anxiety disorder, unspecified: Secondary | ICD-10-CM | POA: Diagnosis not present

## 2016-05-23 DIAGNOSIS — Z79899 Other long term (current) drug therapy: Secondary | ICD-10-CM | POA: Insufficient documentation

## 2016-05-23 DIAGNOSIS — L918 Other hypertrophic disorders of the skin: Secondary | ICD-10-CM | POA: Insufficient documentation

## 2016-05-23 DIAGNOSIS — K219 Gastro-esophageal reflux disease without esophagitis: Secondary | ICD-10-CM | POA: Insufficient documentation

## 2016-05-23 DIAGNOSIS — K644 Residual hemorrhoidal skin tags: Secondary | ICD-10-CM | POA: Insufficient documentation

## 2016-05-23 DIAGNOSIS — Z833 Family history of diabetes mellitus: Secondary | ICD-10-CM | POA: Diagnosis not present

## 2016-05-23 DIAGNOSIS — J309 Allergic rhinitis, unspecified: Secondary | ICD-10-CM | POA: Diagnosis not present

## 2016-05-23 HISTORY — PX: ANAL FISSURE REPAIR: SHX2312

## 2016-05-23 LAB — GLUCOSE, CAPILLARY: Glucose-Capillary: 123 mg/dL — ABNORMAL HIGH (ref 65–99)

## 2016-05-23 SURGERY — EXAM UNDER ANESTHESIA
Anesthesia: General

## 2016-05-23 MED ORDER — ACETAMINOPHEN 325 MG PO TABS: 650.0000 mg | ORAL_TABLET | ORAL | Status: DC | PRN

## 2016-05-23 MED ORDER — PROPOFOL 10 MG/ML IV BOLUS
INTRAVENOUS | Status: DC | PRN
  Administered 2016-05-23: 200 mg via INTRAVENOUS

## 2016-05-23 MED ORDER — DEXAMETHASONE SODIUM PHOSPHATE 10 MG/ML IJ SOLN
INTRAMUSCULAR | Status: DC | PRN
  Administered 2016-05-23: 10 mg via INTRAVENOUS

## 2016-05-23 MED ORDER — LIDOCAINE HCL (CARDIAC) 20 MG/ML IV SOLN
INTRAVENOUS | Status: DC | PRN
  Administered 2016-05-23: 100 mg via INTRAVENOUS

## 2016-05-23 MED ORDER — LACTATED RINGERS IV SOLN
INTRAVENOUS | Status: DC
  Administered 2016-05-23: 10:00:00 via INTRAVENOUS

## 2016-05-23 MED ORDER — ROCURONIUM BROMIDE 10 MG/ML (PF) SYRINGE
PREFILLED_SYRINGE | INTRAVENOUS | Status: DC | PRN
  Administered 2016-05-23: 40 mg via INTRAVENOUS

## 2016-05-23 MED ORDER — ONDANSETRON HCL 4 MG/2ML IJ SOLN
INTRAMUSCULAR | Status: AC
  Filled 2016-05-23: qty 2

## 2016-05-23 MED ORDER — CEFOTETAN DISODIUM-DEXTROSE 2-2.08 GM-% IV SOLR
INTRAVENOUS | Status: AC
  Filled 2016-05-23: qty 50

## 2016-05-23 MED ORDER — BUPIVACAINE-EPINEPHRINE (PF) 0.5% -1:200000 IJ SOLN
INTRAMUSCULAR | Status: AC
  Filled 2016-05-23: qty 30

## 2016-05-23 MED ORDER — MIDAZOLAM HCL 5 MG/5ML IJ SOLN
INTRAMUSCULAR | Status: DC | PRN
  Administered 2016-05-23: 2 mg via INTRAVENOUS

## 2016-05-23 MED ORDER — GLYCOPYRROLATE 0.2 MG/ML IV SOSY
PREFILLED_SYRINGE | INTRAVENOUS | Status: AC
  Filled 2016-05-23: qty 3

## 2016-05-23 MED ORDER — ROCURONIUM BROMIDE 50 MG/5ML IV SOSY
PREFILLED_SYRINGE | INTRAVENOUS | Status: AC
  Filled 2016-05-23: qty 5

## 2016-05-23 MED ORDER — FENTANYL CITRATE (PF) 100 MCG/2ML IJ SOLN
INTRAMUSCULAR | Status: DC | PRN
  Administered 2016-05-23: 100 ug via INTRAVENOUS

## 2016-05-23 MED ORDER — OXYCODONE HCL 5 MG PO TABS: 5.0000 mg | ORAL_TABLET | ORAL | 0 refills | Status: DC | PRN

## 2016-05-23 MED ORDER — OXYCODONE HCL 5 MG PO TABS: 5.0000 mg | ORAL_TABLET | ORAL | Status: DC | PRN

## 2016-05-23 MED ORDER — HYALURONIDASE HUMAN 150 UNIT/ML IJ SOLN
INTRAMUSCULAR | Status: AC
  Filled 2016-05-23: qty 1

## 2016-05-23 MED ORDER — MIDAZOLAM HCL 2 MG/2ML IJ SOLN
INTRAMUSCULAR | Status: AC
  Filled 2016-05-23: qty 2

## 2016-05-23 MED ORDER — SUCCINYLCHOLINE CHLORIDE 200 MG/10ML IV SOSY
PREFILLED_SYRINGE | INTRAVENOUS | Status: DC | PRN
  Administered 2016-05-23: 120 mg via INTRAVENOUS

## 2016-05-23 MED ORDER — BUPIVACAINE LIPOSOME 1.3 % IJ SUSP
20.0000 mL | Freq: Once | INTRAMUSCULAR | Status: AC
  Administered 2016-05-23: 20 mL
  Filled 2016-05-23: qty 20

## 2016-05-23 MED ORDER — PROPOFOL 10 MG/ML IV BOLUS
INTRAVENOUS | Status: AC
  Filled 2016-05-23: qty 20

## 2016-05-23 MED ORDER — DEXAMETHASONE SODIUM PHOSPHATE 10 MG/ML IJ SOLN
INTRAMUSCULAR | Status: AC
  Filled 2016-05-23: qty 1

## 2016-05-23 MED ORDER — SUCCINYLCHOLINE CHLORIDE 200 MG/10ML IV SOSY
PREFILLED_SYRINGE | INTRAVENOUS | Status: AC
  Filled 2016-05-23: qty 10

## 2016-05-23 MED ORDER — 0.9 % SODIUM CHLORIDE (POUR BTL) OPTIME
TOPICAL | Status: DC | PRN
  Administered 2016-05-23: 1000 mL

## 2016-05-23 MED ORDER — SODIUM CHLORIDE 0.9% FLUSH: 3.0000 mL | INTRAVENOUS | Status: DC | PRN

## 2016-05-23 MED ORDER — DEXTROSE 5 % IV SOLN
2.0000 g | INTRAVENOUS | Status: AC
  Administered 2016-05-23: 2 g via INTRAVENOUS

## 2016-05-23 MED ORDER — METOCLOPRAMIDE HCL 5 MG/ML IJ SOLN: 10.0000 mg | Freq: Once | INTRAMUSCULAR | Status: DC | PRN

## 2016-05-23 MED ORDER — MEPERIDINE HCL 50 MG/ML IJ SOLN: 6.2500 mg | INTRAMUSCULAR | Status: DC | PRN

## 2016-05-23 MED ORDER — LACTATED RINGERS IV SOLN: INTRAVENOUS | Status: DC

## 2016-05-23 MED ORDER — GLYCOPYRROLATE 0.2 MG/ML IV SOSY
PREFILLED_SYRINGE | INTRAVENOUS | Status: DC | PRN
  Administered 2016-05-23: .2 mg via INTRAVENOUS

## 2016-05-23 MED ORDER — LIDOCAINE 2% (20 MG/ML) 5 ML SYRINGE
INTRAMUSCULAR | Status: AC
  Filled 2016-05-23: qty 5

## 2016-05-23 MED ORDER — SUGAMMADEX SODIUM 200 MG/2ML IV SOLN
INTRAVENOUS | Status: DC | PRN
Start: 2016-05-23 — End: 2016-05-23
  Administered 2016-05-23: 250 mg via INTRAVENOUS

## 2016-05-23 MED ORDER — LIP MEDEX EX OINT
TOPICAL_OINTMENT | CUTANEOUS | Status: AC
  Filled 2016-05-23: qty 7

## 2016-05-23 MED ORDER — ONDANSETRON HCL 4 MG/2ML IJ SOLN
INTRAMUSCULAR | Status: DC | PRN
Start: 2016-05-23 — End: 2016-05-23
  Administered 2016-05-23: 4 mg via INTRAVENOUS

## 2016-05-23 MED ORDER — FENTANYL CITRATE (PF) 100 MCG/2ML IJ SOLN
25.0000 ug | INTRAMUSCULAR | Status: DC | PRN
  Administered 2016-05-23 (×2): 50 ug via INTRAVENOUS

## 2016-05-23 MED ORDER — FENTANYL CITRATE (PF) 100 MCG/2ML IJ SOLN
INTRAMUSCULAR | Status: AC
  Administered 2016-05-23: 50 ug via INTRAVENOUS
  Filled 2016-05-23: qty 2

## 2016-05-23 MED ORDER — ACETAMINOPHEN 650 MG RE SUPP
650.0000 mg | RECTAL | Status: DC | PRN
  Filled 2016-05-23: qty 1

## 2016-05-23 MED ORDER — FENTANYL CITRATE (PF) 100 MCG/2ML IJ SOLN
INTRAMUSCULAR | Status: AC
  Filled 2016-05-23: qty 2

## 2016-05-23 SURGICAL SUPPLY — 36 items
BLADE HEX COATED 2.75 (ELECTRODE) ×3 IMPLANT
BLADE SURG 15 STRL LF DISP TIS (BLADE) ×1 IMPLANT
BLADE SURG 15 STRL SS (BLADE) ×3
COVER BACK TABLE 60X90IN (DRAPES) ×3 IMPLANT
COVER SURGICAL LIGHT HANDLE (MISCELLANEOUS) ×3 IMPLANT
DRAIN PENROSE 18X1/2 LTX STRL (DRAIN) ×3 IMPLANT
DRAIN PENROSE 18X1/4 LTX STRL (WOUND CARE) IMPLANT
DRAPE LAPAROTOMY T 102X78X121 (DRAPES) ×3 IMPLANT
DRSG PAD ABDOMINAL 8X10 ST (GAUZE/BANDAGES/DRESSINGS) ×3 IMPLANT
ELECT PENCIL ROCKER SW 15FT (MISCELLANEOUS) ×3 IMPLANT
ELECT REM PT RETURN 9FT ADLT (ELECTROSURGICAL) ×3
ELECTRODE REM PT RTRN 9FT ADLT (ELECTROSURGICAL) ×1 IMPLANT
GAUZE SPONGE 4X4 12PLY STRL (GAUZE/BANDAGES/DRESSINGS) ×3 IMPLANT
GAUZE SPONGE 4X4 16PLY XRAY LF (GAUZE/BANDAGES/DRESSINGS) ×3 IMPLANT
GLOVE BIOGEL PI IND STRL 7.0 (GLOVE) ×1 IMPLANT
GLOVE BIOGEL PI INDICATOR 7.0 (GLOVE) ×2
GLOVE ECLIPSE 8.0 STRL XLNG CF (GLOVE) ×3 IMPLANT
GLOVE INDICATOR 8.0 STRL GRN (GLOVE) ×6 IMPLANT
GOWN STRL REUS W/TWL LRG LVL3 (GOWN DISPOSABLE) ×3 IMPLANT
GOWN STRL REUS W/TWL XL LVL3 (GOWN DISPOSABLE) ×6 IMPLANT
KIT BASIN OR (CUSTOM PROCEDURE TRAY) ×3 IMPLANT
LUBRICANT JELLY K Y 4OZ (MISCELLANEOUS) ×3 IMPLANT
MARKER SKIN DUAL TIP RULER LAB (MISCELLANEOUS) ×3 IMPLANT
NDL HYPO 25X1 1.5 SAFETY (NEEDLE) ×1 IMPLANT
NDL SAFETY ECLIPSE 18X1.5 (NEEDLE) ×1 IMPLANT
NEEDLE HYPO 18GX1.5 SHARP (NEEDLE) ×3
NEEDLE HYPO 25X1 1.5 SAFETY (NEEDLE) ×3 IMPLANT
NS IRRIG 1000ML POUR BTL (IV SOLUTION) ×3 IMPLANT
PACK LITHOTOMY IV (CUSTOM PROCEDURE TRAY) ×3 IMPLANT
SPONGE HEMORRHOID 8X3CM (HEMOSTASIS) ×2 IMPLANT
SUT CHROMIC 2 0 SH (SUTURE) ×2 IMPLANT
SUT CHROMIC 3 0 SH 27 (SUTURE) ×2 IMPLANT
SYR CONTROL 10ML LL (SYRINGE) ×3 IMPLANT
TOWEL OR 17X26 10 PK STRL BLUE (TOWEL DISPOSABLE) ×3 IMPLANT
TOWEL OR NON WOVEN STRL DISP B (DISPOSABLE) ×3 IMPLANT
YANKAUER SUCT BULB TIP 10FT TU (MISCELLANEOUS) ×3 IMPLANT

## 2016-05-23 NOTE — Discharge Instructions (Addendum)
General Anesthesia, Adult, Care After These instructions provide you with information about caring for yourself after your procedure. Your health care provider may also give you more specific instructions. Your treatment has been planned according to current medical practices, but problems sometimes occur. Call your health care provider if you have any problems or questions after your procedure. What can I expect after the procedure? After the procedure, it is common to have:  Vomiting.  A sore throat.  Mental slowness. It is common to feel:  Nauseous.  Cold or shivery.  Sleepy.  Tired.  Sore or achy, even in parts of your body where you did not have surgery. Follow these instructions at home: For at least 24 hours after the procedure:  Do not:  Participate in activities where you could fall or become injured.  Drive.  Use heavy machinery.  Drink alcohol.  Take sleeping pills or medicines that cause drowsiness.  Make important decisions or sign legal documents.  Take care of children on your own.  Rest. Eating and drinking  If you vomit, drink water, juice, or soup when you can drink without vomiting.  Drink enough fluid to keep your urine clear or pale yellow.  Make sure you have little or no nausea before eating solid foods.  Follow the diet recommended by your health care provider. General instructions  Have a responsible adult stay with you until you are awake and alert.  Return to your normal activities as told by your health care provider. Ask your health care provider what activities are safe for you.  Take over-the-counter and prescription medicines only as told by your health care provider.  If you smoke, do not smoke without supervision.  Keep all follow-up visits as told by your health care provider. This is important. Contact a health care provider if:  You continue to have nausea or vomiting at home, and medicines are not helpful.  You  cannot drink fluids or start eating again.  You cannot urinate after 8-12 hours.  You develop a skin rash.  You have fever.  You have increasing redness at the site of your procedure. Get help right away if:  You have difficulty breathing.  You have chest pain.  You have unexpected bleeding.  You feel that you are having a life-threatening or urgent problem. This information is not intended to replace advice given to you by your health care provider. Make sure you discuss any questions you have with your health care provider. Document Released: 07/07/2000 Document Revised: 09/03/2015 Document Reviewed: 03/15/2015 Elsevier Interactive Patient Education  2017 [employer] CCS _______[facility]  RECTAL SURGERY POST OP INSTRUCTIONS: POST OP INSTRUCTIONS  Always review your discharge instruction sheet given to you by the facility where your surgery was performed. IF YOU HAVE DISABILITY OR FAMILY LEAVE FORMS, YOU MUST BRING THEM TO THE OFFICE FOR PROCESSING.   DO NOT GIVE THEM TO YOUR DOCTOR.  1. A  prescription for pain medication may be given to you upon discharge.  Take your pain medication as prescribed, if needed.  If narcotic pain medicine is not needed, then you may take acetaminophen (Tylenol) or ibuprofen (Advil) as needed. 2. Take your usually prescribed medications unless otherwise directed. 3. If you need a refill on your pain medication, please contact your pharmacy.  They will contact our office to request authorization. Prescriptions will not be filled after 5 pm or on week-ends. 4. You should follow a liquid diet the first 48 hours after  arrival home, such as soup and crackers, etc.  Be sure to include lots of fluids daily.  Resume your normal diet 2-3 days after surgery.. 5. Most patients will experience some swelling and discomfort in the rectal area. Ice packs, reclining and warm tub soaks will help.  Swelling and discomfort can take several days to  resolve.  6. It is common to experience some constipation if taking pain medication after surgery.  Increasing fluid intake and taking a stool softener (such as Colace) will usually help or prevent this problem from occurring.  A mild laxative (Milk of Magnesia or Miralax) should be taken according to package directions if there are no bowel movements after 48 hours. 7. Unless discharge instructions indicate otherwise, leave your bandage dry and in place for 24 hours, or remove the bandage if you have a bowel movement. You may notice a small amount of bleeding with bowel movements for the first few days. You may have some packing in the rectum which will come out over the first day or two. You will need to wear an absorbent pad or soft cotton gauze in your underwear until the drainage stops.it. 8. ACTIVITIES:  You may resume regular (light) daily activities beginning the next day--such as daily self-care, walking, climbing stairs--gradually increasing activities as tolerated.  You may have sexual intercourse when it is comfortable.  Refrain from any heavy lifting or straining for one week until approved by your doctor. a. You may drive when you are no longer taking prescription pain medication, you can comfortably wear a seatbelt, and you can safely maneuver your car and apply brakes. b. RETURN TO WORK: : ____________________ c.  9. You should see your doctor in the office for a follow-up appointment approximately 2-3 weeks after your surgery.  Make sure that you call for this appointment within a day or two after you arrive home to insure a convenient appointment time. 10. OTHER INSTRUCTIONS:  _You have a a pack in your rectum that may pass or dissolve._________________________________________________________________________________________________________________________________________________________________________________________  WHEN TO CALL YOUR DOCTOR: 1. Fever over 101.0 2. Inability to  urinate 3. Nausea and/or vomiting 4. Extreme swelling or bruising 5. Continued bleeding from rectum. 6. Increased pain, redness, or drainage from the incision 7. Constipation  The clinic staff is available to answer your questions during regular business hours.  Please dont hesitate to call and ask to speak to one of the nurses for clinical concerns.  If you have a medical emergency, go to the nearest emergency room or call 911.  A surgeon from [facility] is always on call at the hospital   [address], [place], [place]  [address] ?  [address] [address], [place], [place]   [address] [phone] ? [phone] ? FAX [phone] Web site: [email or web address]

## 2016-05-23 NOTE — Anesthesia Procedure Notes (Signed)
Procedure Name: Intubation Date/Time: 05/23/2016 11:10 AM Performed by: Jarvis NewcomerARMISTEAD, Gethsemane Fischler A Pre-anesthesia Checklist: Patient identified, Suction available, Emergency Drugs available, Patient being monitored and Timeout performed Patient Re-evaluated:Patient Re-evaluated prior to inductionOxygen Delivery Method: Circle system utilized Preoxygenation: Pre-oxygenation with 100% oxygen Intubation Type: IV induction Ventilation: Mask ventilation without difficulty Laryngoscope Size: Miller and 2 Grade View: Grade I Tube type: Oral Number of attempts: 1 Airway Equipment and Method: Stylet Secured at: 20 cm Dental Injury: Teeth and Oropharynx as per pre-operative assessment  Comments: Intubated by Larey SeatJ Slabach SRNA

## 2016-05-23 NOTE — Transfer of Care (Signed)
Immediate Anesthesia Transfer of Care Note  Patient: [name]  Procedure(s) Performed: Procedure(s): EXAM UNDER ANESTHESIA (N/A) Lateral Internal Sphincterotomy and Excision of Internal Tag (N/A)  Patient Location: PACU  Anesthesia Type:General  Level of Consciousness: awake, alert , oriented and patient cooperative  Airway & Oxygen Therapy: Patient Spontanous Breathing and Patient connected to face mask oxygen  Post-op Assessment: Report given to RN, Post -op Vital signs reviewed and stable and Patient moving all extremities  Post vital signs: Reviewed and stable  Last Vitals:  Vitals:   05/23/16 0855  BP: (!) 146/84  Pulse: 89  Resp: 16  Temp: 37.2 C    Last Pain:  Vitals:   05/23/16 0914  TempSrc:   PainSc: 1       Patients Stated Pain Goal: 3 (05/23/16 0914)  Complications: No apparent anesthesia complications

## 2016-05-23 NOTE — Anesthesia Preprocedure Evaluation (Signed)
Anesthesia Evaluation  Patient identified by MRN, date of birth, ID band Patient awake    Reviewed: Allergy & Precautions, NPO status , Patient's Chart, lab work & pertinent test results  Airway Mallampati: II  TM Distance: >3 FB Neck ROM: Full    Dental no notable dental hx.    Pulmonary asthma ,    Pulmonary exam normal breath sounds clear to auscultation       Cardiovascular negative cardio ROS Normal cardiovascular exam Rhythm:Regular Rate:Normal     Neuro/Psych negative neurological ROS  negative psych ROS   GI/Hepatic Neg liver ROS, GERD  Medicated and Controlled,  Endo/Other  Morbid obesity  Renal/GU negative Renal ROS  negative genitourinary   Musculoskeletal negative musculoskeletal ROS (+)   Abdominal   Peds negative pediatric ROS (+)  Hematology negative hematology ROS (+)   Anesthesia Other Findings   Reproductive/Obstetrics negative OB ROS                             Anesthesia Physical Anesthesia Plan  ASA: III  Anesthesia Plan: General   Post-op Pain Management:    Induction: Intravenous  Airway Management Planned: Oral ETT  Additional Equipment:   Intra-op Plan:   Post-operative Plan: Extubation in OR  Informed Consent: I have reviewed the patients History and Physical, chart, labs and discussed the procedure including the risks, benefits and alternatives for the proposed anesthesia with the patient or authorized representative who has indicated his/her understanding and acceptance.   Dental advisory given  Plan Discussed with: CRNA  Anesthesia Plan Comments:         Anesthesia Quick Evaluation

## 2016-05-23 NOTE — Interval H&P Note (Signed)
History and Physical Interval Note:  05/23/2016 10:32 AM  [name]  has presented today for surgery, with the diagnosis of CHRONIC ANAL FISSURE  The various methods of treatment have been discussed with the patient and family. After consideration of risks, benefits and other options for treatment, the patient has consented to  Procedure(s): EXAM UNDER ANESTHESIA (N/A) ANAL FISSURE REPAIR (N/A) as a surgical intervention .  The patient's history has been reviewed, patient examined, no change in status, stable for surgery.  I have reviewed the patient's chart and labs.  Questions were answered to the patient's satisfaction.     Haziel Molner JShela Commons

## 2016-05-23 NOTE — Progress Notes (Signed)
Report to [place],

## 2016-05-23 NOTE — Op Note (Signed)
Operative Note  [name] female 41 y.o. 05/23/2016  PREOPERATIVE DX:  Chronic posterior anal fissure  POSTOPERATIVE DX:  Same  PROCEDURE:   Examination under anesthesia. Lateral internal sphincterotomy. Excision of sentinel anal tag.         Surgeon: Adolph PollackOSENBOWER,Zafir Schauer J   Assistants: Romie LeveeAlicia Thomas M.D.  Indication for Asst.:  This was a complex long-term issue which I felt would be best served having Dr. Maisie Fushomas, colorectal specialist, present to assist.  Anesthesia: General endotracheal anesthesia  Indications:   This is a 41 year old female whose had a chronic anal fissure for approximately 8 years. She's tried injections as well as topical therapies. Despite that, she continues to be significantly symptomatic especially recently. No incontinence. She now presents for the above procedures.    Procedure Detail:  She was brought to the operating room supine on the stretcher in the general anesthetic was administered. She was then turned to the prone position and padded appropriately. The buttocks were retracted exposing the anus. The perianal area was sterilely prepped and draped. A timeout was performed.  The sentinel tag posteriorly was evident as was the chronic fissure. Digital rectal exam was performed with one and then 2 fingers. Anal retractor was placed on the right side and then the thickened internal sphincter muscle could be palpated. A small incision was made in the anoderm in the intersphincteric groove. The external sphincter was swept laterally. The internal sphincter was identified and isolated. Using electrocautery, internal sphincterotomy was performed. It was obvious that there was less tension after the sphincterotomy.  The sphincterotomy wound was irrigated and gauze was placed. Using electrocautery, the firm posterior sentinel anal tag was removed and sent to pathology. Following this, a perianal block was performed with Exparel.  The wounds were than inspected  and bleeding was controlled with electrocautery. Once hemostasis was adequate, the anal mucosa at the incision site was closed with interrupted 3-0 chromic sutures. Gelfoam pad was placed in the anus. A bulky dressing was then placed.  She was then placed supine on the stretcher, extubated, and brought to the recovery room in satisfactory condition. There were no apparent complications.  Findings:  The internal sphincter muscle was significantly hypertrophied.   Estimated Blood Loss:  100 mL         Specimens: Anal skin tag        Complications:  * No complications entered in OR log *         Disposition: PACU - hemodynamically stable.         Condition: stable

## 2016-05-23 NOTE — H&P (View-Only) (Signed)
[name] 04/28/2016 10:41 AM Location: [facility] Patient #: [id number] DOB: 10-15-1975 Married / Language: English / Race: Refused to Report/Unreported Female  History of Present Illness Adolph Pollack(Nadea Kirkland J. Mekhi Lascola MD; 04/28/2016 11:14 AM) The patient is a 41 year old female.   Note:She is referred by Dr. Loreta AveMann for consultation regarding a chronic anal fissure that is failing medical management. She reports having the fissure for approximately 8-9 years. She had been keeping the symptoms under control for the most part with topical nitroglycerin cream but then had a breakthrough symptoms. She saw Dr. Loreta AveMann who put her on diltiazem cream but she continues to have the symptoms which include severe pain as well as intermittent bleeding. The pain is so bad that she is unable to sit for long period of time and thus cannot work. She's been sent over here to discuss surgical options. She is keeping her bowels soft with a bowel regimen.  Past Surgical History Christianne Dolin(Christen Lambert, [place]; 04/28/2016 10:42 AM) Gallbladder Surgery - Laparoscopic Hemorrhoidectomy Tonsillectomy  Diagnostic Studies History Christianne Dolin(Christen Lambert, [place]; 04/28/2016 10:42 AM) Colonoscopy never Mammogram never Pap Smear 1-5 years ago  Allergies Christianne Dolin(Christen Lambert, RMA; 04/28/2016 10:46 AM) Levalbuterol *CHEMICALS* Shortness of breath. BuSpar *ANTIANXIETY AGENTS* Sulfamethoxazole *SULFONAMIDES* Sulfonamide Derivatives  Medication History Christianne Dolin(Christen Lambert, RMA; 04/28/2016 10:51 AM) Proventil HFA (108 (90 Base)MCG/ACT Aerosol Soln, Inhalation daily) Active. Bystolic (10MG  Tablet, Oral daily) Active. Albuterol Sulfate ((2.5 MG/3ML)0.083% Nebulized Soln, Inhalation as needed) Active. Lidocaine (5% Ointment, External daily) Active. Yasmin 28 (3-0.03MG  Tablet, Oral daily) Active. Montelukast Sodium (10MG  Tablet, Oral daily) Active. MetFORMIN HCl (500MG  Tablet, Oral daily) Active. Atorvastatin Calcium  (20MG  Tablet, Oral daily) Active. DiazePAM (2MG  Tablet, Oral daily) Active. ZyrTEC (10MG  Tablet, Oral daily) Active. Dulera (100-5MCG/ACT Aerosol, Inhalation daily) Active. Nizoral (2% Cream, External daily) Active. PriLOSEC (20MG  Capsule DR, Oral daily) Active. Align (4MG  Capsule, Oral daily) Active. Medications Reconciled  Social History Christianne Dolin(Christen Lambert, [place]; 04/28/2016 10:42 AM) Caffeine use Carbonated beverages. No alcohol use No drug use Tobacco use Never smoker.  Family History Christianne Dolin(Christen Lambert, [place]; 04/28/2016 10:42 AM) Cerebrovascular Accident Family Members In General. Depression Mother. Diabetes Mellitus Family Members In General, Mother. Heart Disease Family Members In General, Father. Heart disease in female family member before age 41  Pregnancy / Birth History Christianne Dolin(Christen Lambert, [place]; 04/28/2016 10:42 AM) Age at menarche 14 years. Contraceptive History Oral contraceptives. Gravida 0 Para 0 Regular periods  Other Problems Christianne Dolin(Christen Lambert, RMA; 04/28/2016 10:42 AM) Anxiety Disorder Asthma Cholelithiasis Gastroesophageal Reflux Disease Hemorrhoids Other disease, cancer, significant illness Pancreatitis     Review of Systems Christianne Dolin(Christen Lambert RMA; 04/28/2016 10:42 AM) General Present- Weight Loss. Not Present- Appetite Loss, Chills, Fatigue, Fever, Night Sweats and Weight Gain. Skin Not Present- Change in Wart/Mole, Dryness, Hives, Jaundice, New Lesions, Non-Healing Wounds, Rash and Ulcer. HEENT Present- Seasonal Allergies and Wears glasses/contact lenses. Not Present- Earache, Hearing Loss, Hoarseness, Nose Bleed, Oral Ulcers, Ringing in the Ears, Sinus Pain, Sore Throat, Visual Disturbances and Yellow Eyes. Respiratory Present- Difficulty Breathing and Wheezing. Not Present- Bloody sputum, Chronic Cough and Snoring. Gastrointestinal Present- Bloody Stool, Change in Bowel Habits, Constipation, Hemorrhoids and Rectal Pain. Not Present-  Abdominal Pain, Bloating, Chronic diarrhea, Difficulty Swallowing, Excessive gas, Gets full quickly at meals, Indigestion, Nausea and Vomiting. Female Genitourinary Not Present- Frequency, Nocturia, Painful Urination, Pelvic Pain and Urgency. Musculoskeletal Not Present- Back Pain, Joint Pain, Joint Stiffness, Muscle Pain, Muscle Weakness and Swelling of Extremities. Neurological Not Present- Decreased Memory, Fainting, Headaches, Numbness, Seizures, Tingling,  Tremor, Trouble walking and Weakness. Psychiatric Present- Anxiety. Not Present- Bipolar, Change in Sleep Pattern, Depression, Fearful and Frequent crying. Hematology Not Present- Blood Thinners, Easy Bruising, Excessive bleeding, Gland problems, HIV and Persistent Infections.  Vitals Christianne Dolin RMA; 04/28/2016 10:52 AM) 04/28/2016 10:51 AM Weight: 266.4 lb Height: 62in Body Surface Area: 2.16 m Body Mass Index: 48.72 kg/m  Temp.: 99.76F  Pulse: 82 (Regular)  BP: 122/80 (Sitting, Left Arm, Standard)      Physical Exam Adolph Pollack MD; 04/28/2016 11:16 AM)  The physical exam findings are as follows: Note:General appearance: Morbidly obese female in NAD. Pleasant and cooperative.  EARS, NOSE, MOUTH THROAT: [place]/AT external ears: no lesions or deformities external nose: no lesions or deformities hearing: grossly normal lips: moist, no deformities EYES external: conjunctiva, lids, sclerae normal pupils: equal, round glasses: yes  CV ascultation: RRR, no murmur  RESP auscultation: breath sounds equal and clear respiratory effort: normal  GASTROINTESTINAL anorectal: external posterior skin tag with posterior anal fissure, full exam not performed due to significant discomfort  MUSCULOSKELETAL station and gait: normal muscle strength: grossly normal scar: gluteal cleft scar instability: none  NEUROPSYCHIATRIC alertness and orientation: normal speech: normal sensation: grossly  intact mood/affect/behavior: slightly anxious judgement and insight: normal  .    Assessment & Plan Adolph Pollack MD; 04/28/2016 11:19 AM)  CHRONIC POSTERIOR ANAL FISSURE (K60.1) Impression: She has failed medical management. She is interested in surgery.  Plan: Exam under anesthesia, lateral internal sphincterotomy. We discussed the procedure, success rate, risks, and aftercare. Risks include but are not limited to bleeding, infection, wound healing problems, anesthesia, incontinence, failure to solve the problem. She seems to understand all of these and would like to proceed. I encouraged her to continue her bowel regimen.  Avel Peace, M.D.

## 2016-05-23 NOTE — Anesthesia Postprocedure Evaluation (Signed)
Anesthesia Post Note  Patient: Pharmacist, communityMcKenzie [name]  Procedure(s) Performed: Procedure(s) (LRB): EXAM UNDER ANESTHESIA (N/A) Lateral Internal Sphincterotomy and Excision of Internal Tag (N/A)  Patient location during evaluation: PACU Anesthesia Type: General Level of consciousness: awake and alert Pain management: pain level controlled Vital Signs Assessment: post-procedure vital signs reviewed and stable Respiratory status: spontaneous breathing, nonlabored ventilation, respiratory function stable and patient connected to nasal cannula oxygen Cardiovascular status: blood pressure returned to baseline and stable Postop Assessment: no signs of nausea or vomiting Anesthetic complications: no       Last Vitals:  Vitals:   05/23/16 1315 05/23/16 1346  BP: 128/78 125/75  Pulse: 75 71  Resp: 16 16  Temp: 36.9 C     Last Pain:  Vitals:   05/23/16 1346  TempSrc:   PainSc: 2                  Phillips Groutarignan, Lyndsie Wallman

## 2016-06-18 ENCOUNTER — Other Ambulatory Visit: Payer: Self-pay | Admitting: Family Medicine

## 2016-07-20 ENCOUNTER — Encounter (HOSPITAL_COMMUNITY): Payer: Self-pay | Admitting: Emergency Medicine

## 2016-07-20 ENCOUNTER — Ambulatory Visit (HOSPITAL_COMMUNITY)
Admission: EM | Admit: 2016-07-20 | Discharge: 2016-07-20 | Disposition: A | Discharge status: Home / Self Care | Payer: BC Managed Care – PPO | Attending: Family Medicine | Admitting: Family Medicine

## 2016-07-20 ENCOUNTER — Ambulatory Visit (INDEPENDENT_AMBULATORY_CARE_PROVIDER_SITE_OTHER): Payer: BC Managed Care – PPO

## 2016-07-20 DIAGNOSIS — B9789 Other viral agents as the cause of diseases classified elsewhere: Secondary | ICD-10-CM

## 2016-07-20 DIAGNOSIS — R062 Wheezing: Secondary | ICD-10-CM

## 2016-07-20 DIAGNOSIS — R6889 Other general symptoms and signs: Secondary | ICD-10-CM | POA: Diagnosis not present

## 2016-07-20 DIAGNOSIS — R05 Cough: Secondary | ICD-10-CM | POA: Diagnosis not present

## 2016-07-20 DIAGNOSIS — J988 Other specified respiratory disorders: Principal | ICD-10-CM

## 2016-07-20 LAB — POCT I-STAT, CHEM 8
BUN: 4 mg/dL — ABNORMAL LOW (ref 6–20)
Calcium, Ion: 1.1 mmol/L — ABNORMAL LOW (ref 1.15–1.40)
Chloride: 101 mmol/L (ref 101–111)
Creatinine, Ser: 0.5 mg/dL (ref 0.44–1.00)
Glucose, Bld: 131 mg/dL — ABNORMAL HIGH (ref 65–99)
HEMATOCRIT: 38 % (ref 36.0–46.0)
HEMOGLOBIN: 12.9 g/dL (ref 12.0–15.0)
POTASSIUM: 3.9 mmol/L (ref 3.5–5.1)
Sodium: 136 mmol/L (ref 135–145)
TCO2: 23 mmol/L (ref 0–100)

## 2016-07-20 MED ORDER — ALBUTEROL SULFATE (2.5 MG/3ML) 0.083% IN NEBU: INHALATION_SOLUTION | RESPIRATORY_TRACT | 2 refills | Status: DC

## 2016-07-20 MED ORDER — OSELTAMIVIR PHOSPHATE 75 MG PO CAPS: 75.0000 mg | ORAL_CAPSULE | Freq: Two times a day (BID) | ORAL | 0 refills | Status: DC

## 2016-07-20 MED ORDER — IPRATROPIUM-ALBUTEROL 0.5-2.5 (3) MG/3ML IN SOLN
RESPIRATORY_TRACT | Status: AC
  Filled 2016-07-20: qty 3

## 2016-07-20 MED ORDER — IPRATROPIUM-ALBUTEROL 0.5-2.5 (3) MG/3ML IN SOLN
3.0000 mL | Freq: Once | RESPIRATORY_TRACT | Status: AC
  Administered 2016-07-20: 3 mL via RESPIRATORY_TRACT

## 2016-07-20 NOTE — ED Triage Notes (Signed)
Patient presents to [facility] with complaint of fever and cough. Patient states that she has used her nebulizer at home and has not gotten any relief. Fever here is 100.9

## 2016-07-20 NOTE — ED Provider Notes (Signed)
CSN: [id number]     Arrival date & time 07/20/16  1701 History   First MD Initiated Contact with Patient 07/20/16 1844     Chief Complaint  Patient presents with  . Fever   (Consider location/radiation/quality/duration/timing/severity/associated sxs/prior Treatment) HPI Patient comes in today with complaints of fever and cough 1 day. Patient states that she just returned from a cruise in [place] with her husband this past Friday. States that she was doing fine up until yesterday when she had fairly sudden onset of fever and cough. Known history of asthma and has been using her Symbicort, albuterol inhaler as well as her nebulizer without any improvement. States that her husband was just seen yesterday in our clinic for cough and congestion and prescribed an antibiotic.  Visit productive cough with chest congestion. Some wheezing. Denies chest pain sore throat, nausea vomiting diarrhea.  Feels lethargic. Past Medical History:  Diagnosis Date  . Acne   . Allergic rhinitis   . Anxiety   . Asthma   . Blood in stool   . Chronic bronchitis   . Dyspnea    At times  . Dysrhythmia    Tachycardia on bystolic for  . GERD (gastroesophageal reflux disease)   . HLD (hyperlipidemia)   . Pneumonia    hx of  . Pre-diabetes    On metformin preventively  . Seasonal allergies    Past Surgical History:  Procedure Laterality Date  . ANAL FISSURE REPAIR N/A 05/23/2016   Procedure: Lateral Internal Sphincterotomy and Excision of Internal Tag;  Surgeon: Avel Peace, MD;  Location: [facility];  Service: General;  Laterality: N/A;  . CHOLECYSTECTOMY    . CYST REMOVAL TRUNK     Bottom of spine  . HEMORROIDECTOMY    . TONSILLECTOMY     Family History  Problem Relation Age of Onset  . Heart attack Father   . Arthritis Other     Family hx  . Diabetes Other     1st degree relative  . Hypertension Other     Family hx  . Stroke Other     1st degree relative < 50  . Other      Cardiovascular  disorder--Family hx   Social History  Substance Use Topics  . Smoking status: Never Smoker  . Smokeless tobacco: Never Used  . Alcohol use No   OB History    No data available     Review of Systems  Constitutional: Positive for activity change.  HENT: Positive for congestion. Negative for ear pain, hearing loss, sinus pain, sinus pressure, sore throat and trouble swallowing.   Eyes: Negative.   Respiratory: Positive for cough, chest tightness, shortness of breath and wheezing.   Cardiovascular: Negative.   Gastrointestinal: Negative.   Genitourinary: Negative.   Musculoskeletal: Negative.   Neurological: Negative.   Psychiatric/Behavioral: Negative.     Allergies  Levalbuterol; Buspar [buspirone hcl]; Sulfamethoxazole; and Sulfonamide derivatives  Home Medications   Prior to Admission medications   Medication Sig Start Date End Date Taking? Authorizing Provider  albuterol (PROVENTIL HFA;VENTOLIN HFA) 108 (90 Base) MCG/ACT inhaler Inhale 2 puffs into the lungs every 6 (six) hours as needed for wheezing or shortness of breath. 12/27/15  Yes Lorre Munroe, NP  atorvastatin (LIPITOR) 20 MG tablet TAKE 1 TABLET (20 MG TOTAL) BY MOUTH DAILY. 05/22/16  Yes Dianne Dun, MD  cetirizine (ZYRTEC) 10 MG tablet Take 10 mg by mouth daily.     Yes Historical Provider, MD  docusate  sodium (COLACE) 100 MG capsule Take 200 mg by mouth 2 (two) times daily.    Yes Historical Provider, MD  metFORMIN (GLUCOPHAGE) 500 MG tablet Take 1 tablet (500 mg total) by mouth daily with breakfast. 11/06/15  Yes Dianne Dun, MD  montelukast (SINGULAIR) 10 MG tablet TAKE 1 TABLET (10 MG TOTAL) BY MOUTH AT BEDTIME. 06/18/16  Yes Dianne Dun, MD  nebivolol (BYSTOLIC) 10 MG tablet Take 1 tablet (10 mg total) by mouth daily. 11/06/15  Yes Dianne Dun, MD  OVER THE COUNTER MEDICATION Take 1 capsule by mouth at bedtime. Ultra Flora Probiotic   Yes Historical Provider, MD  YASMIN 28 3-0.03 MG tablet TAKE 1 TABLET BY  MOUTH DAILY. 11/06/15  Yes Dianne Dun, MD  albuterol (PROVENTIL) (2.5 MG/3ML) 0.083% nebulizer solution INHALE CONTENTS OF 1 VIAL VIA NEBULIZER EVERY 6 HOURS AS NEEDED FOR WHEEZING 07/20/16   Naida Sleight, PA-C  budesonide-formoterol [facility]) 160-4.5 MCG/ACT inhaler Inhale 2 puffs into the lungs 2 (two) times daily. 05/13/16   Dianne Dun, MD  diphenhydrAMINE (BENADRYL) 25 mg capsule Take 25 mg by mouth daily as needed for allergies.    Historical Provider, MD  naproxen sodium (ANAPROX) 220 MG tablet Take 440 mg by mouth daily as needed (pain).    Historical Provider, MD  omeprazole (PRILOSEC OTC) 20 MG tablet Take 20 mg by mouth daily as needed (indigestion).     Historical Provider, MD  oseltamivir (TAMIFLU) 75 MG capsule Take 1 capsule (75 mg total) by mouth every 12 (twelve) hours. 07/20/16   Naida Sleight, PA-C   Meds Ordered and Administered this Visit   Medications  ipratropium-albuterol (DUONEB) 0.5-2.5 (3) MG/3ML nebulizer solution 3 mL (3 mLs Nebulization Given 07/20/16 1922)    BP 128/66 (BP Location: Left Arm)   Pulse (!) 104   Temp (!) 100.9 F (38.3 C) (Oral)   SpO2 98%  No data found.   Physical Exam  Constitutional: She is oriented to person, place, and time. No distress.  HENT:  Head: Normocephalic and atraumatic.  Eyes: EOM are normal.  Neck: Normal range of motion.  Cardiovascular: Regular rhythm.   Tachycardic  Pulmonary/Chest: No respiratory distress. She has wheezes (Few scattered).  Abdominal: She exhibits no distension.  Neurological: She is alert and oriented to person, place, and time.  Skin: Skin is warm and dry.  Psychiatric: She has a normal mood and affect.    Urgent Care Course     Procedures (including critical care time)  Labs Review Labs Reviewed  POCT I-STAT, CHEM 8 - Abnormal; Notable for the following:       Result Value   BUN 4 (*)    Glucose, Bld 131 (*)    Calcium, Ion 1.10 (*)    All other components within normal limits     Imaging Review Dg Chest 2 View  Result Date: 07/20/2016 CLINICAL DATA:  Cough, fever and congestion since yesterday. EXAM: CHEST  2 VIEW COMPARISON:  None. FINDINGS: Lungs are adequately inflated without focal consolidation or effusion. Mild prominence of the bronchovascular markings which may be due to a viral bronchitic process. Cardiomediastinal silhouette is within normal. Minimal degenerative change of the spine. IMPRESSION: Minimal prominence of the central bronchovascular markings which may be due to a viral bronchitic process. Electronically Signed   By: Elberta Fortis M.D.   On: 07/20/2016 19:16     Visual Acuity Review  Right Eye Distance:   Left Eye Distance:  Bilateral Distance:    Right Eye Near:   Left Eye Near:    Bilateral Near:         MDM   1. Viral respiratory illness   2. Flu-like symptoms    Patient was given a nebulizer treatment here in the clinic. Prescription for tamiflu. Advised patient that this may be influenza.  Refilled the prescription for her home nebulizer solution. Recommend she follow up with her primary care physician the next couple of days for recheck. If symptoms progress she should go immediately to the emergency room. Note for out of work tomorrow. Can use over-the-counter cough medicine.  xrays, patient history and treatment plan reviewed with my attending Dr. Milus Glazier.     Naida Sleight, PA-C 07/20/16 1939

## 2016-07-20 NOTE — Discharge Instructions (Signed)
If your symptoms worsen he should go immediately to the emergency room for further evaluation and treatment.  Can use over-the-counter Tylenol and/or ibuprofen as needed and directed for fever.

## 2016-07-24 ENCOUNTER — Encounter: Payer: Self-pay | Admitting: Primary Care

## 2016-07-24 ENCOUNTER — Ambulatory Visit (INDEPENDENT_AMBULATORY_CARE_PROVIDER_SITE_OTHER): Payer: BC Managed Care – PPO | Admitting: Primary Care

## 2016-07-24 VITALS — BP 122/76 | HR 74 | Temp 98.4°F | Ht 62.25 in | Wt 266.4 lb

## 2016-07-24 DIAGNOSIS — J209 Acute bronchitis, unspecified: Secondary | ICD-10-CM | POA: Diagnosis not present

## 2016-07-24 MED ORDER — PREDNISONE 20 MG PO TABS: ORAL_TABLET | ORAL | 0 refills | Status: DC

## 2016-07-24 MED ORDER — HYDROCOD POLST-CPM POLST ER 10-8 MG/5ML PO SUER: 5.0000 mL | Freq: Two times a day (BID) | ORAL | 0 refills | Status: DC | PRN

## 2016-07-24 NOTE — Patient Instructions (Signed)
Start Prednisone tablets for wheezing/shortness of breath. Take 2 tablets daily for 5 days.  You may take the Tussionex cough suppressant twice daily as needed for cough and rest. Caution this medication contains codeine and will make you feel drowsy.  Chest Congestion: Try taking Mucinex. This will help loosen up the mucous in your chest. Ensure you take this medication with a full glass of water.  Please call me Monday next week if you feel worse or no better.  It was a pleasure meeting you!

## 2016-07-24 NOTE — Progress Notes (Signed)
Pre visit review using our clinic review tool, if applicable. No additional management support is needed unless otherwise documented below in the visit note. 

## 2016-07-24 NOTE — Progress Notes (Signed)
Subjective:    Patient ID: [name], female    DOB: 1975-10-23, 41 y.o.   MRN: [id number]  HPI  [name] is a 41 year old female with a history of allergic rhinitis and asthma who presents today with a chief complaint of shortness of breath/wheezing. She also reports chest congestion, cough, dizziness, bilateral ear pain. She's not sleeping well at night. Her symptoms have been present for a total of six days. Overall her cough and wheezing has improved today, she is generally feeling better today. She's been using her nebulizer, Singular, Symbicort, Delsym.   She was evaluated on 07/20/16 at Urgent Care with a one day history of fever, cough, shortness of breath. She and her husband just returned from a cruise to [place]. She was treated with a nebulizer treatment that day and provided with a prescription for Tamiflu for presumed influenza. She underwent chest xray which showed evidence of slight viral bronchitis.   Review of Systems  Constitutional: Negative for chills, fatigue and fever.  HENT: Positive for congestion and ear pain. Negative for rhinorrhea, sinus pressure and sore throat.   Respiratory: Positive for cough, chest tightness, shortness of breath and wheezing.   Neurological: Positive for dizziness.       Past Medical History:  Diagnosis Date  . Acne   . Allergic rhinitis   . Anxiety   . Asthma   . Blood in stool   . Chronic bronchitis   . Dyspnea    At times  . Dysrhythmia    Tachycardia on bystolic for  . GERD (gastroesophageal reflux disease)   . HLD (hyperlipidemia)   . Pneumonia    hx of  . Pre-diabetes    On metformin preventively  . Seasonal allergies      Social History   Social History  . Marital status: Married    Spouse name: N/A  . Number of children: N/A  . Years of education: N/A   Occupational History  . Not on file.   Social History Main Topics  . Smoking status: Never Smoker  . Smokeless tobacco: Never Used  . Alcohol use No   . Drug use: No  . Sexual activity: Yes    Birth control/ protection: Pill   Other Topics Concern  . Not on file   Social History Narrative   No regular exercise    Past Surgical History:  Procedure Laterality Date  . ANAL FISSURE REPAIR N/A 05/23/2016   Procedure: Lateral Internal Sphincterotomy and Excision of Internal Tag;  Surgeon: Avel Peace, MD;  Location: [facility];  Service: General;  Laterality: N/A;  . CHOLECYSTECTOMY    . CYST REMOVAL TRUNK     Bottom of spine  . HEMORROIDECTOMY    . TONSILLECTOMY      Family History  Problem Relation Age of Onset  . Heart attack Father   . Arthritis Other     Family hx  . Diabetes Other     1st degree relative  . Hypertension Other     Family hx  . Stroke Other     1st degree relative < 50  . Other      Cardiovascular disorder--Family hx    Allergies  Allergen Reactions  . Levalbuterol Shortness Of Breath  . Buspar [Buspirone Hcl]      Fainting feelings   . Sulfamethoxazole     REACTION: unspecified  . Sulfonamide Derivatives     REACTION: rash    Current Outpatient Prescriptions on File  Prior to Visit  Medication Sig Dispense Refill  . albuterol (PROVENTIL HFA;VENTOLIN HFA) 108 (90 Base) MCG/ACT inhaler Inhale 2 puffs into the lungs every 6 (six) hours as needed for wheezing or shortness of breath. 1 Inhaler 0  . albuterol (PROVENTIL) (2.5 MG/3ML) 0.083% nebulizer solution INHALE CONTENTS OF 1 VIAL VIA NEBULIZER EVERY 6 HOURS AS NEEDED FOR WHEEZING 150 mL 2  . atorvastatin (LIPITOR) 20 MG tablet TAKE 1 TABLET (20 MG TOTAL) BY MOUTH DAILY. 90 tablet 1  . budesonide-formoterol (SYMBICORT) 160-4.5 MCG/ACT inhaler Inhale 2 puffs into the lungs 2 (two) times daily. 1 Inhaler 3  . cetirizine (ZYRTEC) 10 MG tablet Take 10 mg by mouth daily.      . diphenhydrAMINE (BENADRYL) 25 mg capsule Take 25 mg by mouth daily as needed for allergies.    Marland Kitchen docusate sodium (COLACE) 100 MG capsule Take 200 mg by mouth 2 (two) times  daily.     . metFORMIN (GLUCOPHAGE) 500 MG tablet Take 1 tablet (500 mg total) by mouth daily with breakfast. 90 tablet 3  . montelukast (SINGULAIR) 10 MG tablet TAKE 1 TABLET (10 MG TOTAL) BY MOUTH AT BEDTIME. 30 tablet 4  . naproxen sodium (ANAPROX) 220 MG tablet Take 440 mg by mouth daily as needed (pain).    . nebivolol (BYSTOLIC) 10 MG tablet Take 1 tablet (10 mg total) by mouth daily. 90 tablet 3  . omeprazole (PRILOSEC OTC) 20 MG tablet Take 20 mg by mouth daily as needed (indigestion).     Marland Kitchen oseltamivir (TAMIFLU) 75 MG capsule Take 1 capsule (75 mg total) by mouth every 12 (twelve) hours. 10 capsule 0  . OVER THE COUNTER MEDICATION Take 1 capsule by mouth at bedtime. Ultra Flora Probiotic    . YASMIN 28 3-0.03 MG tablet TAKE 1 TABLET BY MOUTH DAILY. 28 tablet 11   No current facility-administered medications on file prior to visit.     BP 122/76   Pulse 74   Temp 98.4 F (36.9 C) (Oral)   Ht 5' 2.25" (1.581 m)   Wt 266 lb 6.4 oz (120.8 kg)   LMP  (LMP Unknown)   SpO2 97%   BMI 48.33 kg/m    Objective:   Physical Exam  Constitutional: She appears well-nourished. She does not appear ill.  HENT:  Right Ear: Tympanic membrane and ear canal normal.  Left Ear: Tympanic membrane and ear canal normal.  Nose: Right sinus exhibits no maxillary sinus tenderness and no frontal sinus tenderness. Left sinus exhibits no maxillary sinus tenderness and no frontal sinus tenderness.  Mouth/Throat: Oropharynx is clear and moist.  Eyes: Conjunctivae are normal.  Neck: Neck supple.  Cardiovascular: Normal rate and regular rhythm.   Pulmonary/Chest: Effort normal. She has no decreased breath sounds. She has wheezes in the right upper field and the left upper field. She has rhonchi in the right upper field, the right lower field, the left upper field and the left lower field. She has no rales.  Lymphadenopathy:    She has no cervical adenopathy.  Skin: Skin is warm and dry.            Assessment & Plan:  Acute Viral Bronchitis with Asthma Exacerbation:  Treated at Urgent Care 5 days ago with Tamiflu. Started feeling better this morning. Exam today with wheezing and rhonchi, mild-moderate. She does not appear acutely ill and her vitals are normal.  Still suspect viral cause with asthma flare. Treat with Prednisone burst x 5 days, Mucinex,  Tussionex BID with drowsiness precautions. She will update if no improvement by Monday next week. Work note provided.  Urgent Care notes and imaging reviewed. Morrie Sheldon, NP

## 2016-09-26 ENCOUNTER — Other Ambulatory Visit: Payer: Self-pay | Admitting: Family Medicine

## 2016-09-30 ENCOUNTER — Other Ambulatory Visit: Payer: Self-pay | Admitting: Family Medicine

## 2016-10-20 ENCOUNTER — Ambulatory Visit: Payer: BC Managed Care – PPO | Admitting: Physical Therapy

## 2016-10-23 ENCOUNTER — Other Ambulatory Visit: Payer: Self-pay | Admitting: *Deleted

## 2016-10-23 MED ORDER — YASMIN 28 3-0.03 MG PO TABS: ORAL_TABLET | ORAL | 0 refills | Status: DC

## 2016-10-27 ENCOUNTER — Other Ambulatory Visit: Payer: Self-pay | Admitting: Family Medicine

## 2016-10-28 ENCOUNTER — Ambulatory Visit: Payer: BC Managed Care – PPO | Admitting: Physical Therapy

## 2016-11-05 ENCOUNTER — Ambulatory Visit: Payer: BC Managed Care – PPO | Attending: General Surgery | Admitting: Physical Therapy

## 2016-11-05 ENCOUNTER — Encounter: Payer: Self-pay | Admitting: Physical Therapy

## 2016-11-05 DIAGNOSIS — R252 Cramp and spasm: Secondary | ICD-10-CM

## 2016-11-05 DIAGNOSIS — R278 Other lack of coordination: Secondary | ICD-10-CM | POA: Insufficient documentation

## 2016-11-05 DIAGNOSIS — M6281 Muscle weakness (generalized): Secondary | ICD-10-CM | POA: Diagnosis not present

## 2016-11-05 NOTE — Patient Instructions (Addendum)
Bediot for toilet  Lidocaine cream  Coconut oil or vitamin E  About Abdominal Massage  Abdominal massage, also called external colon massage, is a self-treatment circular massage technique that can reduce and eliminate gas and ease constipation. The colon naturally contracts in waves in a clockwise direction starting from inside the right hip, moving up toward the ribs, across the belly, and down inside the left hip.  When you perform circular abdominal massage, you help stimulate your colon's normal wave pattern of movement called peristalsis.  It is most beneficial when done after eating.  Positioning You can practice abdominal massage with oil while lying down, or in the shower with soap.  Some people find that it is just as effective to do the massage through clothing while sitting or standing.  How to Massage Start by placing your finger tips or knuckles on your right side, just inside your hip bone.  . Make small circular movements while you move upward toward your rib cage.   . Once you reach the bottom right side of your rib cage, take your circular movements across to the left side of the bottom of your rib cage.  . Next, move downward until you reach the inside of your left hip bone.  This is the path your feces travel in your colon. . Continue to perform your abdominal massage in this pattern for 10 minutes each day.     You can apply as much pressure as is comfortable in your massage.  Start gently and build pressure as you continue to practice.  Notice any areas of pain as you massage; areas of slight pain may be relieved as you massage, but if you have areas of significant or intense pain, consult with your healthcare provider.  Other Considerations . General physical activity including bending and stretching can have a beneficial massage-like effect on the colon.  Deep breathing can also stimulate the colon because breathing deeply activates the same nervous system that  supplies the colon.   . Abdominal massage should always be used in combination with a bowel-conscious diet that is high in the proper type of fiber for you, fluids (primarily water), and a regular exercise program.   Pelvic Floor Release Stretches (NEW)  [employer]  Bear Down    Exhaling, bear down as if to have a bowel movement. Then contract holding 3 sec.  Repeat _10__ times. Do _2__ times a day.  Copyright  VHI. All rights reserved.  [facility] [address], [address] [place], [place] [address] Phone # [phone] Fax [phone]

## 2016-11-05 NOTE — Therapy (Signed)
[facility] [address] [place], [place], [address] Phone: [phone]   Fax:  [phone]  Physical Therapy Evaluation  Patient Details  Name: [name] MRN: [id number] Date of Birth: 1975/11/11 Referring Provider: Dr. Avel Peace  Encounter Date: 11/05/2016      PT End of Session - 11/05/16 1321    Visit Number 1   Date for PT Re-Evaluation 01/28/17   Authorization Type BCBS   PT Start Time 1145   PT Stop Time 1225   PT Time Calculation (min) 40 min   Activity Tolerance Patient tolerated treatment well   Behavior During Therapy [facility] for tasks assessed/performed      Past Medical History:  Diagnosis Date  . Acne   . Allergic rhinitis   . Anxiety   . Asthma   . Blood in stool   . Chronic bronchitis   . Dyspnea    At times  . Dysrhythmia    Tachycardia on bystolic for  . GERD (gastroesophageal reflux disease)   . HLD (hyperlipidemia)   . Pneumonia    hx of  . Pre-diabetes    On metformin preventively  . Seasonal allergies     Past Surgical History:  Procedure Laterality Date  . ANAL FISSURE REPAIR N/A 05/23/2016   Procedure: Lateral Internal Sphincterotomy and Excision of Internal Tag;  Surgeon: Avel Peace, MD;  Location: [facility];  Service: General;  Laterality: N/A;  . CHOLECYSTECTOMY    . CYST REMOVAL TRUNK     Bottom of spine  . HEMORROIDECTOMY    . TONSILLECTOMY      There were no vitals filed for this visit.       Subjective Assessment - 11/05/16 1149    Subjective My surgeon suggested for me to come. Surgery went fine.  I am going to the bathroom correctly. MD gave me exercises to do. I am doing my degels 1 time per day. I have fecal leakage. Whe I have gas stool will leak. Patient has leakage 2-3 times per week.  I will feel cahffed and needles in the anus.    Patient Stated Goals reduce leakage   Currently in Pain? Yes   Pain Score 3    Pain Location Rectum   Pain  Orientation Mid   Pain Descriptors / Indicators Burning  chaffed   Pain Type Acute pain   Pain Onset More than a month ago   Pain Frequency Intermittent   Aggravating Factors  when having reaction to fecal leakage   Pain Relieving Factors bath, cream   Multiple Pain Sites No            [facility] PT Assessment - 11/05/16 0001      Assessment   Medical Diagnosis K60.1 chronic ana fissure   Referring Provider Dr. Avel Peace   Onset Date/Surgical Date 05/23/16   Prior Therapy None     Precautions   Precautions None     Restrictions   Weight Bearing Restrictions No     Balance Screen   Has the patient fallen in the past 6 months No   Has the patient had a decrease in activity level because of a fear of falling?  No   Is the patient reluctant to leave their home because of a fear of falling?  No     Home [employer] information [employer] manager residence     Prior Function   Level of Independence Independent   Leisure no exercise  Cognition   Overall Cognitive Status Within Functional Limits for tasks assessed     Observation/Other Assessments   Focus on Therapeutic Outcomes (FOTO)  34% limitation  goal is 34% limitation     Posture/Postural Control   Posture/Postural Control No significant limitations     ROM / Strength   AROM / PROM / Strength AROM;PROM;Strength     PROM   PROM Assessment Site Hip   Right Hip External Rotation  45   Left Hip External Rotation  40     Right Hip   Right Hip Extension 3            Objective measurements completed on examination: See above findings.        Pelvic Floor Special Questions - 11/05/16 0001    Currently Sexually Active No  when she was, she had pain on entrance   Urinary Leakage Yes   Fecal incontinence Yes  3 times per week                  PT Education - 11/05/16 1225    Education provided Yes   Education Details abdominal massage, pelvic floor relaxtion with contracting,  stretching video, ways to manage rectal burning   Person(s) Educated Patient   Methods Explanation;Demonstration;Verbal cues;Handout   Comprehension Returned demonstration;Verbalized understanding          PT Short Term Goals - 11/05/16 1322      PT SHORT TERM GOAL #1   Title independent with flexibility exercises   Time 4   Period Weeks   Status New   Target Date 12/03/16     PT SHORT TERM GOAL #2   Title understand ways to manage the rectal pain due to fecal leakage   Time 4   Period Weeks   Status New   Target Date 12/03/16     PT SHORT TERM GOAL #3   Title understand how to perform abdominal massage to prevent constipation   Time 4   Period Weeks   Status New   Target Date 12/03/16     PT SHORT TERM GOAL #4   Title ability to bulge and contract pelvic floor muscles    Time 4   Period Weeks   Status New   Target Date 12/03/16           PT Long Term Goals - 11/05/16 1324      PT LONG TERM GOAL #1   Title independent with HEP    Time 12   Period Weeks   Status New   Target Date 01/28/17     PT LONG TERM GOAL #2   Title fecal leakage is minimal to none due to improved contraction and relaxation for the anal musculature   Time 12   Period Weeks   Status New   Target Date 01/28/17     PT LONG TERM GOAL #3   Title rectal pain from fecal leakage improved >/= 75% due to understanding how to take care of the skin   Time 12   Period Weeks   Status New   Target Date 01/28/17     PT LONG TERM GOAL #4   Title FOTO score </= 34% limitation   Time 12   Period Weeks   Status New   Target Date 01/28/17                Plan - 11/05/16 1226    Clinical Impression Statement Patient is a 41 year  old female with chronic anal fissure.  Patient reports he had anal fissure repair on 05/23/2016 that has helped with her bowel movements.  Patient now has fecal leakage when she passes gas and will leak stool 3 times per week.  Patient reports intermittently she  will leak urine.  Patient reports she will have a sore burning pain in the rectal area from the stool leakage and bowel movements at level 3/10.  Patient deferred therapist to assess muscle strength and movement in the rectal area due to not being comfortable.  Patient has tight hips for external rotation and right hip extension.  Patient has weakness in right hip extension.  Patient will benefit from skilled therapy to reduce fecal leakage and how to manage skin irritation.    History and Personal Factors relevant to plan of care: Anal fissure repair 05/23/2016; hemorroidectomy   Clinical Presentation Stable   Clinical Presentation due to: stable condition   Clinical Decision Making Low   Rehab Potential Excellent   Clinical Impairments Affecting Rehab Potential Anal fissure repair 05/23/2016; hemorroidectomy   PT Frequency 1x / week   PT Duration 12 weeks   PT Treatment/Interventions Biofeedback;Therapeutic activities;Therapeutic exercise;Neuromuscular re-education;Patient/family education;Manual techniques;Dry needling   PT Next Visit Plan bowel health, pelvic floor EMG, toileting techique   PT Home Exercise Plan progress as needed   Consulted and Agree with Plan of Care Patient      Patient will benefit from skilled therapeutic intervention in order to improve the following deficits and impairments:  Pain, Decreased strength, Decreased mobility, Decreased activity tolerance, Increased fascial restricitons  Visit Diagnosis: Muscle weakness (generalized) - Plan: PT plan of care cert/re-cert  Cramp and spasm - Plan: PT plan of care cert/re-cert  Other lack of coordination - Plan: PT plan of care cert/re-cert     Problem List Patient Active Problem List   Diagnosis Date Noted  . Ringworm 11/06/2015  . Well woman exam 11/01/2014  . History of anal fissures 11/01/2014  . Elevated TSH 11/01/2014  . Elevated hemoglobin A1c 10/20/2013  . Insomnia 08/12/2013  . Obesity 02/27/2011  . HLD  (hyperlipidemia) 12/15/2006  . ALLERGIC RHINITIS 12/15/2006  . Asthma 12/15/2006  . GERD 12/15/2006    Eulis Fosterheryl Julicia Krieger, PT 11/05/16 1:29 PM   [facility] [address] [place], [place], [address] Phone: [phone]   Fax:  [phone]  Name: [name] MRN: [id number] Date of Birth: 1976/01/01

## 2016-11-16 ENCOUNTER — Other Ambulatory Visit: Payer: Self-pay | Admitting: Family Medicine

## 2016-11-17 ENCOUNTER — Other Ambulatory Visit: Payer: Self-pay | Admitting: Family Medicine

## 2016-11-19 ENCOUNTER — Ambulatory Visit: Payer: BC Managed Care – PPO | Admitting: Physical Therapy

## 2016-11-26 ENCOUNTER — Encounter: Payer: BC Managed Care – PPO | Admitting: Family Medicine

## 2016-11-26 ENCOUNTER — Other Ambulatory Visit: Payer: Self-pay | Admitting: Family Medicine

## 2016-12-03 ENCOUNTER — Ambulatory Visit: Payer: BC Managed Care – PPO | Attending: General Surgery | Admitting: Physical Therapy

## 2016-12-03 ENCOUNTER — Encounter: Payer: Self-pay | Admitting: Physical Therapy

## 2016-12-03 DIAGNOSIS — R252 Cramp and spasm: Secondary | ICD-10-CM | POA: Diagnosis not present

## 2016-12-03 DIAGNOSIS — M6281 Muscle weakness (generalized): Secondary | ICD-10-CM | POA: Diagnosis not present

## 2016-12-03 DIAGNOSIS — R278 Other lack of coordination: Secondary | ICD-10-CM | POA: Insufficient documentation

## 2016-12-03 NOTE — Therapy (Addendum)
[facility] [address] [place], [place], [address] Phone: [phone]   Fax:  [phone]  Physical Therapy Treatment  Patient Details  Name: [name] MRN: [id number] Date of Birth: 01-05-1976 Referring Provider: Dr. Jackolyn Confer  Encounter Date: 12/03/2016      PT End of Session - 12/03/16 1100    Visit Number 2   Date for PT Re-Evaluation 01/28/17   Authorization Type BCBS   PT Start Time 1025  came late   PT Stop Time 1100   PT Time Calculation (min) 35 min   Activity Tolerance Patient tolerated treatment well   Behavior During Therapy [facility] for tasks assessed/performed      Past Medical History:  Diagnosis Date  . Acne   . Allergic rhinitis   . Anxiety   . Asthma   . Blood in stool   . Chronic bronchitis   . Dyspnea    At times  . Dysrhythmia    Tachycardia on bystolic for  . GERD (gastroesophageal reflux disease)   . HLD (hyperlipidemia)   . Pneumonia    hx of  . Pre-diabetes    On metformin preventively  . Seasonal allergies     Past Surgical History:  Procedure Laterality Date  . ANAL FISSURE REPAIR N/A 05/23/2016   Procedure: Lateral Internal Sphincterotomy and Excision of Internal Tag;  Surgeon: Jackolyn Confer, MD;  Location: [facility];  Service: General;  Laterality: N/A;  . CHOLECYSTECTOMY    . CYST REMOVAL TRUNK     Bottom of spine  . HEMORROIDECTOMY    . TONSILLECTOMY      There were no vitals filed for this visit.      Subjective Assessment - 12/03/16 1025    Subjective I am the same due to alot of issues. When they start school it will be easier to focus on me.  I will be joining a gym. I have done the Kegel exercise 1 time every other day. I only get pain with the chaffing feeling.    Patient Stated Goals reduce leakage   Currently in Pain? No/denies                         [facility] Adult PT Treatment/Exercise - 12/03/16 0001      Manual Therapy    Manual Therapy Soft tissue mobilization;Manual Lymphatic Drainage (MLD)   Soft tissue mobilization abdominal massage to stimulate the colon and release fascia   Manual Lymphatic Drainage (MLD) lymph drainage and colon sitmulation massage                PT Education - 12/03/16 1059    Education provided Yes   Education Details you tube video for hip stretches   Person(s) Educated Patient   Methods Explanation   Comprehension Verbalized understanding          PT Short Term Goals - 12/03/16 1031      PT SHORT TERM GOAL #1   Title independent with flexibility exercises   Time 4   Period Weeks   Status On-going  not consistent     PT SHORT TERM GOAL #2   Title understand ways to manage the rectal pain due to fecal leakage   Time 4   Period Weeks   Status On-going     PT SHORT TERM GOAL #3   Title understand how to perform abdominal massage to prevent constipation   Time 4  Period Weeks   Status On-going     PT SHORT TERM GOAL #4   Title ability to bulge and contract pelvic floor muscles    Time 4   Period Weeks   Status New           PT Long Term Goals - 11/05/16 1324      PT LONG TERM GOAL #1   Title independent with HEP    Time 12   Period Weeks   Status New   Target Date 01/28/17     PT LONG TERM GOAL #2   Title fecal leakage is minimal to none due to improved contraction and relaxation for the anal musculature   Time 12   Period Weeks   Status New   Target Date 01/28/17     PT LONG TERM GOAL #3   Title rectal pain from fecal leakage improved >/= 75% due to understanding how to take care of the skin   Time 12   Period Weeks   Status New   Target Date 01/28/17     PT LONG TERM GOAL #4   Title FOTO score </= 34% limitation   Time 12   Period Weeks   Status New   Target Date 01/28/17               Plan - 12/03/16 1100    Clinical Impression Statement Patient had not been able to do her exercise program due to her busy  schedule.  Patient has not met goals yet due to not doing her home program.  Patient had decreased abdominal swelling after soft tissue work.  Patient will start her home program next week when kids return to school. Patient will benefit from skilled therapy to reduce fecal leakage and how to manage skin irritation.    Rehab Potential Excellent   Clinical Impairments Affecting Rehab Potential Anal fissure repair 05/23/2016; hemorroidectomy   PT Frequency 1x / week   PT Duration 12 weeks   PT Treatment/Interventions Biofeedback;Therapeutic activities;Therapeutic exercise;Neuromuscular re-education;Patient/family education;Manual techniques;Dry needling   PT Next Visit Plan bowel health, pelvic floor EMG, toileting techique; soft tissue work   PT Home Exercise Plan progress as needed   Recommended Other Services mD signed initial eval   Consulted and Agree with Plan of Care Patient      Patient will benefit from skilled therapeutic intervention in order to improve the following deficits and impairments:  Pain, Decreased strength, Decreased mobility, Decreased activity tolerance, Increased fascial restricitons  Visit Diagnosis: Muscle weakness (generalized)  Cramp and spasm  Other lack of coordination     Problem List Patient Active Problem List   Diagnosis Date Noted  . Ringworm 11/06/2015  . Well woman exam 11/01/2014  . History of anal fissures 11/01/2014  . Elevated TSH 11/01/2014  . Elevated hemoglobin A1c 10/20/2013  . Insomnia 08/12/2013  . Obesity 02/27/2011  . HLD (hyperlipidemia) 12/15/2006  . ALLERGIC RHINITIS 12/15/2006  . Asthma 12/15/2006  . GERD 12/15/2006    [name], PT 12/03/16 11:04 AM   [facility] [address], [place], [place], [address] Phone: [phone]   Fax:  [phone]  Name: [name] MRN: [id number] Date of Birth: 09-Dec-1975  PHYSICAL THERAPY DISCHARGE SUMMARY  Visits  from Start of Care: 2  Current functional level related to goals / functional outcomes: See above. Patient has no-showed and canceled several appointments.  Unable to assess patient due to her not returning since 12/03/2016.  Remaining deficits: See above.    Education / Equipment: HEP Plan:                                                    Patient goals were not met. Patient is being discharged due to not returning since the last visit.  Thank you for the referral. [name], PT 01/29/17 9:57 AM  ?????

## 2016-12-03 NOTE — Patient Instructions (Addendum)
Pelvic Floor Release Stretches (NEW)  [employer]  [facility] [address] [place], [place] [address] Phone # [phone] Fax [phone]

## 2016-12-09 ENCOUNTER — Encounter: Payer: BC Managed Care – PPO | Admitting: Family Medicine

## 2016-12-17 ENCOUNTER — Encounter: Payer: BC Managed Care – PPO | Admitting: Physical Therapy

## 2016-12-18 ENCOUNTER — Other Ambulatory Visit: Payer: Self-pay | Admitting: Family Medicine

## 2016-12-19 ENCOUNTER — Other Ambulatory Visit: Payer: Self-pay | Admitting: Family Medicine

## 2016-12-19 ENCOUNTER — Other Ambulatory Visit (INDEPENDENT_AMBULATORY_CARE_PROVIDER_SITE_OTHER): Payer: BLUE CROSS/BLUE SHIELD

## 2016-12-19 DIAGNOSIS — R7309 Other abnormal glucose: Secondary | ICD-10-CM | POA: Diagnosis not present

## 2016-12-19 DIAGNOSIS — Z01419 Encounter for gynecological examination (general) (routine) without abnormal findings: Secondary | ICD-10-CM | POA: Diagnosis not present

## 2016-12-19 DIAGNOSIS — R946 Abnormal results of thyroid function studies: Secondary | ICD-10-CM

## 2016-12-19 DIAGNOSIS — E785 Hyperlipidemia, unspecified: Secondary | ICD-10-CM

## 2016-12-19 DIAGNOSIS — R7989 Other specified abnormal findings of blood chemistry: Secondary | ICD-10-CM

## 2016-12-19 LAB — COMPREHENSIVE METABOLIC PANEL
ALBUMIN: 3.8 g/dL (ref 3.5–5.2)
ALK PHOS: 83 U/L (ref 39–117)
ALT: 16 U/L (ref 0–35)
AST: 35 U/L (ref 0–37)
BILIRUBIN TOTAL: 0.4 mg/dL (ref 0.2–1.2)
BUN: 8 mg/dL (ref 6–23)
CO2: 28 mEq/L (ref 19–32)
CREATININE: 0.63 mg/dL (ref 0.40–1.20)
Calcium: 9.3 mg/dL (ref 8.4–10.5)
Chloride: 100 mEq/L (ref 96–112)
GFR: 110.38 mL/min (ref >60.00)
Glucose, Bld: 141 mg/dL — ABNORMAL HIGH (ref 70–99)
Potassium: 4.4 mEq/L (ref 3.5–5.1)
SODIUM: 138 meq/L (ref 135–145)
TOTAL PROTEIN: 7 g/dL (ref 6.0–8.3)

## 2016-12-19 LAB — CBC WITH DIFFERENTIAL/PLATELET
Basophils Absolute: 0 10*3/uL (ref 0.0–0.1)
Basophils Relative: 0.2 % (ref 0.0–3.0)
EOS ABS: 0.3 10*3/uL (ref 0.0–0.7)
EOS PCT: 3.2 % (ref 0.0–5.0)
HEMATOCRIT: 39.8 % (ref 36.0–46.0)
Hemoglobin: 12.9 g/dL (ref 12.0–15.0)
LYMPHS PCT: 40.7 % (ref 12.0–46.0)
Lymphs Abs: 3.4 10*3/uL (ref 0.7–4.0)
MCHC: 32.4 g/dL (ref 30.0–36.0)
MCV: 86.5 fl (ref 78.0–100.0)
Monocytes Absolute: 0.4 10*3/uL (ref 0.1–1.0)
Monocytes Relative: 4.8 % (ref 3.0–12.0)
Neutro Abs: 4.3 10*3/uL (ref 1.4–7.7)
Neutrophils Relative %: 51.1 % (ref 43.0–77.0)
Platelets: 220 10*3/uL (ref 150.0–400.0)
RBC: 4.6 Mil/uL (ref 3.87–5.11)
RDW: 13.7 % (ref 11.5–15.5)
WBC: 8.4 10*3/uL (ref 4.0–10.5)

## 2016-12-19 LAB — LIPID PANEL
CHOLESTEROL: 154 mg/dL (ref 0–200)
HDL: 53.2 mg/dL (ref >39.00)
LDL Cholesterol: 65 mg/dL (ref 0–99)
NonHDL: 100.83
Total CHOL/HDL Ratio: 3
Triglycerides: 178 mg/dL — ABNORMAL HIGH (ref 0.0–149.0)
VLDL: 35.6 mg/dL (ref 0.0–40.0)

## 2016-12-19 LAB — TSH: TSH: 5.52 u[IU]/mL — AB (ref 0.35–4.50)

## 2016-12-19 LAB — HEMOGLOBIN A1C: HEMOGLOBIN A1C: 6.5 % (ref 4.6–6.5)

## 2016-12-23 ENCOUNTER — Ambulatory Visit: Payer: BC Managed Care – PPO | Admitting: Physical Therapy

## 2016-12-24 ENCOUNTER — Encounter: Payer: BLUE CROSS/BLUE SHIELD | Admitting: Family Medicine

## 2016-12-24 ENCOUNTER — Other Ambulatory Visit: Payer: Self-pay

## 2016-12-24 MED ORDER — YASMIN 28 3-0.03 MG PO TABS: ORAL_TABLET | ORAL | 0 refills | Status: DC

## 2016-12-24 MED ORDER — MONTELUKAST SODIUM 10 MG PO TABS: 10.0000 mg | ORAL_TABLET | Freq: Every day | ORAL | 0 refills | Status: DC

## 2016-12-24 NOTE — Telephone Encounter (Signed)
Pt has started her menstrual cycle and is scheduled for CPX today but wants to cancel; pt rescheduled for 01/06/17 and request refill on singulair and yasmin to [facility]. Advised pt done.

## 2016-12-29 ENCOUNTER — Other Ambulatory Visit: Payer: Self-pay | Admitting: Family Medicine

## 2016-12-30 ENCOUNTER — Ambulatory Visit: Payer: BC Managed Care – PPO | Admitting: Physical Therapy

## 2017-01-06 ENCOUNTER — Ambulatory Visit (INDEPENDENT_AMBULATORY_CARE_PROVIDER_SITE_OTHER): Payer: BLUE CROSS/BLUE SHIELD | Admitting: Family Medicine

## 2017-01-06 ENCOUNTER — Encounter: Payer: Self-pay | Admitting: Family Medicine

## 2017-01-06 VITALS — BP 110/78 | HR 85 | Temp 98.6°F | Resp 16 | Wt 274.8 lb

## 2017-01-06 DIAGNOSIS — Z23 Encounter for immunization: Secondary | ICD-10-CM | POA: Diagnosis not present

## 2017-01-06 DIAGNOSIS — E119 Type 2 diabetes mellitus without complications: Secondary | ICD-10-CM

## 2017-01-06 DIAGNOSIS — Z01419 Encounter for gynecological examination (general) (routine) without abnormal findings: Secondary | ICD-10-CM | POA: Diagnosis not present

## 2017-01-06 DIAGNOSIS — R7309 Other abnormal glucose: Secondary | ICD-10-CM

## 2017-01-06 DIAGNOSIS — G4700 Insomnia, unspecified: Secondary | ICD-10-CM

## 2017-01-06 DIAGNOSIS — J45909 Unspecified asthma, uncomplicated: Secondary | ICD-10-CM

## 2017-01-06 DIAGNOSIS — E785 Hyperlipidemia, unspecified: Secondary | ICD-10-CM

## 2017-01-06 MED ORDER — MOMETASONE FURO-FORMOTEROL FUM 200-5 MCG/ACT IN AERO: 2.0000 | INHALATION_SPRAY | Freq: Every day | RESPIRATORY_TRACT | 3 refills | Status: DC

## 2017-01-06 NOTE — Progress Notes (Signed)
Subjective:    Patient ID: Cordelia Pen, female    DOB: January 11, 1976, 41 y.o.   MRN: [id number]  41 yo female here for CPX and follow up of chronic medical conditions.  Last pap smear done by me 11/01/14- normal. On Yasmin for OCPs.  She is pleased with this.  Diabetes, no longer pre diabetic- taking Metformin 500 mg daily. Denies GI side effects currently. Lab Results  Component Value Date   HGBA1C 6.5 12/19/2016   Palpitations have been controlled with Bystolic and uses diazepam as needed for refractory palpitations.  HLD-  On Lipitor 20 mg daily.  Very strong FH of HLD and hypertriglyceridemia.  Lab Results  Component Value Date   CHOL 154 12/19/2016   HDL 53.20 12/19/2016   LDLCALC 65 12/19/2016   LDLDIRECT 60.0 03/02/2014   TRIG 178.0 (H) 12/19/2016   CHOLHDL 3 12/19/2016   HTN- BP well controlled Bystolic.  Lab Results  Component Value Date   CREATININE 0.63 12/19/2016   Lab Results  Component Value Date   WBC 8.4 12/19/2016   HGB 12.9 12/19/2016   HCT 39.8 12/19/2016   MCV 86.5 12/19/2016   PLT 220.0 12/19/2016   Lab Results  Component Value Date   ALT 16 12/19/2016   AST 35 12/19/2016   ALKPHOS 83 12/19/2016   BILITOT 0.4 12/19/2016   Lab Results  Component Value Date   NA 138 12/19/2016   K 4.4 12/19/2016   CL 100 12/19/2016   CO2 28 12/19/2016     Anal fissures- sees Dr. Elnoria Howard.  Uses Nitroglycerin ointment which has been successful.  Asthma- symptoms are well controlled.  Wants to trestart dulera as she felt this worked best. Patient Active Problem List   Diagnosis Date Noted  . Well woman exam 11/01/2014  . History of anal fissures 11/01/2014  . Elevated TSH 11/01/2014  . Elevated hemoglobin A1c 10/20/2013  . Insomnia 08/12/2013  . Obesity 02/27/2011  . HLD (hyperlipidemia) 12/15/2006  . ALLERGIC RHINITIS 12/15/2006  . Asthma 12/15/2006  . GERD 12/15/2006   Past Medical History:  Diagnosis Date  . Acne   . Allergic rhinitis   .  Anxiety   . Asthma   . Blood in stool   . Chronic bronchitis   . Dyspnea    At times  . Dysrhythmia    Tachycardia on bystolic for  . GERD (gastroesophageal reflux disease)   . HLD (hyperlipidemia)   . Pneumonia    hx of  . Pre-diabetes    On metformin preventively  . Seasonal allergies    Past Surgical History:  Procedure Laterality Date  . ANAL FISSURE REPAIR N/A 05/23/2016   Procedure: Lateral Internal Sphincterotomy and Excision of Internal Tag;  Surgeon: Avel Peace, MD;  Location: [facility];  Service: General;  Laterality: N/A;  . CHOLECYSTECTOMY    . CYST REMOVAL TRUNK     Bottom of spine  . HEMORROIDECTOMY    . TONSILLECTOMY     Social History  Substance Use Topics  . Smoking status: Never Smoker  . Smokeless tobacco: Never Used  . Alcohol use No   Family History  Problem Relation Age of Onset  . Heart attack Father   . Arthritis Other        Family hx  . Diabetes Other        1st degree relative  . Hypertension Other        Family hx  . Stroke Other  1st degree relative < 50  . Other Unknown        Cardiovascular disorder--Family hx   Allergies  Allergen Reactions  . Levalbuterol Shortness Of Breath  . Buspar [Buspirone Hcl]      Fainting feelings   . Sulfamethoxazole     REACTION: unspecified  . Sulfonamide Derivatives     REACTION: rash   Current Outpatient Prescriptions on File Prior to Visit  Medication Sig Dispense Refill  . albuterol (PROVENTIL) (2.5 MG/3ML) 0.083% nebulizer solution INHALE CONTENTS OF 1 VIAL VIA NEBULIZER EVERY 6 HOURS AS NEEDED FOR WHEEZING 150 mL 2  . atorvastatin (LIPITOR) 20 MG tablet TAKE 1 TABLET (20 MG TOTAL) BY MOUTH DAILY. 90 tablet 1  . budesonide-formoterol (SYMBICORT) 160-4.5 MCG/ACT inhaler Inhale 2 puffs into the lungs 2 (two) times daily. 1 Inhaler 3  . BYSTOLIC 10 MG tablet TAKE 1 TABLET (10 MG TOTAL) BY MOUTH DAILY. 90 tablet 0  . cetirizine (ZYRTEC) 10 MG tablet Take 10 mg by mouth daily.      .  diphenhydrAMINE (BENADRYL) 25 mg capsule Take 25 mg by mouth daily as needed for allergies.    Marland Kitchen docusate sodium (COLACE) 100 MG capsule Take 200 mg by mouth 2 (two) times daily.     . metFORMIN (GLUCOPHAGE) 500 MG tablet TAKE 1 TABLET (500 MG TOTAL) BY MOUTH DAILY WITH BREAKFAST. 90 tablet 0  . montelukast (SINGULAIR) 10 MG tablet TAKE 1 TABLET BY MOUTH EVERYDAY AT BEDTIME 30 tablet 0  . naproxen sodium (ANAPROX) 220 MG tablet Take 440 mg by mouth daily as needed (pain).    Marland Kitchen omeprazole (PRILOSEC OTC) 20 MG tablet Take 20 mg by mouth daily as needed (indigestion).     Marland Kitchen OVER THE COUNTER MEDICATION Take 1 capsule by mouth at bedtime. Ultra Flora Probiotic    . PROAIR HFA 108 (90 Base) MCG/ACT inhaler INHALE 2 PUFFS INTO THE LUNGS EVERY 6 (SIX) HOURS AS NEEDED FOR WHEEZING OR SHORTNESS OF BREATH. 8.5 Inhaler 0  . YASMIN 28 3-0.03 MG tablet TAKE 1 TABLET BY MOUTH DAILY. COMPLETE PHYSICAL EXAMS REQUIRED FOR ADDITIONAL REFILLS 28 tablet 0   No current facility-administered medications on file prior to visit.    The PMH, PSH, Social History, Family History, Medications, and allergies have been reviewed in [facility], and have been updated if relevant.   Review of Systems   Review of Systems  Constitutional: Negative for unexpected weight change.  HENT: Negative.   Eyes: Negative.   Respiratory: Negative.   Cardiovascular: Negative.   Gastrointestinal: Negative for constipation.  Endocrine: Negative.   Genitourinary: Negative.   Musculoskeletal: Negative for myalgias.  Skin: Negative for rash.  Allergic/Immunologic: Negative.   Neurological: Negative.   Hematological: Negative.   Psychiatric/Behavioral: Negative.   All other systems reviewed and are negative.    Objective:   Physical Exam   There were no vitals taken for this visit.  Wt Readings from Last 3 Encounters:  07/24/16 266 lb 6.4 oz (120.8 kg)  05/23/16 262 lb (118.8 kg)  05/14/16 262 lb (118.8 kg)    Physical  Exam    General:  Well-developed,well-nourished,in no acute distress; alert,appropriate and cooperative throughout examination Head:  normocephalic and atraumatic.   Eyes:  vision grossly intact, PERRL Ears:  R ear normal and L ear normal externally, TMs clear bilaterally Nose:  no external deformity.   Mouth:  good dentition.   Neck:  No deformities, masses, or tenderness noted. Breasts:  No mass, nodules, thickening,  tenderness, bulging, retraction, inflamation, nipple discharge or skin changes noted.   Lungs:  Normal respiratory effort, chest expands symmetrically. Lungs are clear to auscultation, no crackles or wheezes. Heart:  Normal rate and regular rhythm. S1 and S2 normal without gallop, murmur, click, rub or other extra sounds. Abdomen:  Bowel sounds positive,abdomen soft and non-tender without masses, organomegaly or hernias noted. Msk:  No deformity or scoliosis noted of thoracic or lumbar spine.   Extremities:  No clubbing, cyanosis, edema, or deformity noted with normal full range of motion of all joints.   Neurologic:  alert & oriented X3 and gait normal.   Skin:  Intact without suspicious lesions or rashes Cervical Nodes:  No lymphadenopathy noted Axillary Nodes:  No palpable lymphadenopathy Psych:  Cognition and judgment appear intact. Alert and cooperative with normal attention span and concentration. No apparent delusions, illusions, hallucinations

## 2017-01-06 NOTE — Assessment & Plan Note (Signed)
Well controlled on current dose of statin. No changes made today. 

## 2017-01-06 NOTE — Assessment & Plan Note (Signed)
Reviewed preventive care protocols, scheduled due services, and updated immunizations Discussed nutrition, exercise, diet, and healthy lifestyle.  

## 2017-01-06 NOTE — Assessment & Plan Note (Signed)
eRx sent for dulera.

## 2017-01-06 NOTE — Assessment & Plan Note (Signed)
Continue Metformin- she will cut back on carbs and sugars.

## 2017-01-23 ENCOUNTER — Other Ambulatory Visit: Payer: Self-pay | Admitting: Family Medicine

## 2017-02-15 ENCOUNTER — Other Ambulatory Visit: Payer: Self-pay | Admitting: Family Medicine

## 2017-02-20 ENCOUNTER — Other Ambulatory Visit: Payer: Self-pay | Admitting: Family Medicine

## 2017-03-01 ENCOUNTER — Other Ambulatory Visit: Payer: Self-pay | Admitting: Family Medicine

## 2017-04-01 ENCOUNTER — Other Ambulatory Visit: Payer: Self-pay | Admitting: Family Medicine

## 2017-04-23 ENCOUNTER — Ambulatory Visit (INDEPENDENT_AMBULATORY_CARE_PROVIDER_SITE_OTHER): Payer: BLUE CROSS/BLUE SHIELD | Admitting: Primary Care

## 2017-04-23 ENCOUNTER — Encounter: Payer: Self-pay | Admitting: Primary Care

## 2017-04-23 VITALS — BP 122/78 | HR 78 | Temp 98.0°F | Wt 279.0 lb

## 2017-04-23 DIAGNOSIS — R000 Tachycardia, unspecified: Secondary | ICD-10-CM

## 2017-04-23 DIAGNOSIS — Z3041 Encounter for surveillance of contraceptive pills: Secondary | ICD-10-CM | POA: Diagnosis not present

## 2017-04-23 DIAGNOSIS — G4700 Insomnia, unspecified: Secondary | ICD-10-CM | POA: Diagnosis not present

## 2017-04-23 DIAGNOSIS — K219 Gastro-esophageal reflux disease without esophagitis: Secondary | ICD-10-CM

## 2017-04-23 DIAGNOSIS — E119 Type 2 diabetes mellitus without complications: Secondary | ICD-10-CM | POA: Diagnosis not present

## 2017-04-23 DIAGNOSIS — E785 Hyperlipidemia, unspecified: Secondary | ICD-10-CM

## 2017-04-23 DIAGNOSIS — J45909 Unspecified asthma, uncomplicated: Secondary | ICD-10-CM

## 2017-04-23 MED ORDER — DROSPIRENONE-ETHINYL ESTRADIOL 3-0.03 MG PO TABS: 1.0000 | ORAL_TABLET | Freq: Every day | ORAL | 3 refills | Status: DC

## 2017-04-23 NOTE — Assessment & Plan Note (Signed)
Stable on Melatonin.

## 2017-04-23 NOTE — Assessment & Plan Note (Signed)
Infrequent symptoms, will take Zantac as needed with relief. No recent omeprazole use.

## 2017-04-23 NOTE — Patient Instructions (Signed)
Schedule a lab only appointment in March for your repeat A1C.  Please schedule a physical with me in September 2019. You may also schedule a lab only appointment 3-4 days prior. We will discuss your lab results in detail during your physical.  Use ranitidine (Zantac) as needed for acid reflux.  Do not use the Symbicort inhaler, use Dulera instead.  It was a pleasure to meet you today! Please don't hesitate to call or message me with any questions.

## 2017-04-23 NOTE — Progress Notes (Signed)
Subjective:    Patient ID: [name], female    DOB: 1975-08-25, 42 y.o.   MRN: [id number]  HPI  [name] is a 42 year old female who presents today to transfer care from Dr. Dayton MartesAron.  1) Asthma: Currently managed on albuterol inhaler, albuterol nebulized treatments, Dulera, Singulair 10 mg. She uses her nebulized treatments typically during colds. She's using her albuterol inhaler none to twice weekly on average, worse during colds.  2) Type 2 Diabetes: Currently managed on metformin  500 mg once daily. Her last A1C was 6.5 in September 2018.   3) Tachycardia: History of feeling "faint" with palpitations in the past. Saw cardiology who had her wear a holter monitor which was unremarkable. Currently managed on Bystolic 10 mg. She's had no problems on the Bystolic. She avoids caffeine.   4) Hyperlipidemia: Currently managed on atorvastatin 20 mg. Her last lipid panel in September 2018 with TC of 154 and LDL of 65. She denies myalgias.  Review of Systems  Constitutional: Negative for fatigue.  Respiratory: Negative for shortness of breath and wheezing.   Cardiovascular: Negative for chest pain and palpitations.  Musculoskeletal: Negative for myalgias.  Neurological: Negative for numbness.       Past Medical History:  Diagnosis Date  . Acne   . Allergic rhinitis   . Anxiety   . Asthma   . Blood in stool   . Chronic bronchitis   . Dyspnea    At times  . Dysrhythmia    Tachycardia on bystolic for  . GERD (gastroesophageal reflux disease)   . HLD (hyperlipidemia)   . Pneumonia    hx of  . Pre-diabetes    On metformin preventively  . Seasonal allergies      Social History   Socioeconomic History  . Marital status: Married    Spouse name: Not on file  . Number of children: Not on file  . Years of education: Not on file  . Highest education level: Not on file  Social Needs  . Financial resource strain: Not on file  . Food insecurity - worry: Not on file  . Food  insecurity - inability: Not on file  . Transportation needs - medical: Not on file  . Transportation needs - non-medical: Not on file  Occupational History  . Not on file  Tobacco Use  . Smoking status: Never Smoker  . Smokeless tobacco: Never Used  Substance and Sexual Activity  . Alcohol use: No    Alcohol/week: 0.0 oz  . Drug use: No  . Sexual activity: Yes    Birth control/protection: Pill  Other Topics Concern  . Not on file  Social History Narrative   No regular exercise    Past Surgical History:  Procedure Laterality Date  . ANAL FISSURE REPAIR N/A 05/23/2016   Procedure: Lateral Internal Sphincterotomy and Excision of Internal Tag;  Surgeon: Avel Peaceodd Rosenbower, MD;  Location: [facility];  Service: General;  Laterality: N/A;  . CHOLECYSTECTOMY    . CYST REMOVAL TRUNK     Bottom of spine  . HEMORROIDECTOMY    . TONSILLECTOMY      Family History  Problem Relation Age of Onset  . Heart attack Father   . Arthritis Other        Family hx  . Diabetes Other        1st degree relative  . Hypertension Other        Family hx  . Stroke Other  1st degree relative < 50  . Other Unknown        Cardiovascular disorder--Family hx    Allergies  Allergen Reactions  . Levalbuterol Shortness Of Breath  . Buspar [Buspirone Hcl]      Fainting feelings   . Sulfamethoxazole     REACTION: unspecified  . Sulfonamide Derivatives     REACTION: rash    Current Outpatient Medications on File Prior to Visit  Medication Sig Dispense Refill  . albuterol (PROVENTIL) (2.5 MG/3ML) 0.083% nebulizer solution INHALE CONTENTS OF 1 VIAL VIA NEBULIZER EVERY 6 HOURS AS NEEDED FOR WHEEZING 150 mL 2  . atorvastatin (LIPITOR) 20 MG tablet TAKE 1 TABLET (20 MG TOTAL) BY MOUTH DAILY. 90 tablet 1  . BYSTOLIC 10 MG tablet TAKE 1 TABLET BY MOUTH EVERY DAY 90 tablet 3  . cetirizine (ZYRTEC) 10 MG tablet Take 10 mg by mouth daily.      Marland Kitchen docusate sodium (COLACE) 100 MG capsule Take 200 mg by mouth 2  (two) times daily.     . metFORMIN (GLUCOPHAGE) 500 MG tablet TAKE 1 TABLET BY MOUTH EVERY DAY WITH BREAKFAST 90 tablet 1  . mometasone-formoterol (DULERA) 200-5 MCG/ACT AERO Inhale 2 puffs into the lungs daily. 1 Inhaler 3  . montelukast (SINGULAIR) 10 MG tablet TAKE 1 TABLET BY MOUTH EVERYDAY AT BEDTIME 30 tablet 1  . naproxen sodium (ANAPROX) 220 MG tablet Take 440 mg by mouth daily as needed (pain).    Marland Kitchen OVER THE COUNTER MEDICATION Take 1 capsule by mouth at bedtime. Ultra Flora Probiotic    . PROAIR HFA 108 (90 Base) MCG/ACT inhaler INHALE 2 PUFFS INTO THE LUNGS EVERY 6 (SIX) HOURS AS NEEDED FOR WHEEZING OR SHORTNESS OF BREATH. 8.5 Inhaler 0   No current facility-administered medications on file prior to visit.     BP 122/78   Pulse 78   Temp 98 F (36.7 C) (Oral)   Wt 279 lb (126.6 kg)   LMP 04/18/2017   SpO2 98%   BMI 50.62 kg/m    Objective:   Physical Exam  Constitutional: She appears well-nourished.  Neck: Neck supple.  Cardiovascular: Normal rate and regular rhythm.  Pulmonary/Chest: Effort normal and breath sounds normal.  Skin: Skin is warm and dry.  Psychiatric: She has a normal mood and affect.          Assessment & Plan:

## 2017-04-24 NOTE — Assessment & Plan Note (Signed)
A1C in September stable, repeat A1C in March 2019. Discussed the importance of a healthy diet and regular exercise in order for weight loss, and to reduce the risk of any potential medical problems.

## 2017-04-24 NOTE — Assessment & Plan Note (Signed)
Lipid panel stable on atorvastatin, continue same.

## 2017-04-24 NOTE — Assessment & Plan Note (Signed)
Stable on Bystolic. Denies palpitations, chest pain, fatigue. Continue same.

## 2017-04-24 NOTE — Assessment & Plan Note (Signed)
Stable on Dulera daily. Using albuterol appropriately. Continue same regimen.

## 2017-05-22 ENCOUNTER — Other Ambulatory Visit: Payer: Self-pay | Admitting: Family Medicine

## 2017-05-22 DIAGNOSIS — E785 Hyperlipidemia, unspecified: Secondary | ICD-10-CM

## 2017-05-22 NOTE — Telephone Encounter (Signed)
Refill sent to pharmacy.   

## 2017-05-24 ENCOUNTER — Other Ambulatory Visit: Payer: Self-pay | Admitting: Family Medicine

## 2017-05-24 DIAGNOSIS — J45909 Unspecified asthma, uncomplicated: Secondary | ICD-10-CM

## 2017-06-22 ENCOUNTER — Other Ambulatory Visit: Payer: Self-pay | Admitting: Primary Care

## 2017-06-22 DIAGNOSIS — J45909 Unspecified asthma, uncomplicated: Secondary | ICD-10-CM

## 2017-06-22 MED ORDER — ALBUTEROL SULFATE HFA 108 (90 BASE) MCG/ACT IN AERS: 2.0000 | INHALATION_SPRAY | Freq: Four times a day (QID) | RESPIRATORY_TRACT | 0 refills | Status: DC | PRN

## 2017-06-22 NOTE — Telephone Encounter (Signed)
Copied from CRM [id number]. Topic: Quick Communication - Rx Refill/Question >> Jun 22, 2017 10:06 AM Darletta MollLander, Lumin L wrote: Medication: PROAIR HFA 108 (90 Base) MCG/ACT inhaler   Has the patient contacted their pharmacy? Yes.    (Agent: If no, request that the patient contact the pharmacy for the refill.)  Preferred Pharmacy (with phone number or street name): [phone] [place] - [address] [facility] [address] [place] [id number] Phone: [phone] Fax: [phone]  Agent: Please be advised that RX refills may take up to 3 business days. We ask that you follow-up with your pharmacy.

## 2017-06-22 NOTE — Telephone Encounter (Signed)
LOV: 04/23/17  K.Clark,NP  [facility] [address]    Medication previously filled by Dr. Dayton MartesAron

## 2017-06-22 NOTE — Telephone Encounter (Signed)
Rx sent to pharmacy   

## 2017-06-23 ENCOUNTER — Other Ambulatory Visit: Payer: BLUE CROSS/BLUE SHIELD

## 2017-06-24 ENCOUNTER — Other Ambulatory Visit: Payer: BLUE CROSS/BLUE SHIELD

## 2017-06-26 ENCOUNTER — Other Ambulatory Visit (INDEPENDENT_AMBULATORY_CARE_PROVIDER_SITE_OTHER): Payer: BLUE CROSS/BLUE SHIELD

## 2017-06-26 DIAGNOSIS — E119 Type 2 diabetes mellitus without complications: Secondary | ICD-10-CM

## 2017-06-26 LAB — HEMOGLOBIN A1C: Hgb A1c MFr Bld: 6.7 % — ABNORMAL HIGH (ref 4.6–6.5)

## 2017-06-29 ENCOUNTER — Encounter: Payer: Self-pay | Admitting: Primary Care

## 2017-06-29 DIAGNOSIS — E119 Type 2 diabetes mellitus without complications: Secondary | ICD-10-CM

## 2017-06-29 MED ORDER — METFORMIN HCL 500 MG PO TABS: 500.0000 mg | ORAL_TABLET | Freq: Two times a day (BID) | ORAL | 2 refills | Status: DC

## 2017-07-17 ENCOUNTER — Other Ambulatory Visit: Payer: Self-pay | Admitting: Primary Care

## 2017-07-17 DIAGNOSIS — J45909 Unspecified asthma, uncomplicated: Secondary | ICD-10-CM

## 2017-08-14 ENCOUNTER — Other Ambulatory Visit: Payer: Self-pay | Admitting: Primary Care

## 2017-08-14 DIAGNOSIS — J45909 Unspecified asthma, uncomplicated: Secondary | ICD-10-CM

## 2017-11-18 ENCOUNTER — Other Ambulatory Visit: Payer: Self-pay | Admitting: Primary Care

## 2017-11-18 DIAGNOSIS — J45909 Unspecified asthma, uncomplicated: Secondary | ICD-10-CM

## 2017-12-04 ENCOUNTER — Ambulatory Visit: Payer: BLUE CROSS/BLUE SHIELD | Admitting: Family Medicine

## 2017-12-04 DIAGNOSIS — R05 Cough: Secondary | ICD-10-CM | POA: Diagnosis not present

## 2017-12-04 DIAGNOSIS — Z0289 Encounter for other administrative examinations: Secondary | ICD-10-CM

## 2017-12-04 DIAGNOSIS — H6982 Other specified disorders of Eustachian tube, left ear: Secondary | ICD-10-CM | POA: Diagnosis not present

## 2017-12-04 DIAGNOSIS — J209 Acute bronchitis, unspecified: Secondary | ICD-10-CM | POA: Diagnosis not present

## 2018-01-11 ENCOUNTER — Other Ambulatory Visit: Payer: Self-pay | Admitting: Family Medicine

## 2018-01-11 DIAGNOSIS — J45909 Unspecified asthma, uncomplicated: Secondary | ICD-10-CM

## 2018-01-11 NOTE — Telephone Encounter (Signed)
Is she using her Dulera inhaler everyday? Also is due for diabetes follow up. I recommend she schedule an appointment for diabetes check.

## 2018-01-11 NOTE — Telephone Encounter (Signed)
KC-Plz see refill req/thx dmf 

## 2018-01-15 MED ORDER — ALBUTEROL SULFATE HFA 108 (90 BASE) MCG/ACT IN AERS: INHALATION_SPRAY | RESPIRATORY_TRACT | 0 refills | Status: DC

## 2018-01-15 NOTE — Telephone Encounter (Signed)
Spoken to patient and she stated that she is using this inhale every day.  She also needs a refill of the ProAir inhaler as well.  Patient will call to schedule the appointment since she is also due for a CPE.

## 2018-01-29 ENCOUNTER — Other Ambulatory Visit: Payer: Self-pay | Admitting: Family Medicine

## 2018-01-29 ENCOUNTER — Other Ambulatory Visit: Payer: Self-pay | Admitting: Primary Care

## 2018-01-29 DIAGNOSIS — R000 Tachycardia, unspecified: Secondary | ICD-10-CM

## 2018-01-29 DIAGNOSIS — E785 Hyperlipidemia, unspecified: Secondary | ICD-10-CM

## 2018-02-01 NOTE — Telephone Encounter (Signed)
Noted, refill sent to pharmacy. 

## 2018-02-03 ENCOUNTER — Telehealth: Payer: Self-pay | Admitting: *Deleted

## 2018-02-03 NOTE — Telephone Encounter (Signed)
Copied from CRM [id number]. Topic: General - Other >> Feb 01, 2018  1:54 PM [name] wrote: Reason for CRM: Patient is requesting labs for A1c  repeat labs

## 2018-02-03 NOTE — Telephone Encounter (Signed)
Needs follow up visit. We will do labs during visit. Please schedule.

## 2018-02-03 NOTE — Telephone Encounter (Signed)
[name], patient was suppose to call in September to re-eval. Do you want it be a follow up or physical?

## 2018-02-08 NOTE — Telephone Encounter (Signed)
Lvm asking pt to call back

## 2018-03-11 ENCOUNTER — Other Ambulatory Visit: Payer: Self-pay | Admitting: Internal Medicine

## 2018-03-11 DIAGNOSIS — J45909 Unspecified asthma, uncomplicated: Secondary | ICD-10-CM

## 2018-03-12 ENCOUNTER — Other Ambulatory Visit: Payer: Self-pay | Admitting: Primary Care

## 2018-03-12 DIAGNOSIS — E119 Type 2 diabetes mellitus without complications: Secondary | ICD-10-CM

## 2018-03-16 ENCOUNTER — Other Ambulatory Visit: Payer: Self-pay | Admitting: Primary Care

## 2018-03-16 DIAGNOSIS — E119 Type 2 diabetes mellitus without complications: Secondary | ICD-10-CM

## 2018-03-16 DIAGNOSIS — E785 Hyperlipidemia, unspecified: Secondary | ICD-10-CM

## 2018-03-16 DIAGNOSIS — R7989 Other specified abnormal findings of blood chemistry: Secondary | ICD-10-CM

## 2018-03-16 NOTE — Telephone Encounter (Signed)
Patient is overdue for an office visit and will need to be seen for further refills of any of her medications.  [facility] 1 [facility].

## 2018-03-16 NOTE — Telephone Encounter (Signed)
[facility]  Last office visit on 04/23/2017

## 2018-03-17 ENCOUNTER — Other Ambulatory Visit: Payer: Self-pay

## 2018-03-17 DIAGNOSIS — Z3041 Encounter for surveillance of contraceptive pills: Secondary | ICD-10-CM

## 2018-03-17 MED ORDER — DROSPIRENONE-ETHINYL ESTRADIOL 3-0.03 MG PO TABS: 1.0000 | ORAL_TABLET | Freq: Every day | ORAL | 0 refills | Status: DC

## 2018-03-17 NOTE — Telephone Encounter (Signed)
Pt requesting refill on Syeda; pt is almost out of med. Pt cannot find any add'l pills to last until 04/2018. Pt has CPX 03/30/18. [facility] [address] said pt does not have refills available. Not sure if ins will cover 1 mth rx. [facility] # [facility] [address]. Pt voiced understanding and will keep CPX on 03/30/18.

## 2018-03-23 ENCOUNTER — Other Ambulatory Visit: Payer: BLUE CROSS/BLUE SHIELD

## 2018-03-25 ENCOUNTER — Other Ambulatory Visit (INDEPENDENT_AMBULATORY_CARE_PROVIDER_SITE_OTHER): Payer: BLUE CROSS/BLUE SHIELD

## 2018-03-25 DIAGNOSIS — E785 Hyperlipidemia, unspecified: Secondary | ICD-10-CM

## 2018-03-25 DIAGNOSIS — R7989 Other specified abnormal findings of blood chemistry: Secondary | ICD-10-CM

## 2018-03-25 DIAGNOSIS — E119 Type 2 diabetes mellitus without complications: Secondary | ICD-10-CM

## 2018-03-25 LAB — LDL CHOLESTEROL, DIRECT: Direct LDL: 68 mg/dL

## 2018-03-25 LAB — COMPREHENSIVE METABOLIC PANEL
ALBUMIN: 3.7 g/dL (ref 3.5–5.2)
ALK PHOS: 79 U/L (ref 39–117)
ALT: 19 U/L (ref 0–35)
AST: 20 U/L (ref 0–37)
BUN: 9 mg/dL (ref 6–23)
CO2: 27 mEq/L (ref 19–32)
Calcium: 9 mg/dL (ref 8.4–10.5)
Chloride: 103 mEq/L (ref 96–112)
Creatinine, Ser: 0.64 mg/dL (ref 0.40–1.20)
GFR: 107.74 mL/min (ref >60.00)
GLUCOSE: 131 mg/dL — AB (ref 70–99)
Potassium: 4.2 mEq/L (ref 3.5–5.1)
Sodium: 138 mEq/L (ref 135–145)
Total Bilirubin: 0.4 mg/dL (ref 0.2–1.2)
Total Protein: 7.2 g/dL (ref 6.0–8.3)

## 2018-03-25 LAB — LIPID PANEL
CHOL/HDL RATIO: 3
Cholesterol: 142 mg/dL (ref 0–200)
HDL: 46 mg/dL (ref >39.00)
NonHDL: 95.57
Triglycerides: 298 mg/dL — ABNORMAL HIGH (ref 0.0–149.0)
VLDL: 59.6 mg/dL — ABNORMAL HIGH (ref 0.0–40.0)

## 2018-03-25 LAB — HEMOGLOBIN A1C: HEMOGLOBIN A1C: 6.3 % (ref 4.6–6.5)

## 2018-03-25 LAB — MICROALBUMIN / CREATININE URINE RATIO
Creatinine,U: 223.9 mg/dL
MICROALB UR: 1.8 mg/dL (ref 0.0–1.9)
MICROALB/CREAT RATIO: 0.8 mg/g (ref 0.0–30.0)

## 2018-03-25 LAB — TSH: TSH: 2.86 u[IU]/mL (ref 0.35–4.50)

## 2018-03-26 ENCOUNTER — Other Ambulatory Visit: Payer: Self-pay | Admitting: *Deleted

## 2018-03-30 ENCOUNTER — Encounter

## 2018-03-30 ENCOUNTER — Ambulatory Visit (INDEPENDENT_AMBULATORY_CARE_PROVIDER_SITE_OTHER): Payer: BLUE CROSS/BLUE SHIELD | Admitting: Primary Care

## 2018-03-30 ENCOUNTER — Encounter: Payer: Self-pay | Admitting: Primary Care

## 2018-03-30 VITALS — BP 122/76 | HR 67 | Temp 98.5°F | Ht 62.25 in | Wt 281.0 lb

## 2018-03-30 DIAGNOSIS — Z0000 Encounter for general adult medical examination without abnormal findings: Secondary | ICD-10-CM | POA: Diagnosis not present

## 2018-03-30 DIAGNOSIS — E785 Hyperlipidemia, unspecified: Secondary | ICD-10-CM

## 2018-03-30 DIAGNOSIS — Z23 Encounter for immunization: Secondary | ICD-10-CM

## 2018-03-30 DIAGNOSIS — R000 Tachycardia, unspecified: Secondary | ICD-10-CM

## 2018-03-30 DIAGNOSIS — E669 Obesity, unspecified: Secondary | ICD-10-CM

## 2018-03-30 DIAGNOSIS — G4700 Insomnia, unspecified: Secondary | ICD-10-CM

## 2018-03-30 DIAGNOSIS — R7989 Other specified abnormal findings of blood chemistry: Secondary | ICD-10-CM

## 2018-03-30 DIAGNOSIS — Z8719 Personal history of other diseases of the digestive system: Secondary | ICD-10-CM

## 2018-03-30 DIAGNOSIS — E119 Type 2 diabetes mellitus without complications: Secondary | ICD-10-CM

## 2018-03-30 DIAGNOSIS — J45909 Unspecified asthma, uncomplicated: Secondary | ICD-10-CM

## 2018-03-30 MED ORDER — ALBUTEROL SULFATE HFA 108 (90 BASE) MCG/ACT IN AERS: INHALATION_SPRAY | RESPIRATORY_TRACT | 0 refills | Status: DC

## 2018-03-30 MED ORDER — NEBIVOLOL HCL 10 MG PO TABS: 10.0000 mg | ORAL_TABLET | Freq: Every day | ORAL | 3 refills | Status: DC

## 2018-03-30 MED ORDER — ATORVASTATIN CALCIUM 20 MG PO TABS: 20.0000 mg | ORAL_TABLET | Freq: Every day | ORAL | 3 refills | Status: DC

## 2018-03-30 NOTE — Assessment & Plan Note (Addendum)
Overall doing well on Bystolic daily, does have some tachycardia during the evening hours. Will have her try taking Bystolic in mid morning to see if this covers her through the evening. She will update.

## 2018-03-30 NOTE — Assessment & Plan Note (Signed)
Compliant to atorvastatin daily. LDL of 68 which is at goal. Add in Fish Oil 1000 mg daily with meals for triglycerides. Long discussion regarding importance of regular exercise and healthy diet.

## 2018-03-30 NOTE — Assessment & Plan Note (Addendum)
Compliant to Metformin 500 mg BID, continue same. Recent A1C of 6.3. Urine microalbulmin negative.  Foot exam today. Managed on statin. Pneumonia vaccination provided today. She will schedule an eye exam.  Follow up in 6 months for diabetes check.

## 2018-03-30 NOTE — Progress Notes (Signed)
Subjective:    Patient ID: [name], female    DOB: [birth year], 42 y.o.   MRN: [id number]  HPI  [name] is a 42 year old female who presents today for complete physical.  Immunizations: -Tetanus: Completed in 2010 per patient.  -Influenza: Completed this season -Pneumonia: Completed over 5 years ago.    Diet: She endorses a fair diet. She plans on joining weight watchers. Breakfast: Eggs, yogurt Lunch: Left overs Dinner: Meat, vegetables, starch; restaurants  Snacks: Chocolate, chips Desserts: Daily Beverages: Diet soda, diet tea, HINT water  Exercise: She is not exercising Eye exam:  Completed in 2018.  Dental exam: No recent exam Colonoscopy: Declines. Never completed. Opts for Cologuard Pap Smear: Completed in 2016 Mammogram: Never completed. Declines.   BP Readings from Last 3 Encounters:  03/30/18 122/76  04/23/17 122/78  01/06/17 110/78     Review of Systems  Constitutional: Negative for unexpected weight change.  HENT: Negative for rhinorrhea.   Respiratory: Negative for cough and shortness of breath.   Cardiovascular: Negative for chest pain.       Intermittent palpitations   Gastrointestinal: Negative for constipation and diarrhea.  Genitourinary: Negative for difficulty urinating.  Musculoskeletal: Negative for arthralgias and myalgias.  Skin: Negative for rash.  Allergic/Immunologic: Negative for environmental allergies.  Neurological: Negative for dizziness, numbness and headaches.  Psychiatric/Behavioral: The patient is not nervous/anxious.        Intermittent insomnia, using Melatonin with improvement.        Past Medical History:  Diagnosis Date  . Acne   . Allergic rhinitis   . Anxiety   . Asthma   . Blood in stool   . Chronic bronchitis   . Dyspnea    At times  . Dysrhythmia    Tachycardia on bystolic for  . GERD (gastroesophageal reflux disease)   . HLD (hyperlipidemia)   . Pneumonia    hx of  . Pre-diabetes    On  metformin preventively  . Seasonal allergies      Social History   Socioeconomic History  . Marital status: Married    Spouse name: Not on file  . Number of children: Not on file  . Years of education: Not on file  . Highest education level: Not on file  Occupational History  . Not on file  Social Needs  . Financial resource strain: Not on file  . Food insecurity:    Worry: Not on file    Inability: Not on file  . Transportation needs:    Medical: Not on file    Non-medical: Not on file  Tobacco Use  . Smoking status: Never Smoker  . Smokeless tobacco: Never Used  Substance and Sexual Activity  . Alcohol use: No    Alcohol/week: 0.0 standard drinks  . Drug use: No  . Sexual activity: Yes    Birth control/protection: Pill  Lifestyle  . Physical activity:    Days per week: Not on file    Minutes per session: Not on file  . Stress: Not on file  Relationships  . Social connections:    Talks on phone: Not on file    Gets together: Not on file    Attends religious service: Not on file    Active member of club or organization: Not on file    Attends meetings of clubs or organizations: Not on file    Relationship status: Not on file  . Intimate partner violence:    Fear of current  or ex partner: Not on file    Emotionally abused: Not on file    Physically abused: Not on file    Forced sexual activity: Not on file  Other Topics Concern  . Not on file  Social History Narrative   No regular exercise    Past Surgical History:  Procedure Laterality Date  . ANAL FISSURE REPAIR N/A 05/23/2016   Procedure: Lateral Internal Sphincterotomy and Excision of Internal Tag;  Surgeon: Avel Peace, MD;  Location: [facility];  Service: General;  Laterality: N/A;  . CHOLECYSTECTOMY    . CYST REMOVAL TRUNK     Bottom of spine  . HEMORROIDECTOMY    . TONSILLECTOMY      Family History  Problem Relation Age of Onset  . Heart attack Father   . Arthritis Other        Family hx  .  Diabetes Other        1st degree relative  . Hypertension Other        Family hx  . Stroke Other        1st degree relative < 50  . Other Unknown        Cardiovascular disorder--Family hx    Allergies  Allergen Reactions  . Levalbuterol Shortness Of Breath  . Buspar [Buspirone Hcl]      Fainting feelings   . Sulfamethoxazole     REACTION: unspecified  . Sulfonamide Derivatives     REACTION: rash    Current Outpatient Medications on File Prior to Visit  Medication Sig Dispense Refill  . albuterol (PROVENTIL) (2.5 MG/3ML) 0.083% nebulizer solution INHALE CONTENTS OF 1 VIAL VIA NEBULIZER EVERY 6 HOURS AS NEEDED FOR WHEEZING 150 mL 2  . cetirizine (ZYRTEC) 10 MG tablet Take 10 mg by mouth daily.      Marland Kitchen docusate sodium (COLACE) 100 MG capsule Take 100 mg by mouth 2 (two) times daily.     . drospirenone-ethinyl estradiol (YASMIN,ZARAH,SYEDA) 3-0.03 MG tablet Take 1 tablet by mouth daily. 3 Package 0  . DULERA 200-5 MCG/ACT AERO INHALE 2 PUFFS BY MOUTH EVERY DAY 13 g 0  . MELATONIN PO Take 6 mg by mouth at bedtime.    . metFORMIN (GLUCOPHAGE) 500 MG tablet TAKE 1 TABLET(500 MG) BY MOUTH TWICE DAILY WITH A MEAL 180 tablet 0  . montelukast (SINGULAIR) 10 MG tablet TAKE 1 TABLET BY MOUTH EVERY NIGHT AT BEDTIME 90 tablet 1  . OVER THE COUNTER MEDICATION Take 1 capsule by mouth at bedtime. Ultra Flora Probiotic     No current facility-administered medications on file prior to visit.     BP 122/76   Pulse 67   Temp 98.5 F (36.9 C) (Oral)   Ht 5' 2.25" (1.581 m)   Wt 281 lb (127.5 kg)   LMP 03/14/2018   SpO2 97%   BMI 50.98 kg/m    Objective:   Physical Exam  Constitutional: She is oriented to person, place, and time. She appears well-nourished.  HENT:  Mouth/Throat: No oropharyngeal exudate.  Eyes: Pupils are equal, round, and reactive to light. EOM are normal.  Neck: Neck supple. No thyromegaly present.  Cardiovascular: Normal rate and regular rhythm.  Respiratory: Effort  normal and breath sounds normal.  GI: Soft. Bowel sounds are normal. There is no abdominal tenderness.  Musculoskeletal: Normal range of motion.  Neurological: She is alert and oriented to person, place, and time.  Skin: Skin is warm and dry.  Psychiatric: She has a normal mood and  affect.           Assessment & Plan:

## 2018-03-30 NOTE — Assessment & Plan Note (Signed)
Recent TSH within normal range.

## 2018-03-30 NOTE — Assessment & Plan Note (Signed)
Overall doing okay on Melatonin, continue same.

## 2018-03-30 NOTE — Assessment & Plan Note (Signed)
Denies rectal bleeding or pain now. Compliant to Colace and Probiotics.

## 2018-03-30 NOTE — Assessment & Plan Note (Signed)
Tetanus UTD, due in Summer 2020. Pneumonia vaccination due, provided today. Pap smear due, she declines and will schedule for another day. Colon cancer screening due, she declines colonoscopy and opts for Cologuard. Signed up today. Declines mammogram despite recommendations.  Discussed the importance of a healthy diet and regular exercise in order for weight loss, and to reduce the risk of any potential medical problems. Exam stable. Labs reviewed. Follow up in 1 year for CPE.

## 2018-03-30 NOTE — Assessment & Plan Note (Addendum)
Doing well on Dulera twice daily, using her albuterol inhaler 1-2 times weekly on average. Compliant to Singulair and Zyrtec. Continue same.

## 2018-03-30 NOTE — Patient Instructions (Signed)
It's important to improve your diet by reducing consumption of fast food, fried food, processed snack foods, sugary drinks. Increase consumption of fresh vegetables and fruits, whole grains, water.  Ensure you are drinking 64 ounces of water daily.  Start exercising. You should be getting 150 minutes of moderate intensity exercise weekly.  You were provided with a pneumonia vaccination today. This will cover you for 5 years.  Use your Dulera inhaler twice daily, everyday.  Try taking the Bystolic around mid morning.  Complete the cologuard kit as instructed.  Start Fish Oil 1000 mg with a meal, do this daily.  Schedule a follow up visit in 6 months for diabetes check.  It was a pleasure to see you today!   Preventive Care 40-64 Years, Female Preventive care refers to lifestyle choices and visits with your health care provider that can promote health and wellness. What does preventive care include?  A yearly physical exam. This is also called an annual well check.  Dental exams once or twice a year.  Routine eye exams. Ask your health care provider how often you should have your eyes checked.  Personal lifestyle choices, including: ? Daily care of your teeth and gums. ? Regular physical activity. ? Eating a healthy diet. ? Avoiding tobacco and drug use. ? Limiting alcohol use. ? Practicing safe sex. ? Taking low-dose aspirin daily starting at age 60. ? Taking vitamin and mineral supplements as recommended by your health care provider. What happens during an annual well check? The services and screenings done by your health care provider during your annual well check will depend on your age, overall health, lifestyle risk factors, and family history of disease. Counseling Your health care provider may ask you questions about your:  Alcohol use.  Tobacco use.  Drug use.  Emotional well-being.  Home and relationship well-being.  Sexual activity.  Eating  habits.  Work and work Statistician.  Method of birth control.  Menstrual cycle.  Pregnancy history.  Screening You may have the following tests or measurements:  Height, weight, and BMI.  Blood pressure.  Lipid and cholesterol levels. These may be checked every 5 years, or more frequently if you are over 26 years old.  Skin check.  Lung cancer screening. You may have this screening every year starting at age 36 if you have a 30-pack-year history of smoking and currently smoke or have quit within the past 15 years.  Fecal occult blood test (FOBT) of the stool. You may have this test every year starting at age 3.  Flexible sigmoidoscopy or colonoscopy. You may have a sigmoidoscopy every 5 years or a colonoscopy every 10 years starting at age 53.  Hepatitis C blood test.  Hepatitis B blood test.  Sexually transmitted disease (STD) testing.  Diabetes screening. This is done by checking your blood sugar (glucose) after you have not eaten for a while (fasting). You may have this done every 1-3 years.  Mammogram. This may be done every 1-2 years. Talk to your health care provider about when you should start having regular mammograms. This may depend on whether you have a family history of breast cancer.  BRCA-related cancer screening. This may be done if you have a family history of breast, ovarian, tubal, or peritoneal cancers.  Pelvic exam and Pap test. This may be done every 3 years starting at age 47. Starting at age 19, this may be done every 5 years if you have a Pap test in combination with an  HPV test.  Bone density scan. This is done to screen for osteoporosis. You may have this scan if you are at high risk for osteoporosis.  Discuss your test results, treatment options, and if necessary, the need for more tests with your health care provider. Vaccines Your health care provider may recommend certain vaccines, such as:  Influenza vaccine. This is recommended every  year.  Tetanus, diphtheria, and acellular pertussis (Tdap, Td) vaccine. You may need a Td booster every 10 years.  Varicella vaccine. You may need this if you have not been vaccinated.  Zoster vaccine. You may need this after age 31.  Measles, mumps, and rubella (MMR) vaccine. You may need at least one dose of MMR if you were born in 1957 or later. You may also need a second dose.  Pneumococcal 13-valent conjugate (PCV13) vaccine. You may need this if you have certain conditions and were not previously vaccinated.  Pneumococcal polysaccharide (PPSV23) vaccine. You may need one or two doses if you smoke cigarettes or if you have certain conditions.  Meningococcal vaccine. You may need this if you have certain conditions.  Hepatitis A vaccine. You may need this if you have certain conditions or if you travel or work in places where you may be exposed to hepatitis A.  Hepatitis B vaccine. You may need this if you have certain conditions or if you travel or work in places where you may be exposed to hepatitis B.  Haemophilus influenzae type b (Hib) vaccine. You may need this if you have certain conditions.  Talk to your health care provider about which screenings and vaccines you need and how often you need them. This information is not intended to replace advice given to you by your health care provider. Make sure you discuss any questions you have with your health care provider. Document Released: 04/27/2015 Document Revised: 12/19/2015 Document Reviewed: 01/30/2015 Elsevier Interactive Patient Education  [employer].

## 2018-03-30 NOTE — Addendum Note (Signed)
Addended by: Tawnya CrookSAMBATH, Jontae Sonier on: 03/30/2018 12:28 PM   Modules accepted: Orders

## 2018-03-30 NOTE — Assessment & Plan Note (Signed)
Discussed the importance of a healthy diet and regular exercise in order for weight loss, and to reduce the risk of any potential medical problems.  

## 2018-05-01 ENCOUNTER — Other Ambulatory Visit: Payer: Self-pay | Admitting: Primary Care

## 2018-05-01 DIAGNOSIS — J45909 Unspecified asthma, uncomplicated: Secondary | ICD-10-CM

## 2018-05-01 DIAGNOSIS — R000 Tachycardia, unspecified: Secondary | ICD-10-CM

## 2018-05-01 DIAGNOSIS — E785 Hyperlipidemia, unspecified: Secondary | ICD-10-CM

## 2018-05-13 ENCOUNTER — Other Ambulatory Visit: Payer: Self-pay | Admitting: Primary Care

## 2018-05-13 DIAGNOSIS — J45909 Unspecified asthma, uncomplicated: Secondary | ICD-10-CM

## 2018-06-06 ENCOUNTER — Other Ambulatory Visit: Payer: Self-pay | Admitting: Primary Care

## 2018-06-06 DIAGNOSIS — Z3041 Encounter for surveillance of contraceptive pills: Secondary | ICD-10-CM

## 2018-06-08 ENCOUNTER — Other Ambulatory Visit: Payer: Self-pay | Admitting: Primary Care

## 2018-06-08 ENCOUNTER — Telehealth: Payer: Self-pay | Admitting: Primary Care

## 2018-06-08 DIAGNOSIS — E119 Type 2 diabetes mellitus without complications: Secondary | ICD-10-CM

## 2018-06-08 NOTE — Telephone Encounter (Signed)
Left message asking pt to call office please r/s 6/3 appointment with [facility]

## 2018-06-29 ENCOUNTER — Ambulatory Visit (INDEPENDENT_AMBULATORY_CARE_PROVIDER_SITE_OTHER): Payer: BLUE CROSS/BLUE SHIELD | Admitting: Primary Care

## 2018-06-29 ENCOUNTER — Encounter: Payer: Self-pay | Admitting: Primary Care

## 2018-06-29 ENCOUNTER — Telehealth: Payer: Self-pay

## 2018-06-29 ENCOUNTER — Other Ambulatory Visit: Payer: Self-pay

## 2018-06-29 VITALS — BP 124/80 | HR 81 | Temp 98.0°F | Ht 62.25 in | Wt 280.8 lb

## 2018-06-29 DIAGNOSIS — J069 Acute upper respiratory infection, unspecified: Secondary | ICD-10-CM

## 2018-06-29 DIAGNOSIS — B9789 Other viral agents as the cause of diseases classified elsewhere: Secondary | ICD-10-CM | POA: Diagnosis not present

## 2018-06-29 MED ORDER — METHYLPREDNISOLONE ACETATE 80 MG/ML IJ SUSP
80.0000 mg | Freq: Once | INTRAMUSCULAR | Status: AC
  Administered 2018-06-29: 80 mg via INTRAMUSCULAR

## 2018-06-29 NOTE — Telephone Encounter (Signed)
pts husband said pt has had cold symptoms for 2 days; pt having prod cough ? Color of phlegm; pt having a lot of trouble breathing on and off; pt has been using neb treatments 4 x a day. pts husband said pt is not in respiratory distress. pts husband said pt does not need to go to [facility] or ED and if she does develop symptoms that warrant ED visit pts husband will take her. pts husband said I want an appt today for pt to be seen. Pt has appt with Allayne Gitelman NP 06/29/18 at 3:40 with ED precautions. pts husband voiced understanding. FYI to Allayne Gitelman NP.

## 2018-06-29 NOTE — Patient Instructions (Signed)
Your symptoms are representative of a viral illness which will resolve on its own over time. Our goal is to treat your symptoms in order to aid your body in the healing process and to make you more comfortable.   Use the albuterol every 4-6 hours as needed.  You can try a few things over the counter to help with your symptoms including:  Cough: Delsym or Robitussin (get the off brand, works just as well) Chest Congestion: Mucinex (plain) Nasal Congestion/Ear Pressure/Sinus Pressure: Try using Flonase (fluticasone) nasal spray. Instill 1 spray in each nostril twice daily. This can be purchased over the counter. Body aches, fevers, headache: Ibuprofen (not to exceed 2400 mg in 24 hours) or Acetaminophen-Tylenol (not to exceed 3000 mg in 24 hours) Runny Nose/Throat Drainage/Sneezing/Itchy or Watery Eyes: An antihistamine such as Zyrtec, Claritin, Xyzal, Allegra  Please notify me if you develop persistent fevers of 101, develop shortness of breath or increased shortness of breath, and/or feel worse after 1 week of onset of symptoms.   Increase consumption of water intake and rest.  It was a pleasure to see you today!

## 2018-06-29 NOTE — Progress Notes (Signed)
Subjective:    Patient ID: [name], female    DOB: [birth year], 43 y.o.   MRN: [id number]  HPI  [name] is a 43 year old female with a history of asthma, allergic rhinitis, GERD, type 2 diabetes, who presents today with a chief complaint of cough.  She also reports low grade fever, shortness of breath, post nasal drip, feels hot. Her last fever was three days ago. Her symptoms began four days ago with diarrhea that evening which resolved two days later. She had been increasing her veggie intake at that point.   She's been using her nebulized albuterol treatments with improvement. She's not using her Dulera consistently. She is compliant to her Zyrtec and Singulair. She denies recent travel, Covid-19 exposure. She is a stay at home mom. Today she's feeling better.   Review of Systems  Constitutional: Positive for fatigue. Negative for fever.  HENT: Positive for congestion and postnasal drip. Negative for sinus pressure and sore throat.   Respiratory: Positive for cough, shortness of breath and wheezing.        Past Medical History:  Diagnosis Date  . Acne   . Allergic rhinitis   . Anxiety   . Asthma   . Blood in stool   . Chronic bronchitis   . Dyspnea    At times  . Dysrhythmia    Tachycardia on bystolic for  . GERD (gastroesophageal reflux disease)   . HLD (hyperlipidemia)   . Pneumonia    hx of  . Pre-diabetes    On metformin preventively  . Seasonal allergies      Social History   Socioeconomic History  . Marital status: Married    Spouse name: Not on file  . Number of children: Not on file  . Years of education: Not on file  . Highest education level: Not on file  Occupational History  . Not on file  Social Needs  . Financial resource strain: Not on file  . Food insecurity:    Worry: Not on file    Inability: Not on file  . Transportation needs:    Medical: Not on file    Non-medical: Not on file  Tobacco Use  . Smoking status: Never Smoker  .  Smokeless tobacco: Never Used  Substance and Sexual Activity  . Alcohol use: No    Alcohol/week: 0.0 standard drinks  . Drug use: No  . Sexual activity: Yes    Birth control/protection: Pill  Lifestyle  . Physical activity:    Days per week: Not on file    Minutes per session: Not on file  . Stress: Not on file  Relationships  . Social connections:    Talks on phone: Not on file    Gets together: Not on file    Attends religious service: Not on file    Active member of club or organization: Not on file    Attends meetings of clubs or organizations: Not on file    Relationship status: Not on file  . Intimate partner violence:    Fear of current or ex partner: Not on file    Emotionally abused: Not on file    Physically abused: Not on file    Forced sexual activity: Not on file  Other Topics Concern  . Not on file  Social History Narrative   No regular exercise    Past Surgical History:  Procedure Laterality Date  . ANAL FISSURE REPAIR N/A 05/23/2016   Procedure: Lateral Internal  Sphincterotomy and Excision of Internal Tag;  Surgeon: Avel Peaceodd Rosenbower, MD;  Location: [facility];  Service: General;  Laterality: N/A;  . CHOLECYSTECTOMY    . CYST REMOVAL TRUNK     Bottom of spine  . HEMORROIDECTOMY    . TONSILLECTOMY      Family History  Problem Relation Age of Onset  . Heart attack Father   . Arthritis Other        Family hx  . Diabetes Other        1st degree relative  . Hypertension Other        Family hx  . Stroke Other        1st degree relative < 50  . Other Unknown        Cardiovascular disorder--Family hx    Allergies  Allergen Reactions  . Levalbuterol Shortness Of Breath  . Buspar [Buspirone Hcl]      Fainting feelings   . Sulfamethoxazole     REACTION: unspecified  . Sulfonamide Derivatives     REACTION: rash    Current Outpatient Medications on File Prior to Visit  Medication Sig Dispense Refill  . albuterol (PROAIR HFA) 108 (90 Base) MCG/ACT  inhaler INHALE 2 PUFFS INTO THE LUNGS EVERY 6 HOURS AS NEEDED FOR WHEEZING OR SHORTNESS OF BREATH 18 g 0  . albuterol (PROVENTIL) (2.5 MG/3ML) 0.083% nebulizer solution INHALE CONTENTS OF 1 VIAL VIA NEBULIZER EVERY 6 HOURS AS NEEDED FOR WHEEZING 150 mL 2  . atorvastatin (LIPITOR) 20 MG tablet Take 1 tablet (20 mg total) by mouth daily. For cholesterol. 90 tablet 3  . cetirizine (ZYRTEC) 10 MG tablet Take 10 mg by mouth daily.      Marland Kitchen. docusate sodium (COLACE) 100 MG capsule Take 100 mg by mouth 2 (two) times daily.     . DULERA 200-5 MCG/ACT AERO INHALE 2 PUFFS BY MOUTH EVERY DAY 13 g 0  . MELATONIN PO Take 6 mg by mouth at bedtime.    . metFORMIN (GLUCOPHAGE) 500 MG tablet TAKE 1 TABLET(500 MG) BY MOUTH TWICE DAILY WITH A MEAL 180 tablet 1  . montelukast (SINGULAIR) 10 MG tablet TAKE 1 TABLET BY MOUTH EVERY NIGHT AT BEDTIME 90 tablet 1  . nebivolol (BYSTOLIC) 10 MG tablet Take 1 tablet (10 mg total) by mouth daily. 90 tablet 3  . OVER THE COUNTER MEDICATION Take 1 capsule by mouth at bedtime. Ultra Flora Probiotic    . SYEDA 3-0.03 MG tablet TAKE 1 TABLET BY MOUTH DAILY 84 tablet 2   No current facility-administered medications on file prior to visit.     BP 124/80   Pulse 81   Temp 98 F (36.7 C) (Oral)   Ht 5' 2.25" (1.581 m)   Wt 280 lb 12 oz (127.3 kg)   LMP 06/13/2018   SpO2 98%   BMI 50.94 kg/m    Objective:   Physical Exam  Constitutional: She appears well-nourished. She does not appear ill.  HENT:  Right Ear: Tympanic membrane and ear canal normal.  Left Ear: Tympanic membrane and ear canal normal.  Nose: No mucosal edema. Right sinus exhibits no maxillary sinus tenderness and no frontal sinus tenderness. Left sinus exhibits no maxillary sinus tenderness and no frontal sinus tenderness.  Mouth/Throat: Oropharynx is clear and moist.  Neck: Neck supple.  Cardiovascular: Normal rate and regular rhythm.  Respiratory: Effort normal and breath sounds normal. She has no wheezes.   Skin: Skin is warm and dry.  Assessment & Plan:  Viral URI:  Cough, low grade fever, shortness of breath wheezing x four days. Overall feeling better today, no fevers for three days.  Exam today stable, no wheezing. Given continued shortness of breath we will treat for acute asthma exacerbation likely exacerbated by viral and/or allergy cause. IM Depo Medrol 80 mg provided today.  Discussed use of Dulera daily as prescribed, albuterol PRN. She appears well and does not exhibit signs for Covid-19. Return precautions provided.  Doreene Nest, NP

## 2018-06-29 NOTE — Telephone Encounter (Signed)
Noted, will evaluate. 

## 2018-06-29 NOTE — Addendum Note (Signed)
Addended by: Tawnya Crook on: 06/29/2018 04:35 PM   Modules accepted: Orders

## 2018-07-17 ENCOUNTER — Other Ambulatory Visit: Payer: Self-pay | Admitting: Primary Care

## 2018-07-17 DIAGNOSIS — E119 Type 2 diabetes mellitus without complications: Secondary | ICD-10-CM

## 2018-07-26 ENCOUNTER — Other Ambulatory Visit (INDEPENDENT_AMBULATORY_CARE_PROVIDER_SITE_OTHER): Payer: Self-pay | Admitting: Surgery

## 2018-07-27 NOTE — Telephone Encounter (Signed)
albuterol (PROVENTIL) (2.5 MG/3ML) 0.083% nebulizer solution

## 2018-08-03 ENCOUNTER — Other Ambulatory Visit: Payer: Self-pay | Admitting: Primary Care

## 2018-08-03 DIAGNOSIS — J45909 Unspecified asthma, uncomplicated: Secondary | ICD-10-CM

## 2018-08-24 ENCOUNTER — Other Ambulatory Visit: Payer: BLUE CROSS/BLUE SHIELD

## 2018-08-24 ENCOUNTER — Encounter: Payer: Self-pay | Admitting: Primary Care

## 2018-08-24 ENCOUNTER — Ambulatory Visit (INDEPENDENT_AMBULATORY_CARE_PROVIDER_SITE_OTHER): Payer: BLUE CROSS/BLUE SHIELD | Admitting: Primary Care

## 2018-08-24 VITALS — Temp 98.0°F | Wt 281.0 lb

## 2018-08-24 DIAGNOSIS — R350 Frequency of micturition: Secondary | ICD-10-CM

## 2018-08-24 LAB — POC URINALSYSI DIPSTICK (AUTOMATED)
Bilirubin, UA: NEGATIVE
Blood, UA: NEGATIVE
Glucose, UA: NEGATIVE
Ketones, UA: NEGATIVE
Leukocytes, UA: NEGATIVE
Nitrite, UA: NEGATIVE
Protein, UA: NEGATIVE
Spec Grav, UA: 1.015 (ref 1.010–1.025)
Urobilinogen, UA: 0.2 E.U./dL
pH, UA: 6 (ref 5.0–8.0)

## 2018-08-24 NOTE — Patient Instructions (Signed)
We will be in touch later this week once we receive your urine culture results.  Ensure you are consuming 64 ounces of water daily.  Limit desserts and sugary drinks, especially before bedtime.   We will see you in early June as discussed.  It was a pleasure to see you today! Mayra Reel, NP-C

## 2018-08-24 NOTE — Progress Notes (Signed)
Subjective:    Patient ID: [name], female    DOB: 10/25/1975, 43 y.o.   MRN: [id number]  HPI  Virtual Visit via Video Note  I connected with [name] on 08/24/18 at 12:00 PM EDT by a video enabled telemedicine application and verified that I am speaking with the correct person using two identifiers.  Location: Patient: Home Provider: Office   I discussed the limitations of evaluation and management by telemedicine and the availability of in person appointments. The patient expressed understanding and agreed to proceed.  History of Present Illness:  Ms. Atamian is a 43 year old female with a history of type 2 diabetes who presents today with a chief complaint of urinary frequency.  She also reports foul smelling urine, suprapubic pressure, cloudy urine, mild vaginal discharge. She's taken AZO twice daily for the last week with some improvement. Her symptoms began two weeks ago.   She denies hematuria, vaginal itching. She doesn't drink much water, mostly drinks diet/caffeine soda, Powerade Zero. She has been making homemade milk shakes most every evening before bed. She did have her husband check her blood sugar four weeks ago, doesn't remember the reading but her husband told her that it was "normal".    Observations/Objective:  Alert and oriented. Appears well, not sickly. No distress. Speaking in complete sentences.   Assessment and Plan:  Symptoms suggestive of UTI. Could be bacterial vaginosis or yeast involvement. Also consider return of her diabetes.  UA today is negative. Culture sent.   Recommended she reduce intake of milk shakes, especially before bed as this could be contributing to her urinary frequency at night.  Also encouraged her to check her glucose levels before any meal or 2 hours after a meal and to report readings that are above 120. She is scheduled for a diabetes follow up in a few weeks. Increase water consumption, limit diet sodas.  If  urine culture negative then need to consider vaginal work and/or diabetes evaluation. Await results.   Follow Up Instructions:  We will be in touch later this week once we receive your urine culture results.  Ensure you are consuming 64 ounces of water daily.  Limit desserts and sugary drinks, especially before bedtime.   We will see you in early June as discussed.  It was a pleasure to see you today! Mayra Reel, NP-C    I discussed the assessment and treatment plan with the patient. The patient was provided an opportunity to ask questions and all were answered. The patient agreed with the plan and demonstrated an understanding of the instructions.   The patient was advised to call back or seek an in-person evaluation if the symptoms worsen or if the condition fails to improve as anticipated.     Doreene Nest, NP    Review of Systems  Constitutional: Negative for fever.  Gastrointestinal: Negative for abdominal pain and nausea.  Endocrine: Positive for polyuria. Negative for polydipsia.  Genitourinary: Positive for frequency and vaginal discharge. Negative for dysuria, flank pain, hematuria and pelvic pain.       Past Medical History:  Diagnosis Date  . Acne   . Allergic rhinitis   . Anxiety   . Asthma   . Blood in stool   . Chronic bronchitis   . Dyspnea    At times  . Dysrhythmia    Tachycardia on bystolic for  . GERD (gastroesophageal reflux disease)   . HLD (hyperlipidemia)   . Pneumonia  hx of  . Pre-diabetes    On metformin preventively  . Seasonal allergies      Social History   Socioeconomic History  . Marital status: Married    Spouse name: Not on file  . Number of children: Not on file  . Years of education: Not on file  . Highest education level: Not on file  Occupational History  . Not on file  Social Needs  . Financial resource strain: Not on file  . Food insecurity:    Worry: Not on file    Inability: Not on file  .  Transportation needs:    Medical: Not on file    Non-medical: Not on file  Tobacco Use  . Smoking status: Never Smoker  . Smokeless tobacco: Never Used  Substance and Sexual Activity  . Alcohol use: No    Alcohol/week: 0.0 standard drinks  . Drug use: No  . Sexual activity: Yes    Birth control/protection: Pill  Lifestyle  . Physical activity:    Days per week: Not on file    Minutes per session: Not on file  . Stress: Not on file  Relationships  . Social connections:    Talks on phone: Not on file    Gets together: Not on file    Attends religious service: Not on file    Active member of club or organization: Not on file    Attends meetings of clubs or organizations: Not on file    Relationship status: Not on file  . Intimate partner violence:    Fear of current or ex partner: Not on file    Emotionally abused: Not on file    Physically abused: Not on file    Forced sexual activity: Not on file  Other Topics Concern  . Not on file  Social History Narrative   No regular exercise    Past Surgical History:  Procedure Laterality Date  . ANAL FISSURE REPAIR N/A 05/23/2016   Procedure: Lateral Internal Sphincterotomy and Excision of Internal Tag;  Surgeon: Avel Peace, MD;  Location: [facility];  Service: General;  Laterality: N/A;  . CHOLECYSTECTOMY    . CYST REMOVAL TRUNK     Bottom of spine  . HEMORROIDECTOMY    . TONSILLECTOMY      Family History  Problem Relation Age of Onset  . Heart attack Father   . Arthritis Other        Family hx  . Diabetes Other        1st degree relative  . Hypertension Other        Family hx  . Stroke Other        1st degree relative < 50  . Other Unknown        Cardiovascular disorder--Family hx    Allergies  Allergen Reactions  . Levalbuterol Shortness Of Breath  . Buspar [Buspirone Hcl]      Fainting feelings   . Sulfamethoxazole     REACTION: unspecified  . Sulfonamide Derivatives     REACTION: rash    Current  Outpatient Medications on File Prior to Visit  Medication Sig Dispense Refill  . albuterol (PROAIR HFA) 108 (90 Base) MCG/ACT inhaler INHALE 2 PUFFS INTO THE LUNGS EVERY 6 HOURS AS NEEDED FOR WHEEZING OR SHORTNESS OF BREATH 18 g 0  . albuterol (PROVENTIL) (2.5 MG/3ML) 0.083% nebulizer solution INHALE CONTENTS OF 1 VIAL VIA NEBULIZER EVERY 6 HOURS AS NEEDED FOR WHEEZING 150 mL 2  . atorvastatin (LIPITOR)  20 MG tablet Take 1 tablet (20 mg total) by mouth daily. For cholesterol. 90 tablet 3  . cetirizine (ZYRTEC) 10 MG tablet Take 10 mg by mouth daily.      Marland Kitchen. docusate sodium (COLACE) 100 MG capsule Take 100 mg by mouth 2 (two) times daily.     . DULERA 200-5 MCG/ACT AERO INHALE 2 PUFFS BY MOUTH EVERY DAY 13 g 0  . MELATONIN PO Take 6 mg by mouth at bedtime.    . metFORMIN (GLUCOPHAGE) 500 MG tablet TAKE 1 TABLET(500 MG) BY MOUTH TWICE DAILY WITH A MEAL 180 tablet 1  . montelukast (SINGULAIR) 10 MG tablet TAKE 1 TABLET BY MOUTH EVERY NIGHT AT BEDTIME 90 tablet 1  . nebivolol (BYSTOLIC) 10 MG tablet Take 1 tablet (10 mg total) by mouth daily. 90 tablet 3  . OVER THE COUNTER MEDICATION Take 1 capsule by mouth at bedtime. Ultra Flora Probiotic    . SYEDA 3-0.03 MG tablet TAKE 1 TABLET BY MOUTH DAILY 84 tablet 2   No current facility-administered medications on file prior to visit.     Temp 98 F (36.7 C) (Oral)   Wt 281 lb (127.5 kg)   BMI 50.98 kg/m    Objective:   Physical Exam  Constitutional: She is oriented to person, place, and time. She appears well-nourished.  Respiratory: Effort normal.  Neurological: She is alert and oriented to person, place, and time.  Psychiatric: She has a normal mood and affect.           Assessment & Plan:

## 2018-08-26 LAB — URINE CULTURE
MICRO NUMBER:: 466515
SPECIMEN QUALITY:: ADEQUATE

## 2018-09-14 ENCOUNTER — Telehealth: Payer: Self-pay | Admitting: Primary Care

## 2018-09-14 NOTE — Telephone Encounter (Signed)
Called to discuss 6/3 appt with patient. Need to see if we can make it virtual and r/s pap

## 2018-09-15 ENCOUNTER — Ambulatory Visit: Payer: BLUE CROSS/BLUE SHIELD | Admitting: Primary Care

## 2018-10-16 ENCOUNTER — Other Ambulatory Visit: Payer: Self-pay | Admitting: Primary Care

## 2018-10-16 DIAGNOSIS — J45909 Unspecified asthma, uncomplicated: Secondary | ICD-10-CM

## 2018-10-21 ENCOUNTER — Telehealth: Payer: Self-pay

## 2018-10-21 DIAGNOSIS — J45909 Unspecified asthma, uncomplicated: Secondary | ICD-10-CM

## 2018-10-21 MED ORDER — ALBUTEROL SULFATE (2.5 MG/3ML) 0.083% IN NEBU: INHALATION_SOLUTION | RESPIRATORY_TRACT | 0 refills | Status: DC

## 2018-10-21 NOTE — Telephone Encounter (Signed)
Noted, refill sent to pharmacy. 

## 2018-10-21 NOTE — Telephone Encounter (Signed)
Pt left a VM on triage line. She is needing a refill of her albuterol nebulizer solution. We have never filled it. She asked that we send it to [facility] [address] [facility] [employer]

## 2018-11-25 ENCOUNTER — Telehealth: Payer: Self-pay

## 2018-11-25 NOTE — Telephone Encounter (Signed)
[facility] Night - Client TELEPHONE ADVICE RECORD [facility] Patient Name: [name] Gender: Female DOB: 1975-09-29 Age: 43 Y 25 M 26 D Return Phone Number: [phone] (Primary), [phone] (Secondary) [place] [address] Client [place] Night - Client Client Site [place] - Night Physician Alma Friendly - NP Contact Type Call Who Is Calling Patient / Member / Family / Caregiver Call Type Triage / Clinical Relationship To Patient Self Return Phone Number [phone] (Primary) Chief Complaint Headache Reason for Call Request to Schedule Office Appointment Initial Comment Caller states she would like information about a covid test. She has a headache, diarrhea, and sore throat. She has urinated within the last 8 hours. Translation No Nurse Assessment Nurse: Joline Salt, RN, Malachy Mood Date/Time Eilene Ghazi Time): 11/24/2018 5:11:19 PM Confirm and document reason for call. If symptomatic, describe symptoms. ---Caller states she would like information about a Covid test. She has a headache, diarrhea, and sore throat. No fever. Has the patient had close contact with a person known or suspected to have the novel coronavirus illness OR traveled / lives in area with major community spread (including international travel) in the last 14 days from the onset of symptoms? * If Asymptomatic, screen for exposure and travel within the last 14 days. ---No Does the patient have any new or worsening symptoms? ---Yes Will a triage be completed? ---Yes Related visit to physician within the last 2 weeks? ---No Does the PT have any chronic conditions? (i.e. diabetes, asthma, this includes High risk factors for pregnancy, etc.) ---Yes List chronic conditions. ---diabetes and asthma Is the patient pregnant or possibly pregnant? (Ask all females between the ages of 47-55) ---No Is this a  behavioral health or substance abuse call? ---No Guidelines Guideline Title Affirmed Question Affirmed Notes Nurse Date/Time (Eastern Time) Coronavirus (COVID-19) - Diagnosed or Suspected [1] COVID-19 infection suspected by caller or triager AND [2] mild symptoms (cough, fever, Catha Brow 11/24/2018 5:13:36 PM

## 2018-11-25 NOTE — Telephone Encounter (Signed)
Please provide her with information for our testing sites through [facility]. She may go at any time.

## 2018-11-26 ENCOUNTER — Other Ambulatory Visit: Payer: Self-pay

## 2018-11-26 DIAGNOSIS — Z20822 Contact with and (suspected) exposure to covid-19: Secondary | ICD-10-CM

## 2018-11-26 NOTE — Telephone Encounter (Signed)
Spoken to patient this morning and she stated that no need for test for now. She feels much better today and decline on going to get tested.

## 2018-11-28 LAB — NOVEL CORONAVIRUS, NAA: SARS-CoV-2, NAA: NOT DETECTED

## 2019-01-01 ENCOUNTER — Other Ambulatory Visit: Payer: Self-pay | Admitting: Primary Care

## 2019-01-01 DIAGNOSIS — E119 Type 2 diabetes mellitus without complications: Secondary | ICD-10-CM

## 2019-01-01 DIAGNOSIS — R000 Tachycardia, unspecified: Secondary | ICD-10-CM

## 2019-01-03 MED ORDER — NEBIVOLOL HCL 10 MG PO TABS: 10.0000 mg | ORAL_TABLET | Freq: Every day | ORAL | 1 refills | Status: AC

## 2019-01-03 NOTE — Telephone Encounter (Signed)
Spoken to patient and got disconnected. However, I did refill both Rx as requested.

## 2019-01-03 NOTE — Telephone Encounter (Signed)
Pt left v/m that since 12/29/18 pt pharmacy has requested refill bystolic; pt took last bystolic today and also request refill for metformin. Pt request cb.

## 2019-01-13 ENCOUNTER — Telehealth: Payer: Self-pay | Admitting: Primary Care

## 2019-01-13 NOTE — Telephone Encounter (Signed)
Please notify patient that we can get labs during her visit, but if she prefers to come 1-2 days before her appointment for labs that will work as well.

## 2019-01-13 NOTE — Telephone Encounter (Signed)
Patient stated she was over due for a diabetic follow up so she schedule 10/15 with you. She was wondering if you will need blood work done before this appointment since it has been awahile since you seen her last

## 2019-01-14 NOTE — Telephone Encounter (Signed)
Spoken and notified patient of Kate Clark's comments. Patient verbalized understanding.  

## 2019-01-22 ENCOUNTER — Ambulatory Visit (HOSPITAL_COMMUNITY)
Admission: EM | Admit: 2019-01-22 | Discharge: 2019-01-22 | Disposition: A | Discharge status: Home / Self Care | Payer: Managed Care, Other (non HMO)

## 2019-01-22 ENCOUNTER — Other Ambulatory Visit: Payer: Self-pay

## 2019-01-22 ENCOUNTER — Encounter (HOSPITAL_COMMUNITY): Payer: Self-pay | Admitting: Emergency Medicine

## 2019-01-22 DIAGNOSIS — R030 Elevated blood-pressure reading, without diagnosis of hypertension: Secondary | ICD-10-CM | POA: Diagnosis not present

## 2019-01-22 DIAGNOSIS — R42 Dizziness and giddiness: Secondary | ICD-10-CM | POA: Diagnosis not present

## 2019-01-22 NOTE — ED Provider Notes (Signed)
[facility]    CSN: [id number] Arrival date & time: 01/22/19  1520      History   Chief Complaint Chief Complaint  Patient presents with  . Hypertension    HPI [name] is a 43 y.o. female.   Patient presents with brief episodes of dizziness in the last week.  She reports 3-4 episodes occurring randomly, lasting approximately 1 second each time, felt like "the world tilted".  She denies weakness, vision changes, chest pain, shortness of breath, diaphoresis, nausea, or other symptoms.  Patient is currently on Bystolic which was started in 4098 by a cardiologist for tachycardia.  Her medical history is significant for hyperlipidemia, tachycardia, and diabetes.  The history is provided by the patient.    Past Medical History:  Diagnosis Date  . Acne   . Allergic rhinitis   . Anxiety   . Asthma   . Blood in stool   . Chronic bronchitis   . Dyspnea    At times  . Dysrhythmia    Tachycardia on bystolic for  . GERD (gastroesophageal reflux disease)   . HLD (hyperlipidemia)   . Pneumonia    hx of  . Pre-diabetes    On metformin preventively  . Seasonal allergies     Patient Active Problem List   Diagnosis Date Noted  . Controlled type 2 diabetes mellitus without complication, without long-term current use of insulin (HCC) 01/06/2017  . Preventative health care 11/01/2014  . History of anal fissures 11/01/2014  . Elevated TSH 11/01/2014  . Insomnia 08/12/2013  . Obesity 02/27/2011  . Tachycardia 12/25/2010  . HLD (hyperlipidemia) 12/15/2006  . Allergic rhinitis 12/15/2006  . Asthma 12/15/2006  . GERD 12/15/2006    Past Surgical History:  Procedure Laterality Date  . ANAL FISSURE REPAIR N/A 05/23/2016   Procedure: Lateral Internal Sphincterotomy and Excision of Internal Tag;  Surgeon: Avel Peace, MD;  Location: [facility];  Service: General;  Laterality: N/A;  . CHOLECYSTECTOMY    . CYST REMOVAL TRUNK     Bottom of spine  . HEMORROIDECTOMY    .  TONSILLECTOMY      OB History   No obstetric history on file.      Home Medications    Prior to Admission medications   Medication Sig Start Date End Date Taking? Authorizing Provider  Probiotic Product (ULTRAFLORA IMMUNE HEALTH PO) Take by mouth.   Yes [provider]  albuterol (PROAIR HFA) 108 (90 Base) MCG/ACT inhaler INHALE 2 PUFFS INTO THE LUNGS EVERY 6 HOURS AS NEEDED FOR WHEEZING OR SHORTNESS OF BREATH 03/30/18   Doreene Nest, NP  albuterol (PROVENTIL) (2.5 MG/3ML) 0.083% nebulizer solution INHALE CONTENTS OF 1 VIAL VIA NEBULIZER EVERY 6 HOURS AS NEEDED FOR WHEEZING 10/21/18   Doreene Nest, NP  atorvastatin (LIPITOR) 20 MG tablet Take 1 tablet (20 mg total) by mouth daily. For cholesterol. 03/30/18   Doreene Nest, NP  cetirizine (ZYRTEC) 10 MG tablet Take 10 mg by mouth daily.      [provider]  docusate sodium (COLACE) 100 MG capsule Take 100 mg by mouth 2 (two) times daily.     [provider]  DULERA 200-5 MCG/ACT AERO INHALE 2 PUFFS BY MOUTH EVERY DAY 10/18/18   Doreene Nest, NP  MELATONIN PO Take 6 mg by mouth at bedtime.    [provider]  metFORMIN (GLUCOPHAGE) 500 MG tablet TAKE 1 TABLET(500 MG) BY MOUTH TWICE DAILY WITH A MEAL 01/03/19  Pleas Koch, NP  montelukast (SINGULAIR) 10 MG tablet TAKE 1 TABLET BY MOUTH EVERY NIGHT AT BEDTIME 05/03/18   Pleas Koch, NP  nebivolol (BYSTOLIC) 10 MG tablet Take 1 tablet (10 mg total) by mouth daily. 01/03/19   Pleas Koch, NP  OVER THE COUNTER MEDICATION Take 1 capsule by mouth at bedtime. Ultra Flora Probiotic    [provider]  SYEDA 3-0.03 MG tablet TAKE 1 TABLET BY MOUTH DAILY 06/07/18   Pleas Koch, NP    Family History Family History  Problem Relation Age of Onset  . Heart attack Father   . Arthritis Other        Family hx  . Diabetes Other        1st degree relative  . Hypertension Other        Family hx  . Stroke Other         1st degree relative < 50  . Other Other        Cardiovascular disorder--Family hx    Social History Social History   Tobacco Use  . Smoking status: Never Smoker  . Smokeless tobacco: Never Used  Substance Use Topics  . Alcohol use: No    Alcohol/week: 0.0 standard drinks  . Drug use: No     Allergies   Levalbuterol, Buspar [buspirone hcl], Sulfamethoxazole, and Sulfonamide derivatives   Review of Systems Review of Systems  Constitutional: Negative for chills and fever.  HENT: Negative for ear pain and sore throat.   Eyes: Negative for pain and visual disturbance.  Respiratory: Negative for cough and shortness of breath.   Cardiovascular: Negative for chest pain and palpitations.  Gastrointestinal: Negative for abdominal pain and vomiting.  Genitourinary: Negative for dysuria and hematuria.  Musculoskeletal: Negative for arthralgias and back pain.  Skin: Negative for color change and rash.  Neurological: Positive for dizziness. Negative for tremors, seizures, syncope, facial asymmetry, speech difficulty, weakness, light-headedness, numbness and headaches.  All other systems reviewed and are negative.    Physical Exam Triage Vital Signs ED Triage Vitals  Enc Vitals Group     BP 01/22/19 1538 (!) 172/96     Pulse Rate 01/22/19 1538 83     Resp 01/22/19 1538 18     Temp 01/22/19 1538 (!) 97.4 F (36.3 C)     Temp src --      SpO2 01/22/19 1538 96 %     Weight --      Height --      Head Circumference --      Peak Flow --      Pain Score 01/22/19 1539 0     Pain Loc --      Pain Edu? --      Excl. in [place]? --    No data found.  Updated Vital Signs BP (!) 170/101   Pulse 83   Temp (!) 97.4 F (36.3 C)   Resp 18   SpO2 96%   Visual Acuity Right Eye Distance:   Left Eye Distance:   Bilateral Distance:    Right Eye Near:   Left Eye Near:    Bilateral Near:     Physical Exam Vitals signs and nursing note reviewed.  Constitutional:       General: She is not in acute distress.    Appearance: She is well-developed.  HENT:     Head: Normocephalic and atraumatic.     Right Ear: Tympanic membrane normal.  Left Ear: Tympanic membrane normal.     Mouth/Throat:     Mouth: Mucous membranes are moist.  Eyes:     Conjunctiva/sclera: Conjunctivae normal.  Neck:     Musculoskeletal: Neck supple.  Cardiovascular:     Rate and Rhythm: Normal rate and regular rhythm.     Heart sounds: Normal heart sounds. No murmur.  Pulmonary:     Effort: Pulmonary effort is normal. No respiratory distress.     Breath sounds: Normal breath sounds.  Abdominal:     Palpations: Abdomen is soft.     Tenderness: There is no abdominal tenderness. There is no guarding or rebound.  Skin:    General: Skin is warm and dry.  Neurological:     General: No focal deficit present.     Mental Status: She is alert and oriented to person, place, and time.     Sensory: No sensory deficit.     Motor: No weakness.     Coordination: Coordination normal.     Gait: Gait normal.  Psychiatric:        Mood and Affect: Mood normal.        Behavior: Behavior normal.      UC Treatments / Results  Labs (all labs ordered are listed, but only abnormal results are displayed) Labs Reviewed - No data to display  EKG   Radiology No results found.  Procedures Procedures (including critical care time)  Medications Ordered in UC Medications - No data to display  Initial Impression / Assessment and Plan / UC Course  I have reviewed the triage vital signs and the nursing notes.  Pertinent labs & imaging results that were available during my care of the patient were reviewed by me and considered in my medical decision making (see chart for details).   Elevated blood pressure without diagnosis of hypertension.  Dizziness.  EKG shows normal sinus rhythm with rate of 77, no ST elevation, compared to previous in 2018.  Discussed with patient at length that, while  her EKG and exam are normal at this time, she should consider going to the emergency department if she feels that she needs additional work-up.  Discussed that she should go to the emergency department if her symptoms worsen or she develops new symptoms such as chest pain, shortness of breath, weakness, vision changes, or other concerns.  Patient agrees to plan of care.      Final Clinical Impressions(s) / UC Diagnoses   Final diagnoses:  Elevated blood-pressure reading without diagnosis of hypertension  Dizziness     Discharge Instructions     Your blood pressure is elevated today at 172/96 and repeated at 170/101.  Please have this rechecked by your primary care provider at your scheduled visit this week.    Go to the emergency department if your symptoms worsen or you have chest pain, shortness of breath, weakness, vision changes, or other concern symptoms.         ED Prescriptions    None     PDMP not reviewed this encounter.   Mickie Bailate, Neviah Braud H, NP 01/22/19 [phone]

## 2019-01-22 NOTE — ED Triage Notes (Signed)
Pt c/o hypertension since last night, states shes had dizzy spells for the last week. No history of HTN.

## 2019-01-22 NOTE — Discharge Instructions (Addendum)
Your blood pressure is elevated today at 172/96 and repeated at 170/101.  Please have this rechecked by your primary care provider at your scheduled visit this week.    Go to the emergency department if your symptoms worsen or you have chest pain, shortness of breath, weakness, vision changes, or other concern symptoms.

## 2019-01-24 NOTE — Telephone Encounter (Signed)
[name], can you get this patient scheduled with me for tomorrow (Tuesday 10/13)? Raquel Sarna is out.

## 2019-01-24 NOTE — Telephone Encounter (Signed)
[name] pt is schedule 10/13 @ 7:20.  But pt stated she had diarrhea Friday and Saturday.  Is it ok for her to come in office

## 2019-01-25 ENCOUNTER — Encounter: Payer: Self-pay | Admitting: Primary Care

## 2019-01-25 ENCOUNTER — Other Ambulatory Visit: Payer: Self-pay

## 2019-01-25 ENCOUNTER — Ambulatory Visit: Payer: Managed Care, Other (non HMO) | Admitting: Primary Care

## 2019-01-25 VITALS — BP 150/94 | HR 73 | Temp 97.5°F | Ht 62.25 in | Wt 282.0 lb

## 2019-01-25 DIAGNOSIS — E119 Type 2 diabetes mellitus without complications: Secondary | ICD-10-CM

## 2019-01-25 DIAGNOSIS — E785 Hyperlipidemia, unspecified: Secondary | ICD-10-CM | POA: Diagnosis not present

## 2019-01-25 DIAGNOSIS — I10 Essential (primary) hypertension: Secondary | ICD-10-CM

## 2019-01-25 DIAGNOSIS — R7989 Other specified abnormal findings of blood chemistry: Secondary | ICD-10-CM | POA: Diagnosis not present

## 2019-01-25 DIAGNOSIS — R002 Palpitations: Secondary | ICD-10-CM | POA: Diagnosis not present

## 2019-01-25 MED ORDER — LOSARTAN POTASSIUM 25 MG PO TABS: 25.0000 mg | ORAL_TABLET | Freq: Every day | ORAL | 0 refills | Status: DC

## 2019-01-25 NOTE — Addendum Note (Signed)
Addended by: Cloyd Stagers on: 01/25/2019 08:17 AM   Modules accepted: Orders

## 2019-01-25 NOTE — Assessment & Plan Note (Signed)
Compliant to Lipitor, continue same. Repeat lipids pending.

## 2019-01-25 NOTE — Patient Instructions (Signed)
Start losartan 25 mg tablets for blood pressure. Take 1 tablet by mouth every morning.  Continue the Bystolic.  Stop by the lab prior to leaving today. I will notify you of your results once received.   Start monitoring your blood pressure daily, around the same time of day, for the next 2-3 weeks.  Ensure that you have rested for 30 minutes prior to checking your blood pressure. Record your readings and bring them to your next visit.  Schedule a follow up visit for 2-3 weeks for blood pressure check.  It was a pleasure to see you today!

## 2019-01-25 NOTE — Assessment & Plan Note (Signed)
Recent dizziness with elevated BP readings. Repeat TSH pending.

## 2019-01-25 NOTE — Assessment & Plan Note (Signed)
Chronic, doing well overall on Bystolic. Continue same.

## 2019-01-25 NOTE — Progress Notes (Signed)
Subjective:    Patient ID: [name], female    DOB: 24-Feb-1976, 43 y.o.   MRN: [id number]  HPI  [name] is a 43 year old female with a history of asthma, GERD, type 2 diabetes, hyperlipidemia, tachycardia who presents today for follow up of diabetes and a chief complaint of elevated blood pressure readings.  1) Essential Hypertension:   She presented to [facility] on 01/22/19 with a chief complaint of dizziness, feeling as though "the world is tilted". During her stay at Urgent Care her BP was elevated at 172/96 with HR of 83 and again of 170/101 with recheck. She endorsed compliance to her Bystolic for which she's been taking since 2012 for tachycardia. ECG with NSR with rate of 77, no ST elevation. She was directed to the ED if symptoms worsened.  Over the last 24 months BP has ranged 120's/70's-80's until recently. She's been checking her BP at home and is getting readings of 140's-160's/80's/90's. She has felt tired, "woozy headed", tired since her UC visit. Intermittently she's very aware of her heartbeat while at night, will take an extra Bystolic during those events. She does take Melatonin at night to help with sleep.   She does admit to some stress with her son, also with recent death of family members, stress with Covid-19. She has a family history of heart attack in her father who was in his 10940's.   BP Readings from Last 3 Encounters:  01/25/19 (!) 150/94  01/22/19 (!) 170/101  06/29/18 124/80    2) Type 2 Diabetes:   Current medications include: Metformin 500 mg BID  She is checking her blood glucose infrequently and is getting readings of:  80's-113's.   Last A1C: 6.3 in December 2019 Last Eye Exam: Scheduled for this year Last Foot Exam: Due in December 2019 Pneumonia Vaccination: Completed in 2019 ACE/ARB: None. Urine micro due. Statin: Lipitor. LDL of 68 in December 2019    Review of Systems  Constitutional: Negative for fever.  Eyes: Negative for visual  disturbance.  Respiratory: Negative for shortness of breath.   Cardiovascular: Positive for palpitations. Negative for chest pain.  Neurological: Positive for light-headedness. Negative for numbness and headaches.       Past Medical History:  Diagnosis Date  . Acne   . Allergic rhinitis   . Anxiety   . Asthma   . Blood in stool   . Chronic bronchitis   . Dyspnea    At times  . Dysrhythmia    Tachycardia on bystolic for  . GERD (gastroesophageal reflux disease)   . HLD (hyperlipidemia)   . Pneumonia    hx of  . Pre-diabetes    On metformin preventively  . Seasonal allergies      Social History   Socioeconomic History  . Marital status: Married    Spouse name: Not on file  . Number of children: Not on file  . Years of education: Not on file  . Highest education level: Not on file  Occupational History  . Not on file  Social Needs  . Financial resource strain: Not on file  . Food insecurity    Worry: Not on file    Inability: Not on file  . Transportation needs    Medical: Not on file    Non-medical: Not on file  Tobacco Use  . Smoking status: Never Smoker  . Smokeless tobacco: Never Used  Substance and Sexual Activity  . Alcohol use: No    Alcohol/week:  0.0 standard drinks  . Drug use: No  . Sexual activity: Yes    Birth control/protection: Pill  Lifestyle  . Physical activity    Days per week: Not on file    Minutes per session: Not on file  . Stress: Not on file  Relationships  . Social Herbalist on phone: Not on file    Gets together: Not on file    Attends religious service: Not on file    Active member of club or organization: Not on file    Attends meetings of clubs or organizations: Not on file    Relationship status: Not on file  . Intimate partner violence    Fear of current or ex partner: Not on file    Emotionally abused: Not on file    Physically abused: Not on file    Forced sexual activity: Not on file  Other Topics  Concern  . Not on file  Social History Narrative   No regular exercise    Past Surgical History:  Procedure Laterality Date  . ANAL FISSURE REPAIR N/A 05/23/2016   Procedure: Lateral Internal Sphincterotomy and Excision of Internal Tag;  Surgeon: Jackolyn Confer, MD;  Location: [facility];  Service: General;  Laterality: N/A;  . CHOLECYSTECTOMY    . CYST REMOVAL TRUNK     Bottom of spine  . HEMORROIDECTOMY    . TONSILLECTOMY      Family History  Problem Relation Age of Onset  . Heart attack Father   . Arthritis Other        Family hx  . Diabetes Other        1st degree relative  . Hypertension Other        Family hx  . Stroke Other        1st degree relative < 50  . Other Other        Cardiovascular disorder--Family hx    Allergies  Allergen Reactions  . Levalbuterol Shortness Of Breath  . Buspar [Buspirone Hcl]      Fainting feelings   . Sulfamethoxazole     REACTION: unspecified  . Sulfonamide Derivatives     REACTION: rash    Current Outpatient Medications on File Prior to Visit  Medication Sig Dispense Refill  . albuterol (PROAIR HFA) 108 (90 Base) MCG/ACT inhaler INHALE 2 PUFFS INTO THE LUNGS EVERY 6 HOURS AS NEEDED FOR WHEEZING OR SHORTNESS OF BREATH 18 g 0  . albuterol (PROVENTIL) (2.5 MG/3ML) 0.083% nebulizer solution INHALE CONTENTS OF 1 VIAL VIA NEBULIZER EVERY 6 HOURS AS NEEDED FOR WHEEZING 150 mL 0  . aspirin (BAYER ASPIRIN EC LOW DOSE) 81 MG EC tablet     . atorvastatin (LIPITOR) 20 MG tablet Take 1 tablet (20 mg total) by mouth daily. For cholesterol. 90 tablet 3  . cetirizine (ZYRTEC) 10 MG tablet Take 10 mg by mouth daily.      . DULERA 200-5 MCG/ACT AERO INHALE 2 PUFFS BY MOUTH EVERY DAY 13 g 1  . MELATONIN PO Take 6 mg by mouth at bedtime.    . metFORMIN (GLUCOPHAGE) 500 MG tablet TAKE 1 TABLET(500 MG) BY MOUTH TWICE DAILY WITH A MEAL 180 tablet 1  . montelukast (SINGULAIR) 10 MG tablet TAKE 1 TABLET BY MOUTH EVERY NIGHT AT BEDTIME 90 tablet 1  .  nebivolol (BYSTOLIC) 10 MG tablet Take 1 tablet (10 mg total) by mouth daily. 90 tablet 1  . Probiotic Product ([place]) Take by  mouth.    . SYEDA 3-0.03 MG tablet TAKE 1 TABLET BY MOUTH DAILY 84 tablet 2   No current facility-administered medications on file prior to visit.     BP (!) 150/94   Pulse 73   Temp (!) 97.5 F (36.4 C) (Temporal)   Ht 5' 2.25" (1.581 m)   Wt 282 lb (127.9 kg)   SpO2 98%   BMI 51.17 kg/m    Objective:   Physical Exam  Constitutional: She appears well-nourished.  Neck: Neck supple.  Cardiovascular: Normal rate and regular rhythm.  Respiratory: Effort normal and breath sounds normal.  Skin: Skin is warm and dry.  Psychiatric: She has a normal mood and affect.           Assessment & Plan:

## 2019-01-25 NOTE — Assessment & Plan Note (Signed)
No prior history. Recently very elevated readings with symptoms of dizziness.  Given family history of CAD coupled with her medical history and persistently elevated BP readings we will treat.   Rx for losartan 25 mg sent to pharmacy. Continue Bystolic. Labs pending including TSH, CBC, CMP, A1C.  Follow up in 2-3 weeks with BP logs.

## 2019-01-25 NOTE — Assessment & Plan Note (Signed)
Repeat A1C pending.  Managed on statin. Urine microalbumin due next visit. Pneumonia vaccination UTD. Foot exam today.  Continue Metformin.  Encouraged a healthy diet with regular exercise. Follow up in 3-6 months.

## 2019-01-27 ENCOUNTER — Other Ambulatory Visit: Payer: Self-pay

## 2019-01-27 ENCOUNTER — Other Ambulatory Visit: Payer: Managed Care, Other (non HMO)

## 2019-01-27 ENCOUNTER — Ambulatory Visit: Payer: BLUE CROSS/BLUE SHIELD | Admitting: Primary Care

## 2019-01-28 ENCOUNTER — Other Ambulatory Visit: Payer: Self-pay

## 2019-01-28 ENCOUNTER — Other Ambulatory Visit (INDEPENDENT_AMBULATORY_CARE_PROVIDER_SITE_OTHER): Payer: Managed Care, Other (non HMO)

## 2019-01-28 DIAGNOSIS — I10 Essential (primary) hypertension: Secondary | ICD-10-CM

## 2019-01-28 DIAGNOSIS — R7989 Other specified abnormal findings of blood chemistry: Secondary | ICD-10-CM | POA: Diagnosis not present

## 2019-01-28 DIAGNOSIS — E785 Hyperlipidemia, unspecified: Secondary | ICD-10-CM | POA: Diagnosis not present

## 2019-01-28 DIAGNOSIS — E119 Type 2 diabetes mellitus without complications: Secondary | ICD-10-CM

## 2019-01-28 DIAGNOSIS — R002 Palpitations: Secondary | ICD-10-CM | POA: Diagnosis not present

## 2019-01-28 LAB — COMPREHENSIVE METABOLIC PANEL
ALT: 16 U/L (ref 0–35)
AST: 15 U/L (ref 0–37)
Albumin: 3.7 g/dL (ref 3.5–5.2)
Alkaline Phosphatase: 104 U/L (ref 39–117)
BUN: 9 mg/dL (ref 6–23)
CO2: 25 mEq/L (ref 19–32)
Calcium: 9.1 mg/dL (ref 8.4–10.5)
Chloride: 101 mEq/L (ref 96–112)
Creatinine, Ser: 0.64 mg/dL (ref 0.40–1.20)
GFR: 100.97 mL/min (ref >60.00)
Glucose, Bld: 124 mg/dL — ABNORMAL HIGH (ref 70–99)
Potassium: 4 mEq/L (ref 3.5–5.1)
Sodium: 136 mEq/L (ref 135–145)
Total Bilirubin: 0.4 mg/dL (ref 0.2–1.2)
Total Protein: 7.1 g/dL (ref 6.0–8.3)

## 2019-01-28 LAB — LIPID PANEL
Cholesterol: 140 mg/dL (ref 0–200)
HDL: 52.6 mg/dL (ref >39.00)
LDL Cholesterol: 48 mg/dL (ref 0–99)
NonHDL: 86.95
Total CHOL/HDL Ratio: 3
Triglycerides: 195 mg/dL — ABNORMAL HIGH (ref 0.0–149.0)
VLDL: 39 mg/dL (ref 0.0–40.0)

## 2019-01-28 LAB — CBC
HCT: 39.1 % (ref 36.0–46.0)
Hemoglobin: 12.9 g/dL (ref 12.0–15.0)
MCHC: 33 g/dL (ref 30.0–36.0)
MCV: 84.9 fl (ref 78.0–100.0)
Platelets: 224 10*3/uL (ref 150.0–400.0)
RBC: 4.61 Mil/uL (ref 3.87–5.11)
RDW: 13.6 % (ref 11.5–15.5)
WBC: 10.9 10*3/uL — ABNORMAL HIGH (ref 4.0–10.5)

## 2019-01-28 LAB — HEMOGLOBIN A1C: Hgb A1c MFr Bld: 6.9 % — ABNORMAL HIGH (ref 4.6–6.5)

## 2019-01-28 LAB — TSH: TSH: 3.26 u[IU]/mL (ref 0.35–4.50)

## 2019-01-31 ENCOUNTER — Other Ambulatory Visit: Payer: Self-pay | Admitting: Primary Care

## 2019-01-31 DIAGNOSIS — J45909 Unspecified asthma, uncomplicated: Secondary | ICD-10-CM

## 2019-02-03 LAB — HM DIABETES EYE EXAM

## 2019-02-04 ENCOUNTER — Other Ambulatory Visit: Payer: Self-pay | Admitting: Internal Medicine

## 2019-02-04 DIAGNOSIS — J45909 Unspecified asthma, uncomplicated: Secondary | ICD-10-CM

## 2019-02-09 ENCOUNTER — Encounter: Payer: Self-pay | Admitting: Primary Care

## 2019-02-09 ENCOUNTER — Other Ambulatory Visit: Payer: Self-pay

## 2019-02-09 ENCOUNTER — Other Ambulatory Visit: Payer: Self-pay | Admitting: Primary Care

## 2019-02-09 ENCOUNTER — Ambulatory Visit: Payer: Managed Care, Other (non HMO) | Admitting: Primary Care

## 2019-02-09 VITALS — BP 136/76 | HR 86 | Temp 97.0°F | Ht 62.25 in | Wt 281.0 lb

## 2019-02-09 DIAGNOSIS — I10 Essential (primary) hypertension: Secondary | ICD-10-CM | POA: Diagnosis not present

## 2019-02-09 DIAGNOSIS — E785 Hyperlipidemia, unspecified: Secondary | ICD-10-CM

## 2019-02-09 DIAGNOSIS — E119 Type 2 diabetes mellitus without complications: Secondary | ICD-10-CM | POA: Diagnosis not present

## 2019-02-09 DIAGNOSIS — Z3041 Encounter for surveillance of contraceptive pills: Secondary | ICD-10-CM

## 2019-02-09 DIAGNOSIS — F411 Generalized anxiety disorder: Secondary | ICD-10-CM | POA: Diagnosis not present

## 2019-02-09 LAB — MICROALBUMIN / CREATININE URINE RATIO
Creatinine,U: 342.2 mg/dL
Microalb Creat Ratio: 0.4 mg/g (ref 0.0–30.0)
Microalb, Ur: 1.4 mg/dL (ref 0.0–1.9)

## 2019-02-09 MED ORDER — LOSARTAN POTASSIUM 25 MG PO TABS: 25.0000 mg | ORAL_TABLET | Freq: Every day | ORAL | 1 refills | Status: DC

## 2019-02-09 MED ORDER — SERTRALINE HCL 50 MG PO TABS: 50.0000 mg | ORAL_TABLET | Freq: Every day | ORAL | 1 refills | Status: AC

## 2019-02-09 NOTE — Progress Notes (Signed)
   Subjective:    Patient ID: [name], female    DOB: 05/14/1975, 43 y.o.   MRN: [id number]  HPI [name] is a 43 year old female with a history of hypertension, HLD, type 2 diabetes, family history of CAD, GERD, and asthma who presents today for a follow-up of her hypertension.   She was last seen 01/25/19 for elevated blood pressure readings following a visit to urgent care for symptomatic hypertension. At that visit she was started on Losartan 25 mg daily in addition to continuing her Bystolic10 mg daily.   She is managed on Lipitor 20 mg tablet daily for her cholesterol and metformin 500 mg tablet twice daily for her diabetes. Recent labs (cholesterol panel, TSH) were unremarkable. Last A1C was 6.9 on 01/25/19. She is due for a urine microalbumin today. She completed her diabetic eye exam on 02/03/19. Next diabetes follow-up due in 3-6 months.   1) Hypertension: Since her last visit she has been checking her blood pressure daily, with readings ranging from 120s/70s-130s/80s with one reading of 155/77. HR 60s/70s. She is adherent to her Losartan and Bystolic. She has had one episode of feeling "swimmy headed" on 02/06/19 when she went out to dinner, she was seated at the time, the episode resolved after a few seconds.The restaurant was crowded and loud and she thinks anxiety may have contributed to her feeling overwhelmed. She does report a history of anxiety and has taken valium in the past for same.   2) Anxiety: She reports a lot of stress at home with her son. She has had episodes of swimmy headedness, similar to the one she reports on 10/25 with anxiety in the past. She also reports symptoms of intermittent palpitations, feeling overwhelmed and intermittent chest tightness. GAD 7 score is 15 today.        BP Readings from Last 3 Encounters:  02/09/19 136/76  01/25/19 (!) 150/94  01/22/19 (!) 170/101    Review of Systems  Respiratory: Positive for chest tightness (Reports  intermittent "twinges" to her chest with anxiety).   Cardiovascular: Positive for palpitations (intermittent). Negative for chest pain.  Gastrointestinal: Positive for abdominal pain (Intermittent RUQ/LUQ discomfort). Negative for diarrhea, nausea and vomiting.  Endocrine: Positive for polyuria.  Neurological: Positive for light-headedness (occassional feelings of "swimmy headedness"). Negative for dizziness (occassional feelings of "swimmy headedness"), syncope and headaches.  Psychiatric/Behavioral: The patient is nervous/anxious.        Objective:   Physical Exam Cardiovascular:     Rate and Rhythm: Normal rate and regular rhythm.     Heart sounds: Normal heart sounds.  Pulmonary:     Breath sounds: Normal breath sounds.  Neurological:     Mental Status: She is alert and oriented to person, place, and time.           Assessment & Plan:

## 2019-02-09 NOTE — Assessment & Plan Note (Addendum)
A1C 01/25/19 6.9.   Managed on statin. Urine Microalbumin pending.  Eye exam completed 02/03/19.  Foot exam completed 10/13.  Pneumonia vaccine UTD.  Continue metformin.  Follow-up in 3-6 months.   Agree with plan, Allie Bossier, NP-C

## 2019-02-09 NOTE — Assessment & Plan Note (Addendum)
Lipid panel completed 10/13.   Continue Lipitor.   Repeat lipids 01/2020.  Agree with plan. Allie Bossier, NP-C

## 2019-02-09 NOTE — Assessment & Plan Note (Addendum)
Home BP readings improved. Anxiety may be contributing to hypertension.  Continue Losartan and Bystolic.  Start Sertraline 50mg  daily for anxiety.   Follow-up in six weeks.   Agree with plan, Allie Bossier, NP-C

## 2019-02-09 NOTE — Progress Notes (Signed)
Subjective:    Patient ID: [name], female    DOB: 1976-03-20, 43 y.o.   MRN: [id number]  HPI  Ms. [name] is a 43 year old female with a history of hypertension, type 2 diabetes, hyperlipidemia who presents today for follow up of hypertension.  She was last evaluated on 01/25/19 for urgent care follow up of dizziness and hypertension. BP readings were in the 170's/90's-100's at [facility], home readings of 140's-160's/90's despite taking Bystolic as prescribed. She did endorse increased stress with family. Given elevated readings we added losartan 25 mg daily for blood pressure control. She was asked to follow up today.  Since her last visit she's checking her BP at home which is running 120's-130's/70's-80's with HR in the 60's and 70's. She continues to endorse continued anxiety and stress with family, has lightheadedness with anxiety. Denies chest pain.  GAD 7 score of 15 today.   BP Readings from Last 3 Encounters:  02/09/19 136/76  01/25/19 (!) 150/94  01/22/19 (!) 170/101     Review of Systems  Eyes: Negative for visual disturbance.  Respiratory: Negative for shortness of breath.   Cardiovascular: Negative for chest pain.  Neurological: Positive for light-headedness.  Psychiatric/Behavioral: The patient is nervous/anxious.        See HPI       Past Medical History:  Diagnosis Date  . Acne   . Allergic rhinitis   . Anxiety   . Asthma   . Blood in stool   . Chronic bronchitis   . Dyspnea    At times  . Dysrhythmia    Tachycardia on bystolic for  . GERD (gastroesophageal reflux disease)   . HLD (hyperlipidemia)   . Pneumonia    hx of  . Pre-diabetes    On metformin preventively  . Seasonal allergies      Social History   Socioeconomic History  . Marital status: Married    Spouse name: Not on file  . Number of children: Not on file  . Years of education: Not on file  . Highest education level: Not on file  Occupational History  . Not on file  Social Needs   . Financial resource strain: Not on file  . Food insecurity    Worry: Not on file    Inability: Not on file  . Transportation needs    Medical: Not on file    Non-medical: Not on file  Tobacco Use  . Smoking status: Never Smoker  . Smokeless tobacco: Never Used  Substance and Sexual Activity  . Alcohol use: No    Alcohol/week: 0.0 standard drinks  . Drug use: No  . Sexual activity: Yes    Birth control/protection: Pill  Lifestyle  . Physical activity    Days per week: Not on file    Minutes per session: Not on file  . Stress: Not on file  Relationships  . Social Herbalist on phone: Not on file    Gets together: Not on file    Attends religious service: Not on file    Active member of club or organization: Not on file    Attends meetings of clubs or organizations: Not on file    Relationship status: Not on file  . Intimate partner violence    Fear of current or ex partner: Not on file    Emotionally abused: Not on file    Physically abused: Not on file    Forced sexual activity: Not on file  Other Topics Concern  . Not on file  Social History Narrative   No regular exercise    Past Surgical History:  Procedure Laterality Date  . ANAL FISSURE REPAIR N/A 05/23/2016   Procedure: Lateral Internal Sphincterotomy and Excision of Internal Tag;  Surgeon: Avel Peace, MD;  Location: [facility];  Service: General;  Laterality: N/A;  . CHOLECYSTECTOMY    . CYST REMOVAL TRUNK     Bottom of spine  . HEMORROIDECTOMY    . TONSILLECTOMY      Family History  Problem Relation Age of Onset  . Heart attack Father   . Arthritis Other        Family hx  . Diabetes Other        1st degree relative  . Hypertension Other        Family hx  . Stroke Other        1st degree relative < 50  . Other Other        Cardiovascular disorder--Family hx    Allergies  Allergen Reactions  . Levalbuterol Shortness Of Breath  . Buspar [Buspirone Hcl]      Fainting feelings   .  Sulfamethoxazole     REACTION: unspecified  . Sulfonamide Derivatives     REACTION: rash    Current Outpatient Medications on File Prior to Visit  Medication Sig Dispense Refill  . albuterol (PROAIR HFA) 108 (90 Base) MCG/ACT inhaler INHALE 2 PUFFS INTO THE LUNGS EVERY 6 HOURS AS NEEDED FOR WHEEZING OR SHORTNESS OF BREATH 18 g 1  . albuterol (PROVENTIL) (2.5 MG/3ML) 0.083% nebulizer solution INHALE CONTENTS OF 1 VIAL VIA NEBULIZER EVERY 6 HOURS AS NEEDED FOR WHEEZING 150 mL 0  . atorvastatin (LIPITOR) 20 MG tablet Take 1 tablet (20 mg total) by mouth daily. For cholesterol. 90 tablet 3  . cetirizine (ZYRTEC) 10 MG tablet Take 10 mg by mouth daily.      . DULERA 200-5 MCG/ACT AERO INHALE 2 PUFFS BY MOUTH EVERY DAY 13 g 1  . MELATONIN PO Take 6 mg by mouth at bedtime.    . metFORMIN (GLUCOPHAGE) 500 MG tablet TAKE 1 TABLET(500 MG) BY MOUTH TWICE DAILY WITH A MEAL 180 tablet 1  . montelukast (SINGULAIR) 10 MG tablet TAKE 1 TABLET BY MOUTH EVERY NIGHT AT BEDTIME 90 tablet 1  . nebivolol (BYSTOLIC) 10 MG tablet Take 1 tablet (10 mg total) by mouth daily. 90 tablet 1  . Probiotic Product (ULTRAFLORA IMMUNE HEALTH PO) Take by mouth.    . SYEDA 3-0.03 MG tablet TAKE 1 TABLET BY MOUTH DAILY 84 tablet 2   No current facility-administered medications on file prior to visit.     BP 136/76   Pulse 86   Temp (!) 97 F (36.1 C) (Temporal)   Ht 5' 2.25" (1.581 m)   Wt 281 lb (127.5 kg)   LMP 01/23/2019   SpO2 96%   BMI 50.98 kg/m    Objective:   Physical Exam  Constitutional: She appears well-nourished.  Neck: Neck supple.  Cardiovascular: Normal rate and regular rhythm.  Respiratory: Effort normal and breath sounds normal.  Skin: Skin is warm and dry.  Psychiatric: She has a normal mood and affect.           Assessment & Plan:

## 2019-02-09 NOTE — Patient Instructions (Signed)
Start taking Sertraline (Zoloft) 50 mg tablet daily for anxiety. Start by taking 1/2 tablet daily for 8 days, then increase to 1 tablet daily.   Continue taking your current blood pressure medications as prescribed.   Start exercising. You should be getting 150 minutes of moderate intensity exercise weekly.  It's important to improve your diet by reducing consumption of fast food, fried food, processed snack foods, sugary drinks. Increase consumption of fresh vegetables and fruits, whole grains, water.  Ensure you are drinking 64 ounces of water daily.  Schedule a follow-up appointment in 6 weeks.   It was a pleasure to see you today!

## 2019-02-09 NOTE — Assessment & Plan Note (Addendum)
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  Start Zoloft 50 mg daily (half tablet for 8 days, then increase to full dose).  Return/safety precautions provided.   Discussed therapy, she will contact a therapist after checking her insurance.   Follow-up in 6 weeks.   Agree with plan, Allie Bossier, NP-C

## 2019-02-19 ENCOUNTER — Other Ambulatory Visit: Payer: Self-pay | Admitting: Primary Care

## 2019-02-19 DIAGNOSIS — I10 Essential (primary) hypertension: Secondary | ICD-10-CM

## 2019-03-08 ENCOUNTER — Other Ambulatory Visit: Payer: Self-pay | Admitting: Primary Care

## 2019-03-08 DIAGNOSIS — J45909 Unspecified asthma, uncomplicated: Secondary | ICD-10-CM

## 2019-03-08 MED ORDER — DULERA 200-5 MCG/ACT IN AERO: INHALATION_SPRAY | RESPIRATORY_TRACT | 2 refills | Status: DC

## 2019-03-22 ENCOUNTER — Other Ambulatory Visit: Payer: Self-pay | Admitting: Primary Care

## 2019-03-22 DIAGNOSIS — E785 Hyperlipidemia, unspecified: Secondary | ICD-10-CM

## 2019-03-23 ENCOUNTER — Encounter: Payer: Self-pay | Admitting: Primary Care

## 2019-03-23 ENCOUNTER — Ambulatory Visit: Payer: Managed Care, Other (non HMO) | Admitting: Primary Care

## 2019-03-23 ENCOUNTER — Other Ambulatory Visit: Payer: Self-pay

## 2019-03-23 DIAGNOSIS — F411 Generalized anxiety disorder: Secondary | ICD-10-CM | POA: Diagnosis not present

## 2019-03-23 DIAGNOSIS — I10 Essential (primary) hypertension: Secondary | ICD-10-CM | POA: Diagnosis not present

## 2019-03-23 LAB — BASIC METABOLIC PANEL
BUN: 9 mg/dL (ref 6–23)
CO2: 27 mEq/L (ref 19–32)
Calcium: 9.4 mg/dL (ref 8.4–10.5)
Chloride: 101 mEq/L (ref 96–112)
Creatinine, Ser: 0.67 mg/dL (ref 0.40–1.20)
GFR: 95.7 mL/min (ref >60.00)
Glucose, Bld: 134 mg/dL — ABNORMAL HIGH (ref 70–99)
Potassium: 4 mEq/L (ref 3.5–5.1)
Sodium: 136 mEq/L (ref 135–145)

## 2019-03-23 MED ORDER — LOSARTAN POTASSIUM 50 MG PO TABS: 50.0000 mg | ORAL_TABLET | Freq: Every day | ORAL | 3 refills | Status: AC

## 2019-03-23 NOTE — Progress Notes (Signed)
   Subjective:    Patient ID: [name], female    DOB: 07-27-1975, 42 y.o.   MRN: [id number]  HPI [name] is a 43 year old female with a history of hypertension, hyperlipidemia, type 2 diabetes, and generalized anxiety disorder who presents today for a follow-up of her anxiety and hypertension.   1) Anxiety: She was started on Sertraline 50 mg daily on 02/09/19 for increased anxiety, with recommendations to take a half tablet for 8 days. She sent MyChart message on 11/19, stating that she could not tolerate the medication due to diarrhea and stopped taking the medication at that time.   Since then she reports an overall improvement in her symptoms, with some isolated episodes of palpations, during which she took an extra dose of her bystolic and her symptoms resolved. She feels that overall she is managing well and is not interested in trying another daily medication at this time.   2) Hypertension: She is currently managed on Losartan 25 mg and Nebivolol 10 mg daily, with home reading in the 140s/70s, highest reading 155/92, low 113/72. She does report occasional palpitations with increased stress, she denies chest pain, shortness of breath, dizziness, or headaches.       BP Readings from Last 3 Encounters:  03/23/19 130/86  02/09/19 136/76  01/25/19 (!) 150/94      Review of Systems  Constitutional: Negative for fatigue.  Respiratory: Negative for shortness of breath.   Cardiovascular: Negative for chest pain and palpitations.  Gastrointestinal: Negative for diarrhea.  Allergic/Immunologic: Positive for environmental allergies.  Neurological: Negative for dizziness and headaches.  Psychiatric/Behavioral: The patient is not nervous/anxious.        Objective:   Physical Exam Cardiovascular:     Rate and Rhythm: Normal rate and regular rhythm.     Pulses: Normal pulses.     Heart sounds: Normal heart sounds.  Pulmonary:     Effort: Pulmonary effort is normal.   Breath sounds: Normal breath sounds.  Neurological:     Mental Status: She is alert and oriented to person, place, and time.           Assessment & Plan:

## 2019-03-23 NOTE — Patient Instructions (Addendum)
Stop taking Sertraline 25 mg daily for anxiety.   Start taking Losartan 50 mg daily for blood pressure. Continue to check your blood pressure at home. Please update me via MyChart with your home readings in two weeks.   Stop by the lab prior to leaving today. I will notify you of your results once received.   Schedule a follow-up appointment for you annual physical for April 2021.   It was a pleasure to see you today!

## 2019-03-23 NOTE — Progress Notes (Signed)
Subjective:    Patient ID: [name], female    DOB: November 01, 1975, 43 y.o.   MRN: [id number]  HPI  [name] is a 43 year old female with a history of hypertension, type 2 diabetes, hyperlipidemia, anxiety who presents today for follow up.  She was last evaluated on 02/09/19 for follow up of hypertension and diabetes, also endorsed moderate anxiety given family stress with GAD 7 score of 15. We suspected her elevated BP readings to partially be secondary to stress/anxiety so she was initiated on Zoloft 50 mg and asked to follow up today.  She contacted [place] back shortly with reports of intolerance to Zoloft and therefore decided to discontinue.   Since her last visit she's been checking her blood pressure which has been running in the 130's-140's/70's with a few 323'F and 573'U systolic. HR in the 70's. BP is higher when she feels stressed. She is managed on losartan 25 mg and nebivolol 10 mg, but has been taking an extra dose of her Bystolic when she feels anxious.   Overall she feels as though she's managing her anxiety well and is not interested in anxiety treatment.   BP Readings from Last 3 Encounters:  03/23/19 130/86  02/09/19 136/76  01/25/19 (!) 150/94     Review of Systems  Eyes: Negative for visual disturbance.  Respiratory:       Seasonal allergy SOB  Cardiovascular: Negative for chest pain.  Allergic/Immunologic: Positive for environmental allergies.  Neurological: Negative for dizziness and headaches.       Past Medical History:  Diagnosis Date  . Acne   . Allergic rhinitis   . Anxiety   . Asthma   . Blood in stool   . Chronic bronchitis   . Dyspnea    At times  . Dysrhythmia    Tachycardia on bystolic for  . GERD (gastroesophageal reflux disease)   . HLD (hyperlipidemia)   . Pneumonia    hx of  . Pre-diabetes    On metformin preventively  . Seasonal allergies      Social History   Socioeconomic History  . Marital status: Married    Spouse  name: Not on file  . Number of children: Not on file  . Years of education: Not on file  . Highest education level: Not on file  Occupational History  . Not on file  Social Needs  . Financial resource strain: Not on file  . Food insecurity    Worry: Not on file    Inability: Not on file  . Transportation needs    Medical: Not on file    Non-medical: Not on file  Tobacco Use  . Smoking status: Never Smoker  . Smokeless tobacco: Never Used  Substance and Sexual Activity  . Alcohol use: No    Alcohol/week: 0.0 standard drinks  . Drug use: No  . Sexual activity: Yes    Birth control/protection: Pill  Lifestyle  . Physical activity    Days per week: Not on file    Minutes per session: Not on file  . Stress: Not on file  Relationships  . Social Herbalist on phone: Not on file    Gets together: Not on file    Attends religious service: Not on file    Active member of club or organization: Not on file    Attends meetings of clubs or organizations: Not on file    Relationship status: Not on file  . Intimate  partner violence    Fear of current or ex partner: Not on file    Emotionally abused: Not on file    Physically abused: Not on file    Forced sexual activity: Not on file  Other Topics Concern  . Not on file  Social History Narrative   No regular exercise    Past Surgical History:  Procedure Laterality Date  . ANAL FISSURE REPAIR N/A 05/23/2016   Procedure: Lateral Internal Sphincterotomy and Excision of Internal Tag;  Surgeon: Avel Peace, MD;  Location: [facility];  Service: General;  Laterality: N/A;  . CHOLECYSTECTOMY    . CYST REMOVAL TRUNK     Bottom of spine  . HEMORROIDECTOMY    . TONSILLECTOMY      Family History  Problem Relation Age of Onset  . Heart attack Father   . Arthritis Other        Family hx  . Diabetes Other        1st degree relative  . Hypertension Other        Family hx  . Stroke Other        1st degree relative < 50  .  Other Other        Cardiovascular disorder--Family hx    Allergies  Allergen Reactions  . Levalbuterol Shortness Of Breath  . Buspar [Buspirone Hcl]      Fainting feelings   . Sulfamethoxazole     REACTION: unspecified  . Sulfonamide Derivatives     REACTION: rash    Current Outpatient Medications on File Prior to Visit  Medication Sig Dispense Refill  . albuterol (PROAIR HFA) 108 (90 Base) MCG/ACT inhaler INHALE 2 PUFFS INTO THE LUNGS EVERY 6 HOURS AS NEEDED FOR WHEEZING OR SHORTNESS OF BREATH 18 g 1  . albuterol (PROVENTIL) (2.5 MG/3ML) 0.083% nebulizer solution INHALE CONTENTS OF 1 VIAL VIA NEBULIZER EVERY 6 HOURS AS NEEDED FOR WHEEZING 150 mL 0  . atorvastatin (LIPITOR) 20 MG tablet TAKE 1 TABLET BY MOUTH DAILY FOR CHOLESTEROL 90 tablet 1  . cetirizine (ZYRTEC) 10 MG tablet Take 10 mg by mouth daily.      Marland Kitchen losartan (COZAAR) 25 MG tablet Take 1 tablet (25 mg total) by mouth daily. For blood pressure. 90 tablet 1  . MELATONIN PO Take 6 mg by mouth at bedtime.    . metFORMIN (GLUCOPHAGE) 500 MG tablet TAKE 1 TABLET(500 MG) BY MOUTH TWICE DAILY WITH A MEAL 180 tablet 1  . mometasone-formoterol (DULERA) 200-5 MCG/ACT AERO INHALE 2 PUFFS BY MOUTH EVERY DAY 13 g 2  . montelukast (SINGULAIR) 10 MG tablet TAKE 1 TABLET BY MOUTH EVERY NIGHT AT BEDTIME 90 tablet 1  . nebivolol (BYSTOLIC) 10 MG tablet Take 1 tablet (10 mg total) by mouth daily. 90 tablet 1  . Probiotic Product (ULTRAFLORA IMMUNE HEALTH PO) Take by mouth.    . sertraline (ZOLOFT) 50 MG tablet Take 1 tablet (50 mg total) by mouth daily. For anxiety. 30 tablet 1  . SYEDA 3-0.03 MG tablet TAKE 1 TABLET BY MOUTH DAILY 84 tablet 2   No current facility-administered medications on file prior to visit.     BP 130/86   Pulse 82   Temp (!) 97.1 F (36.2 C) (Temporal)   Ht 5' 2.25" (1.581 m)   Wt 282 lb 8 oz (128.1 kg)   LMP 03/18/2019   SpO2 97%   BMI 51.26 kg/m    Objective:   Physical Exam  Constitutional: She  appears well-nourished.  Neck: Neck supple.  Cardiovascular: Normal rate and regular rhythm.  Respiratory: Effort normal and breath sounds normal.  Skin: Skin is warm and dry.  Psychiatric: She has a normal mood and affect.           Assessment & Plan:

## 2019-03-23 NOTE — Assessment & Plan Note (Addendum)
Overall improved with isolated episodes of palpitations, managed with bystolic.   She declines daily medication at this time.  Continue to monitor.  Agree with plan, Pleas Koch, NP

## 2019-03-23 NOTE — Assessment & Plan Note (Addendum)
Remains above goal, with home readings mostly in the 140s/70s.  Given elevated readings, will increase Losartan to 50 mg daily.   BMP pending today.   She will provide update with home blood pressure readings via MyChart in 2 weeks.   Follow-up for CPE in April 2021.  Agree with plan, Allie Bossier, NP-C

## 2019-03-24 ENCOUNTER — Other Ambulatory Visit: Payer: Self-pay | Admitting: Primary Care

## 2019-03-24 DIAGNOSIS — J45909 Unspecified asthma, uncomplicated: Secondary | ICD-10-CM

## 2019-04-08 ENCOUNTER — Emergency Department (HOSPITAL_COMMUNITY)
Admission: EM | Admit: 2019-04-08 | Discharge: 2019-04-08 | Disposition: A | Discharge status: Home / Self Care | Payer: Managed Care, Other (non HMO) | Attending: Emergency Medicine | Admitting: Emergency Medicine

## 2019-04-08 ENCOUNTER — Emergency Department (HOSPITAL_COMMUNITY): Payer: Managed Care, Other (non HMO)

## 2019-04-08 ENCOUNTER — Encounter (HOSPITAL_COMMUNITY): Payer: Self-pay | Admitting: Pharmacy Technician

## 2019-04-08 ENCOUNTER — Other Ambulatory Visit: Payer: Self-pay

## 2019-04-08 DIAGNOSIS — Z79899 Other long term (current) drug therapy: Secondary | ICD-10-CM | POA: Diagnosis not present

## 2019-04-08 DIAGNOSIS — R030 Elevated blood-pressure reading, without diagnosis of hypertension: Secondary | ICD-10-CM

## 2019-04-08 DIAGNOSIS — I10 Essential (primary) hypertension: Secondary | ICD-10-CM | POA: Insufficient documentation

## 2019-04-08 DIAGNOSIS — Z7984 Long term (current) use of oral hypoglycemic drugs: Secondary | ICD-10-CM | POA: Insufficient documentation

## 2019-04-08 DIAGNOSIS — R42 Dizziness and giddiness: Secondary | ICD-10-CM | POA: Insufficient documentation

## 2019-04-08 DIAGNOSIS — J45909 Unspecified asthma, uncomplicated: Secondary | ICD-10-CM | POA: Diagnosis not present

## 2019-04-08 DIAGNOSIS — E119 Type 2 diabetes mellitus without complications: Secondary | ICD-10-CM | POA: Insufficient documentation

## 2019-04-08 LAB — CBC
HCT: 41.3 % (ref 36.0–46.0)
Hemoglobin: 13.2 g/dL (ref 12.0–15.0)
MCH: 27.7 pg (ref 26.0–34.0)
MCHC: 32 g/dL (ref 30.0–36.0)
MCV: 86.6 fL (ref 80.0–100.0)
Platelets: 261 10*3/uL (ref 150–400)
RBC: 4.77 MIL/uL (ref 3.87–5.11)
RDW: 13.4 % (ref 11.5–15.5)
WBC: 8.8 10*3/uL (ref 4.0–10.5)
nRBC: 0 % (ref 0.0–0.2)

## 2019-04-08 LAB — URINALYSIS, ROUTINE W REFLEX MICROSCOPIC
Bilirubin Urine: NEGATIVE
Glucose, UA: NEGATIVE mg/dL
Hgb urine dipstick: NEGATIVE
Ketones, ur: NEGATIVE mg/dL
Nitrite: NEGATIVE
Protein, ur: NEGATIVE mg/dL
Specific Gravity, Urine: 1.003 — ABNORMAL LOW (ref 1.005–1.030)
pH: 6 (ref 5.0–8.0)

## 2019-04-08 LAB — BASIC METABOLIC PANEL
Anion gap: 9 (ref 5–15)
BUN: 7 mg/dL (ref 6–20)
CO2: 26 mmol/L (ref 22–32)
Calcium: 9.6 mg/dL (ref 8.9–10.3)
Chloride: 103 mmol/L (ref 98–111)
Creatinine, Ser: 0.69 mg/dL (ref 0.44–1.00)
GFR calc Af Amer: >60 mL/min (ref >60)
GFR calc non Af Amer: >60 mL/min (ref >60)
Glucose, Bld: 199 mg/dL — ABNORMAL HIGH (ref 70–99)
Potassium: 4.4 mmol/L (ref 3.5–5.1)
Sodium: 138 mmol/L (ref 135–145)

## 2019-04-08 LAB — I-STAT BETA HCG BLOOD, ED (MC, WL, AP ONLY): I-stat hCG, quantitative: <5 m[IU]/mL (ref <5)

## 2019-04-08 MED ORDER — SODIUM CHLORIDE 0.9% FLUSH: 3.0000 mL | Freq: Once | INTRAVENOUS | Status: DC

## 2019-04-08 NOTE — ED Triage Notes (Signed)
Pt pov with hypertension X several months. Started on BP meds and states they recently increased her losartan. Pt endorses "dizzy spell" this morning. Denies dizziness currently. No neuro deficits noted.

## 2019-04-08 NOTE — ED Notes (Signed)
Patient verbalizes understanding of discharge instructions. Opportunity for questioning and answers were provided. Armband removed by staff, pt discharged from ED.  

## 2019-04-08 NOTE — Discharge Instructions (Addendum)
You have been seen today for high blood pressure and dizziness. Please read and follow all provided instructions. Return to the emergency room for worsening condition or new concerning symptoms.    Vitals:   04/08/19 1356 04/08/19 1359 04/08/19 1401 04/08/19 1402  BP: 135/69  (!) 128/52   Pulse:  72  75  Resp:      Temp:      TempSrc:      SpO2:  97%  99%     1. Medications:  Continue usual home medications.  I recommend you talk to your PCP about possible adjustment to your losartan.  Take medications as prescribed. Please review all of the medicines and only take them if you do not have an allergy to them.   2. Treatment: rest, drink plenty of fluids  3. Follow Up:  Please follow up with primary care provider by scheduling an appointment as soon as possible for a visit     It is also a possibility that you have an allergic reaction to any of the medicines that you have been prescribed - Everybody reacts differently to medications and while MOST people have no trouble with most medicines, you may have a reaction such as nausea, vomiting, rash, swelling, shortness of breath. If this is the case, please stop taking the medicine immediately and contact your physician.  ?

## 2019-04-08 NOTE — ED Notes (Signed)
Patient transported to X-ray 

## 2019-04-08 NOTE — ED Provider Notes (Signed)
[facility]   CSN: [id number] Arrival date & time: 04/08/19  1114     History Chief Complaint  Patient presents with  . Hypertension  . Dizziness    [name] is a 43 y.o. female with past medical history significant for asthma, GERD, hyperlipidemia, hypertension, prediabetes presents to emergency department with chief complaint of hypertension x 2 weeks. She checks her blood pressure daily before bed and she has had systolic readings in the 150s and 160s.  She reports her normal is 120 systolic.  This morning while getting ready to travel she had a sudden onset of lightheadedness. She describes it as feeling like the room was spinning. It last approximately 2 minutes. She checked her blood pressure and it was elevated, she does not remember exact number. She has history of similar episodes prior to starting Losartan which led to her hypertension diagnosis. She states this episode resolved without intervention.    She denies any fever, chills, chest pain, shortness of breath, palpitations, abdominal pain, nausea, vomiting, numbness, weakness, syncope, lower extremity edema.  She denies any sick contacts or contact with anyone positive for COVID-19.  Chart review shows she had an office visit for essential hypertension on 03/23/2019.  Losartan was increased from 25 to 50 mg daily.  She is also on nebivolol 10 mg daily.  Past Medical History:  Diagnosis Date  . Acne   . Allergic rhinitis   . Anxiety   . Asthma   . Blood in stool   . Chronic bronchitis   . Dyspnea    At times  . Dysrhythmia    Tachycardia on bystolic for  . GERD (gastroesophageal reflux disease)   . HLD (hyperlipidemia)   . Pneumonia    hx of  . Pre-diabetes    On metformin preventively  . Seasonal allergies     Patient Active Problem List   Diagnosis Date Noted  . GAD (generalized anxiety disorder) 02/09/2019  . Essential hypertension 01/25/2019    . Controlled type 2 diabetes mellitus without complication, without long-term current use of insulin (HCC) 01/06/2017  . Preventative health care 11/01/2014  . History of anal fissures 11/01/2014  . Elevated TSH 11/01/2014  . Insomnia 08/12/2013  . Obesity 02/27/2011  . Palpitations 12/25/2010  . HLD (hyperlipidemia) 12/15/2006  . Allergic rhinitis 12/15/2006  . Asthma 12/15/2006  . GERD 12/15/2006    Past Surgical History:  Procedure Laterality Date  . ANAL FISSURE REPAIR N/A 05/23/2016   Procedure: Lateral Internal Sphincterotomy and Excision of Internal Tag;  Surgeon: Avel Peaceodd Rosenbower, MD;  Location: [facility];  Service: General;  Laterality: N/A;  . CHOLECYSTECTOMY    . CYST REMOVAL TRUNK     Bottom of spine  . HEMORROIDECTOMY    . TONSILLECTOMY       OB History   No obstetric history on file.     Family History  Problem Relation Age of Onset  . Heart attack Father   . Arthritis Other        Family hx  . Diabetes Other        1st degree relative  . Hypertension Other        Family hx  . Stroke Other        1st degree relative < 50  . Other Other        Cardiovascular disorder--Family hx    Social History   Tobacco Use  . Smoking status: Never Smoker  .  Smokeless tobacco: Never Used  Substance Use Topics  . Alcohol use: No    Alcohol/week: 0.0 standard drinks  . Drug use: No    Home Medications Prior to Admission medications   Medication Sig Start Date End Date Taking? Authorizing Provider  albuterol (PROVENTIL) (2.5 MG/3ML) 0.083% nebulizer solution INHALE CONTENTS OF 1 VIAL VIA NEBULIZER EVERY 6 HOURS AS NEEDED FOR WHEEZING 10/21/18   Doreene Nest, NP  albuterol (VENTOLIN HFA) 108 (90 Base) MCG/ACT inhaler INHALE 2 PUFFS INTO THE LUNGS EVERY 6 HOURS AS NEEDED FOR WHEEZING OR SHORTNESS OF BREATH 03/24/19   Doreene Nest, NP  atorvastatin (LIPITOR) 20 MG tablet TAKE 1 TABLET BY MOUTH DAILY FOR CHOLESTEROL 03/22/19   Doreene Nest, NP   cetirizine (ZYRTEC) 10 MG tablet Take 10 mg by mouth daily.      [provider]  losartan (COZAAR) 50 MG tablet Take 1 tablet (50 mg total) by mouth daily. For blood pressure. 03/23/19   Doreene Nest, NP  MELATONIN PO Take 6 mg by mouth at bedtime.    [provider]  metFORMIN (GLUCOPHAGE) 500 MG tablet TAKE 1 TABLET(500 MG) BY MOUTH TWICE DAILY WITH A MEAL 01/03/19   Doreene Nest, NP  mometasone-formoterol [facility]) 200-5 MCG/ACT AERO INHALE 2 PUFFS BY MOUTH EVERY DAY 03/08/19   Doreene Nest, NP  montelukast (SINGULAIR) 10 MG tablet TAKE 1 TABLET BY MOUTH EVERY NIGHT AT BEDTIME 01/31/19   Doreene Nest, NP  nebivolol (BYSTOLIC) 10 MG tablet Take 1 tablet (10 mg total) by mouth daily. 01/03/19   Doreene Nest, NP  Probiotic Product [facility] IMMUNE HEALTH PO) Take by mouth.    [provider]  sertraline (ZOLOFT) 50 MG tablet Take 1 tablet (50 mg total) by mouth daily. For anxiety. 02/09/19   Doreene Nest, NP  SYEDA 3-0.03 MG tablet TAKE 1 TABLET BY MOUTH DAILY 02/10/19   Doreene Nest, NP    Allergies    Levalbuterol, Buspar [buspirone hcl], Sulfamethoxazole, and Sulfonamide derivatives  Review of Systems   Review of Systems  All other systems are reviewed and are negative for acute change except as noted in the HPI.   Physical Exam Updated Vital Signs BP (!) 119/59   Pulse 70   Temp 98.7 F (37.1 C) (Oral)   Resp 16   LMP 03/18/2019   SpO2 97%   Physical Exam Vitals and nursing note reviewed.  Constitutional:      General: She is not in acute distress.    Appearance: She is not ill-appearing.  HENT:     Head: Normocephalic and atraumatic.     Right Ear: Tympanic membrane and external ear normal.     Left Ear: Tympanic membrane and external ear normal.     Nose: Nose normal.     Mouth/Throat:     Mouth: Mucous membranes are moist.     Pharynx: Oropharynx is clear.  Eyes:     General: No scleral icterus.        Right eye: No discharge.        Left eye: No discharge.     Extraocular Movements: Extraocular movements intact.     Conjunctiva/sclera: Conjunctivae normal.     Pupils: Pupils are equal, round, and reactive to light.  Neck:     Vascular: No JVD.  Cardiovascular:     Rate and Rhythm: Normal rate and regular rhythm.     Pulses: Normal pulses.  Radial pulses are 2+ on the right side and 2+ on the left side.     Heart sounds: Normal heart sounds.  Pulmonary:     Comments: Lungs clear to auscultation in all fields. Symmetric chest rise. No wheezing, rales, or rhonchi. Abdominal:     Comments: Abdomen is soft, non-distended, and non-tender in all quadrants. No rigidity, no guarding. No peritoneal signs.  Musculoskeletal:        General: Normal range of motion.     Cervical back: Normal range of motion.     Right lower leg: No edema.     Left lower leg: No edema.  Skin:    General: Skin is warm and dry.     Capillary Refill: Capillary refill takes less than 2 seconds.  Neurological:     Mental Status: She is oriented to person, place, and time.     GCS: GCS eye subscore is 4. GCS verbal subscore is 5. GCS motor subscore is 6.     Comments: Mental Status:  Alert, oriented, thought content appropriate, able to give a coherent history. Speech fluent without evidence of aphasia. Able to follow 2 step commands without difficulty.  Cranial Nerves:  II:  Peripheral visual fields grossly normal, pupils equal, round, reactive to light III,IV, VI: ptosis not present, extra-ocular motions intact bilaterally  V,VII: smile symmetric, facial light touch sensation equal VIII: hearing grossly normal to voice  X: uvula elevates symmetrically  XI: bilateral shoulder shrug symmetric and strong XII: midline tongue extension without fassiculations Motor:  Normal tone. 5/5 in upper and lower extremities bilaterally including strong and equal grip strength and dorsiflexion/plantar  flexion Sensory: Pinprick and light touch normal in all extremities.  Deep Tendon Reflexes: 2+ and symmetric in the biceps and patella Cerebellar: normal finger-to-nose with bilateral upper extremities Gait: normal gait and balance CV: distal pulses palpable throughout     Psychiatric:        Mood and Affect: Mood is anxious.        Behavior: Behavior normal.       ED Results / Procedures / Treatments   Labs (all labs ordered are listed, but only abnormal results are displayed) Labs Reviewed  BASIC METABOLIC PANEL - Abnormal; Notable for the following components:      Result Value   Glucose, Bld 199 (*)    All other components within normal limits  URINALYSIS, ROUTINE W REFLEX MICROSCOPIC - Abnormal; Notable for the following components:   Color, Urine STRAW (*)    APPearance HAZY (*)    Specific Gravity, Urine 1.003 (*)    Leukocytes,Ua TRACE (*)    Bacteria, UA MANY (*)    All other components within normal limits  CBC  I-STAT BETA HCG BLOOD, ED ([facility], [facility], [facility] ONLY)    EKG EKG Interpretation  Date/Time:  Friday April 08 2019 11:25:27 EST Ventricular Rate:  83 PR Interval:  160 QRS Duration: 82 QT Interval:  382 QTC Calculation: 448 R Axis:   50 Text Interpretation: Normal sinus rhythm Normal ECG No significant change since last tracing Confirmed by Linwood Dibbles [phone]) on 04/08/2019 2:21:32 PM   Radiology DG Chest 2 View  Result Date: 04/08/2019 CLINICAL DATA:  Chest pain and shortness of breath. EXAM: CHEST - 2 VIEW COMPARISON:  07/20/2016 FINDINGS: The lungs are clear without focal pneumonia, edema, pneumothorax or pleural effusion. The cardiopericardial silhouette is within normal limits for size. The visualized bony structures of the thorax are intact. IMPRESSION: Normal exam. Electronically  Signed   By: Misty Stanley M.D.   On: 04/08/2019 14:56    Procedures Procedures (including critical care time)  Medications Ordered in ED Medications  sodium  chloride flush (NS) 0.9 % injection 3 mL (has no administration in time range)    ED Course  I have reviewed the triage vital signs and the nursing notes.  Pertinent labs & imaging results that were available during my care of the patient were reviewed by me and considered in my medical decision making (see chart for details).     Vitals:   04/08/19 1401 04/08/19 1402 04/08/19 1415 04/08/19 1430  BP: (!) 128/52  117/70 (!) 119/59  Pulse:  75 71 70  Resp:      Temp:      TempSrc:      SpO2:  99% 96% 97%    MDM Rules/Calculators/A&P                      43 yo female presents with chief complaint of elevated blood pressure and dizzy. Pt is in no acute distress, non toxic appearing. She is not hypertensive in triage, BP on arrival is 139/80. She describes the dizziness as the room spinning which is more consistent with lightheadedness. She has normal neurological exam.  Will screen with EKG and basic labs CBC, BMP, UA, troponin to exclude endorgan damage. She does appear anxious on exam.  EKG viewed by me shows normal sinus rhythm, no signs of ischemia.  Labs today are without leukocytosis, severe electrolyte derangement, renal insufficiency.  Pregnancy test is negative.  UA unremarkable.  I viewed pt's chest xray and it does not suggest acute infectious processes.  She states her blood pressure has been elevated ever since her recent dose increase of losartan x2 weeks ago. Her highest systolic reading at home has been 160. She has a reassuring exam and vitals while in the ED. No signs of hypertensive urgency or emergency.  Discussed blood pressure with the patient and the need for primary care follow up with potential need to initiate or change antihypertensive medications and/or for further evaluation. Discussed return precaution signs/symptoms for hypertensive emergency including headache, change in vision, numbness, weakness, chest pain, dyspnea, dizziness, or lightheadedness with the  patient. Pt confirmed understanding. Pt is stable at discharge.   Portions of this note were generated with Lobbyist. Dictation errors may occur despite best attempts at proofreading.   Final Clinical Impression(s) / ED Diagnoses Final diagnoses:  Elevated blood pressure reading    Rx / DC Orders ED Discharge Orders    None       Flint Melter 04/08/19 1627    Dorie Rank, MD 04/08/19 2126

## 2019-04-11 NOTE — Telephone Encounter (Signed)
Pt left v/m that she had sent my chart message to Gentry Fitz NP earlier today and pt request cb about med mgt of losartan; pt does not want to have any more dizzy spells. Pt in ED on 04/08/19 and that was only time she had dizziness.  Pt request cb with next step.Please advise.

## 2019-04-11 NOTE — Telephone Encounter (Signed)
Rena, I am out of the office this week until the 31st, could you have routed to another provider? Amy, FYI. This continues to occur while I'm out of the office and is very frustrating.  I just happened to check my messages this evening so I will handle.

## 2019-04-14 ENCOUNTER — Ambulatory Visit: Payer: Managed Care, Other (non HMO) | Admitting: *Deleted

## 2019-04-14 ENCOUNTER — Other Ambulatory Visit: Payer: Self-pay

## 2019-04-14 DIAGNOSIS — I10 Essential (primary) hypertension: Secondary | ICD-10-CM

## 2019-04-14 NOTE — Progress Notes (Signed)
Patient here for nurse visit BP check per order from Allie Bossier, NP.   Patient reports compliance with prescribed BP medications: yes  BP checked in Left and Right upper arm with XL Adult manual cuff  Left: 122/62 @ 9:15 Right: 126/68 @ 9:18  HR: 78 SpO2: 97%   BP Readings from Last 3 Encounters:  04/08/19 (!) 119/59  03/23/19 130/86  02/09/19 136/76   Pulse Readings from Last 3 Encounters:  04/08/19 70  03/23/19 82  02/09/19 86    Pt aware that I will get these readings to K. Carlis Abbott, NP to address and that someone will call with further instructions. Pt reports that she is still getting high readings on her cuff at home - 160s range.  Confirmed she is resting prior to checking and is using her Left Arm with Large cuff -- pt uses a manual cuff at times along with her automatic.    Stepahnie Campo M, CMA

## 2019-04-18 ENCOUNTER — Telehealth: Payer: Self-pay | Admitting: *Deleted

## 2019-04-18 DIAGNOSIS — J45909 Unspecified asthma, uncomplicated: Secondary | ICD-10-CM

## 2019-04-18 MED ORDER — DULERA 200-5 MCG/ACT IN AERO: INHALATION_SPRAY | RESPIRATORY_TRACT | 1 refills | Status: DC

## 2019-04-18 NOTE — Telephone Encounter (Signed)
Message left for patient to return my call.  

## 2019-04-18 NOTE — Telephone Encounter (Signed)
Prescription changed and sent to pharmacy.

## 2019-04-18 NOTE — Telephone Encounter (Signed)
Patient left a voicemail stating that with her new insurance she has to get a 3 month supply on her refills. Patient stated that if she gets just one inhaler of her Elwin Sleight it cost her over $300. Patient is requesting that a new script for her Dulera inhaler be sent to [facility] with 3 inhalers. Patient requested a call back that this can be done.

## 2019-04-19 MED ORDER — DULERA 200-5 MCG/ACT IN AERO: INHALATION_SPRAY | RESPIRATORY_TRACT | 1 refills | Status: AC

## 2019-04-19 NOTE — Telephone Encounter (Signed)
Noted, prescription corrected.

## 2019-04-19 NOTE — Telephone Encounter (Signed)
Patient contacted the office and stated that she takes 4 puffs a day every now if she has issues with her breathing when the weather changes - and she states the pharmacist advised her that with her insurance, her prescription should be written for 4 puffs a day. Is this ok?

## 2019-04-19 NOTE — Telephone Encounter (Signed)
Spoken and notified patient of Kate Clark's comments. Patient verbalized understanding.  

## 2019-04-19 NOTE — Telephone Encounter (Signed)
Please advise on comments below

## 2019-04-25 ENCOUNTER — Ambulatory Visit (INDEPENDENT_AMBULATORY_CARE_PROVIDER_SITE_OTHER): Payer: Managed Care, Other (non HMO) | Admitting: Primary Care

## 2019-04-25 ENCOUNTER — Ambulatory Visit: Payer: Managed Care, Other (non HMO) | Admitting: Primary Care

## 2019-04-25 DIAGNOSIS — U071 COVID-19: Secondary | ICD-10-CM | POA: Insufficient documentation

## 2019-04-25 DIAGNOSIS — Z20822 Contact with and (suspected) exposure to covid-19: Secondary | ICD-10-CM

## 2019-04-25 DIAGNOSIS — R109 Unspecified abdominal pain: Secondary | ICD-10-CM | POA: Diagnosis not present

## 2019-04-25 NOTE — Assessment & Plan Note (Signed)
Mostly RUQ but upper abdominal bloating. Also with "lump" to right periumbilical region.  Since symptoms are chronic there is no need to order stat imaging but I would like to get an ultrasound as soon as she's out of quarantine from likely Covid-19 infection. She's feeling better today and is not vomiting which is reassuring.  She will update once she's out of quarantine, we will order imaging at that time. She will update if symptoms increase.

## 2019-04-25 NOTE — Progress Notes (Signed)
Subjective:    Patient ID: [name], female    DOB: [birth year], 44 y.o.   MRN: [id number]  HPI  Virtual Visit via Video Note  I connected with [name] on 04/25/19 at  2:40 PM EST by a video enabled telemedicine application and verified that I am speaking with the correct person using two identifiers.  Location: Patient: Home Provider: Office   I discussed the limitations of evaluation and management by telemedicine and the availability of in person appointments. The patient expressed understanding and agreed to proceed.  History of Present Illness:  [name] is a 44 year old female with a history of hypertension, asthma, GERD, type 2 diabetes, hyperlipidemia who presents today to discuss Covid symptoms and abdominal pain.   Her husband tested positive for Covid-19 on 04/23/19. She's noticed symptoms of cough, chest congestion, mild shortness of breath. She's compliant to her inhalers with improvement. She's now feeling slight fatigue and "warmth".  She's also noticed tenderness with a "pinching" feeling to the right upper quadrant with upper abdomen distension. These symptoms have been chronic and intermittent for years. History of cholecystectomy. Over the last two weeks symptoms have increased, and has also noticed a hard "lump" to the right side of her umbilicus.   She denies nausea, vomiting. She has had some diarrhea but thinks this is attributed to Covid-19. She's unsure if she has a fevers. She has an appointment for a Covid-19 test for tomorrow. Overall her abdominal symptoms have improved today.    Observations/Objective:  Alert and oriented. Appears well, not sickly. No distress. Speaking in complete sentences. Mild cough during visit.  Assessment and Plan:  See problem based charting.  Follow Up Instructions:  Complete Covid-19 testing as discussed.  Use your nebulizer albuterol as needed for shortness of breath, continue using your [facility]  inhaler.  Remain quarantined as discussed.  Please notify me when you are cleared to come out of quarantine so we can order your abdominal ultrasound.  It was a pleasure to see you today! Tricia Reel, NP-C    I discussed the assessment and treatment plan with the patient. The patient was provided an opportunity to ask questions and all were answered. The patient agreed with the plan and demonstrated an understanding of the instructions.   The patient was advised to call back or seek an in-person evaluation if the symptoms worsen or if the condition fails to improve as anticipated.    Tricia Nest, NP    Review of Systems  Constitutional: Positive for fatigue. Negative for chills and fever.  HENT: Positive for congestion.   Respiratory: Positive for cough and shortness of breath.   Gastrointestinal: Positive for diarrhea.       Abdominal "tenderness" chronic       Past Medical History:  Diagnosis Date  . Acne   . Allergic rhinitis   . Anxiety   . Asthma   . Blood in stool   . Chronic bronchitis   . Dyspnea    At times  . Dysrhythmia    Tachycardia on bystolic for  . GERD (gastroesophageal reflux disease)   . HLD (hyperlipidemia)   . Pneumonia    hx of  . Pre-diabetes    On metformin preventively  . Seasonal allergies      Social History   Socioeconomic History  . Marital status: Married    Spouse name: Not on file  . Number of children: Not on file  . Years of education:  Not on file  . Highest education level: Not on file  Occupational History  . Not on file  Tobacco Use  . Smoking status: Never Smoker  . Smokeless tobacco: Never Used  Substance and Sexual Activity  . Alcohol use: No    Alcohol/week: 0.0 standard drinks  . Drug use: No  . Sexual activity: Yes    Birth control/protection: Pill  Other Topics Concern  . Not on file  Social History Narrative   No regular exercise   Social Determinants of Health   Financial Resource Strain:    . Difficulty of Paying Living Expenses: Not on file  Food Insecurity:   . Worried About Programme researcher, broadcasting/film/video in the Last Year: Not on file  . Ran Out of Food in the Last Year: Not on file  Transportation Needs:   . Lack of Transportation (Medical): Not on file  . Lack of Transportation (Non-Medical): Not on file  Physical Activity:   . Days of Exercise per Week: Not on file  . Minutes of Exercise per Session: Not on file  Stress:   . Feeling of Stress : Not on file  Social Connections:   . Frequency of Communication with Friends and Family: Not on file  . Frequency of Social Gatherings with Friends and Family: Not on file  . Attends Religious Services: Not on file  . Active Member of Clubs or Organizations: Not on file  . Attends Banker Meetings: Not on file  . Marital Status: Not on file  Intimate Partner Violence:   . Fear of Current or Ex-Partner: Not on file  . Emotionally Abused: Not on file  . Physically Abused: Not on file  . Sexually Abused: Not on file    Past Surgical History:  Procedure Laterality Date  . ANAL FISSURE REPAIR N/A 05/23/2016   Procedure: Lateral Internal Sphincterotomy and Excision of Internal Tag;  Surgeon: Avel Peace, MD;  Location: [facility];  Service: General;  Laterality: N/A;  . CHOLECYSTECTOMY    . CYST REMOVAL TRUNK     Bottom of spine  . HEMORROIDECTOMY    . TONSILLECTOMY      Family History  Problem Relation Age of Onset  . Heart attack Father   . Arthritis Other        Family hx  . Diabetes Other        1st degree relative  . Hypertension Other        Family hx  . Stroke Other        1st degree relative < 50  . Other Other        Cardiovascular disorder--Family hx    Allergies  Allergen Reactions  . Levalbuterol Shortness Of Breath  . Buspar [Buspirone Hcl]      Fainting feelings   . Sulfamethoxazole     REACTION: unspecified  . Sulfonamide Derivatives     REACTION: rash    Current Outpatient  Medications on File Prior to Visit  Medication Sig Dispense Refill  . albuterol (PROVENTIL) (2.5 MG/3ML) 0.083% nebulizer solution INHALE CONTENTS OF 1 VIAL VIA NEBULIZER EVERY 6 HOURS AS NEEDED FOR WHEEZING 150 mL 0  . albuterol (VENTOLIN HFA) 108 (90 Base) MCG/ACT inhaler INHALE 2 PUFFS INTO THE LUNGS EVERY 6 HOURS AS NEEDED FOR WHEEZING OR SHORTNESS OF BREATH 8.5 g 0  . atorvastatin (LIPITOR) 20 MG tablet TAKE 1 TABLET BY MOUTH DAILY FOR CHOLESTEROL 90 tablet 1  . cetirizine (ZYRTEC) 10 MG tablet Take  10 mg by mouth daily.      Marland Kitchen losartan (COZAAR) 50 MG tablet Take 1 tablet (50 mg total) by mouth daily. For blood pressure. 90 tablet 3  . MELATONIN PO Take 6 mg by mouth at bedtime.    . metFORMIN (GLUCOPHAGE) 500 MG tablet TAKE 1 TABLET(500 MG) BY MOUTH TWICE DAILY WITH A MEAL 180 tablet 1  . mometasone-formoterol (DULERA) 200-5 MCG/ACT AERO INHALE 1-2 PUFFS BY MOUTH twice daily. 39 g 1  . montelukast (SINGULAIR) 10 MG tablet TAKE 1 TABLET BY MOUTH EVERY NIGHT AT BEDTIME 90 tablet 1  . nebivolol (BYSTOLIC) 10 MG tablet Take 1 tablet (10 mg total) by mouth daily. 90 tablet 1  . Probiotic Product ([place]) Take by mouth.    . sertraline (ZOLOFT) 50 MG tablet Take 1 tablet (50 mg total) by mouth daily. For anxiety. 30 tablet 1  . SYEDA 3-0.03 MG tablet TAKE 1 TABLET BY MOUTH DAILY 84 tablet 2   No current facility-administered medications on file prior to visit.    There were no vitals taken for this visit.   Objective:   Physical Exam  Constitutional: She is oriented to person, place, and time. She appears well-nourished. She does not appear ill.  Respiratory: Effort normal.  Dry cough noted once during exam  Neurological: She is alert and oriented to person, place, and time.  Psychiatric: She has a normal mood and affect.           Assessment & Plan:

## 2019-04-25 NOTE — Patient Instructions (Signed)
Complete Covid-19 testing as discussed.  Use your nebulizer albuterol as needed for shortness of breath, continue using your [facility] inhaler.  Remain quarantined as discussed.  Please notify me when you are cleared to come out of quarantine so we can order your abdominal ultrasound.  It was a pleasure to see you today! Mayra Reel, NP-C

## 2019-04-25 NOTE — Assessment & Plan Note (Signed)
Husband tested positive two days ago, she is now developing symptoms. Her test is pending for tomorrow.  Discussed conservative home treatment, albuterol nebulizer use, etc. She appeared stable today.

## 2019-04-26 ENCOUNTER — Other Ambulatory Visit: Payer: Managed Care, Other (non HMO)

## 2019-04-26 ENCOUNTER — Ambulatory Visit: Payer: Managed Care, Other (non HMO) | Attending: Internal Medicine

## 2019-04-26 DIAGNOSIS — Z20822 Contact with and (suspected) exposure to covid-19: Secondary | ICD-10-CM

## 2019-04-27 DIAGNOSIS — J4530 Mild persistent asthma, uncomplicated: Secondary | ICD-10-CM

## 2019-04-27 MED ORDER — PREDNISONE 20 MG PO TABS: ORAL_TABLET | ORAL | 0 refills | Status: DC

## 2019-04-28 LAB — NOVEL CORONAVIRUS, NAA: SARS-CoV-2, NAA: DETECTED — AB

## 2019-04-29 ENCOUNTER — Other Ambulatory Visit: Payer: Self-pay

## 2019-04-29 DIAGNOSIS — J45909 Unspecified asthma, uncomplicated: Secondary | ICD-10-CM

## 2019-04-29 MED ORDER — ALBUTEROL SULFATE (2.5 MG/3ML) 0.083% IN NEBU: INHALATION_SOLUTION | RESPIRATORY_TRACT | 0 refills | Status: AC

## 2019-04-29 NOTE — Telephone Encounter (Signed)
Please advise if refill is appropriate   Last refill: 7.9.20 #150 mL, 0

## 2019-04-29 NOTE — Telephone Encounter (Signed)
Refill sent to pharmacy.   

## 2019-05-01 ENCOUNTER — Emergency Department (HOSPITAL_COMMUNITY): Payer: Managed Care, Other (non HMO)

## 2019-05-01 ENCOUNTER — Encounter (HOSPITAL_COMMUNITY): Payer: Self-pay | Admitting: Emergency Medicine

## 2019-05-01 ENCOUNTER — Other Ambulatory Visit: Payer: Self-pay

## 2019-05-01 ENCOUNTER — Emergency Department (HOSPITAL_COMMUNITY)
Admission: EM | Admit: 2019-05-01 | Discharge: 2019-05-01 | Disposition: A | Discharge status: Home / Self Care | Payer: Managed Care, Other (non HMO) | Source: Home / Self Care | Attending: Emergency Medicine | Admitting: Emergency Medicine

## 2019-05-01 DIAGNOSIS — U071 COVID-19: Secondary | ICD-10-CM

## 2019-05-01 DIAGNOSIS — Z79899 Other long term (current) drug therapy: Secondary | ICD-10-CM | POA: Insufficient documentation

## 2019-05-01 DIAGNOSIS — Z7984 Long term (current) use of oral hypoglycemic drugs: Secondary | ICD-10-CM | POA: Insufficient documentation

## 2019-05-01 DIAGNOSIS — E119 Type 2 diabetes mellitus without complications: Secondary | ICD-10-CM | POA: Insufficient documentation

## 2019-05-01 DIAGNOSIS — J1289 Other viral pneumonia: Secondary | ICD-10-CM | POA: Insufficient documentation

## 2019-05-01 DIAGNOSIS — I10 Essential (primary) hypertension: Secondary | ICD-10-CM | POA: Insufficient documentation

## 2019-05-01 LAB — BASIC METABOLIC PANEL
Anion gap: 13 (ref 5–15)
BUN: 9 mg/dL (ref 6–20)
CO2: 19 mmol/L — ABNORMAL LOW (ref 22–32)
Calcium: 8.7 mg/dL — ABNORMAL LOW (ref 8.9–10.3)
Chloride: 103 mmol/L (ref 98–111)
Creatinine, Ser: 0.75 mg/dL (ref 0.44–1.00)
GFR calc Af Amer: >60 mL/min (ref >60)
GFR calc non Af Amer: >60 mL/min (ref >60)
Glucose, Bld: 162 mg/dL — ABNORMAL HIGH (ref 70–99)
Potassium: 4.3 mmol/L (ref 3.5–5.1)
Sodium: 135 mmol/L (ref 135–145)

## 2019-05-01 LAB — CBC
HCT: 38.8 % (ref 36.0–46.0)
Hemoglobin: 12.5 g/dL (ref 12.0–15.0)
MCH: 27.8 pg (ref 26.0–34.0)
MCHC: 32.2 g/dL (ref 30.0–36.0)
MCV: 86.2 fL (ref 80.0–100.0)
Platelets: 191 10*3/uL (ref 150–400)
RBC: 4.5 MIL/uL (ref 3.87–5.11)
RDW: 13.9 % (ref 11.5–15.5)
WBC: 6.6 10*3/uL (ref 4.0–10.5)
nRBC: 0 % (ref 0.0–0.2)

## 2019-05-01 LAB — I-STAT BETA HCG BLOOD, ED (MC, WL, AP ONLY): I-stat hCG, quantitative: <5 m[IU]/mL (ref <5)

## 2019-05-01 LAB — TROPONIN I (HIGH SENSITIVITY): Troponin I (High Sensitivity): 4 ng/L (ref <18)

## 2019-05-01 MED ORDER — SODIUM CHLORIDE 0.9% FLUSH: 3.0000 mL | Freq: Once | INTRAVENOUS | Status: DC

## 2019-05-01 NOTE — ED Notes (Signed)
Pt anxious about her diagnosis and breathing.  NAD or resp distress, able to speak full sentences without difficulty.  Attempt at IV x 1 without success, able to get blood samples.  No acute needs noted.

## 2019-05-01 NOTE — ED Triage Notes (Signed)
Pt states she was diagnosed with COVID around 1/12.  Reports increased SOB and "sternum" pain.

## 2019-05-01 NOTE — ED Notes (Signed)
Able to ambulate in room (I) without acute distress noted.  Sats remained above 90 consistently with a range of 93-99%.

## 2019-05-01 NOTE — ED Provider Notes (Signed)
[place] EMERGENCY DEPARTMENT Provider Note   CSN: [id number] Arrival date & time: 05/01/19  0844     History Chief Complaint  Patient presents with  . COVID +  . Shortness of Breath    [name] is a 44 y.o. female.  HPI   Patient presents to the emergency room for evaluation of cough, shortness of breath and chest discomfort.  Patient was diagnosed with Covid infection on January 12.  Patient does have a history of asthma and chronic bronchitis.  She continues to cough and feels like she is having more difficulty with her breathing.  It especially worse last night.  She noticed that her oxygen saturation decreased to around 93% when usually it is around 98%.  She is also having pain behind her sternum.  It increases with coughing.  She has had continued fevers as well.  She has a headache.  No vomiting or diarrhea.  Past Medical History:  Diagnosis Date  . Acne   . Allergic rhinitis   . Anxiety   . Asthma   . Blood in stool   . Chronic bronchitis   . Dyspnea    At times  . Dysrhythmia    Tachycardia on bystolic for  . GERD (gastroesophageal reflux disease)   . HLD (hyperlipidemia)   . Pneumonia    hx of  . Pre-diabetes    On metformin preventively  . Seasonal allergies     Patient Active Problem List   Diagnosis Date Noted  . Abdominal pain 04/25/2019  . Suspected COVID-19 virus infection 04/25/2019  . GAD (generalized anxiety disorder) 02/09/2019  . Essential hypertension 01/25/2019  . Controlled type 2 diabetes mellitus without complication, without long-term current use of insulin ([place]) 01/06/2017  . Preventative health care 11/01/2014  . History of anal fissures 11/01/2014  . Elevated TSH 11/01/2014  . Insomnia 08/12/2013  . Obesity 02/27/2011  . Palpitations 12/25/2010  . HLD (hyperlipidemia) 12/15/2006  . Allergic rhinitis 12/15/2006  . Asthma 12/15/2006  . GERD 12/15/2006    Past Surgical History:  Procedure Laterality  Date  . ANAL FISSURE REPAIR N/A 05/23/2016   Procedure: Lateral Internal Sphincterotomy and Excision of Internal Tag;  Surgeon: Jackolyn Confer, MD;  Location: [facility];  Service: General;  Laterality: N/A;  . CHOLECYSTECTOMY    . CYST REMOVAL TRUNK     Bottom of spine  . HEMORROIDECTOMY    . TONSILLECTOMY       OB History   No obstetric history on file.     Family History  Problem Relation Age of Onset  . Heart attack Father   . Arthritis Other        Family hx  . Diabetes Other        1st degree relative  . Hypertension Other        Family hx  . Stroke Other        1st degree relative < 50  . Other Other        Cardiovascular disorder--Family hx    Social History   Tobacco Use  . Smoking status: Never Smoker  . Smokeless tobacco: Never Used  Substance Use Topics  . Alcohol use: No    Alcohol/week: 0.0 standard drinks  . Drug use: No    Home Medications Prior to Admission medications   Medication Sig Start Date End Date Taking? Authorizing Provider  albuterol (PROVENTIL) (2.5 MG/3ML) 0.083% nebulizer solution INHALE CONTENTS OF 1 VIAL VIA NEBULIZER EVERY 6  HOURS AS NEEDED FOR WHEEZING 04/29/19   Doreene Nest, NP  albuterol (VENTOLIN HFA) 108 (90 Base) MCG/ACT inhaler INHALE 2 PUFFS INTO THE LUNGS EVERY 6 HOURS AS NEEDED FOR WHEEZING OR SHORTNESS OF BREATH 03/24/19   Doreene Nest, NP  atorvastatin (LIPITOR) 20 MG tablet TAKE 1 TABLET BY MOUTH DAILY FOR CHOLESTEROL 03/22/19   Doreene Nest, NP  cetirizine (ZYRTEC) 10 MG tablet Take 10 mg by mouth daily.      [provider]  losartan (COZAAR) 50 MG tablet Take 1 tablet (50 mg total) by mouth daily. For blood pressure. 03/23/19   Doreene Nest, NP  MELATONIN PO Take 6 mg by mouth at bedtime.    [provider]  metFORMIN (GLUCOPHAGE) 500 MG tablet TAKE 1 TABLET(500 MG) BY MOUTH TWICE DAILY WITH A MEAL 01/03/19   Doreene Nest, NP  mometasone-formoterol (DULERA) 200-5 MCG/ACT AERO  INHALE 1-2 PUFFS BY MOUTH twice daily. 04/19/19   Doreene Nest, NP  montelukast (SINGULAIR) 10 MG tablet TAKE 1 TABLET BY MOUTH EVERY NIGHT AT BEDTIME 01/31/19   Doreene Nest, NP  nebivolol (BYSTOLIC) 10 MG tablet Take 1 tablet (10 mg total) by mouth daily. 01/03/19   Doreene Nest, NP  predniSONE (DELTASONE) 20 MG tablet Take 2 tablets once daily for five days. 04/27/19   Doreene Nest, NP  Probiotic Product [facility] IMMUNE HEALTH PO) Take by mouth.    [provider]  sertraline (ZOLOFT) 50 MG tablet Take 1 tablet (50 mg total) by mouth daily. For anxiety. 02/09/19   Doreene Nest, NP  SYEDA 3-0.03 MG tablet TAKE 1 TABLET BY MOUTH DAILY 02/10/19   Doreene Nest, NP    Allergies    Levalbuterol, Buspar [buspirone hcl], Sulfamethoxazole, and Sulfonamide derivatives  Review of Systems   Review of Systems  All other systems reviewed and are negative.   Physical Exam Updated Vital Signs BP (!) 147/70 (BP Location: Left Arm)   Pulse 98   Temp (!) 100.4 F (38 C) (Oral)   Resp 18   LMP 04/10/2019   SpO2 94%   Physical Exam Vitals and nursing note reviewed.  Constitutional:      General: She is not in acute distress.    Appearance: She is well-developed.  HENT:     Head: Normocephalic and atraumatic.     Right Ear: External ear normal.     Left Ear: External ear normal.  Eyes:     General: No scleral icterus.       Right eye: No discharge.        Left eye: No discharge.     Conjunctiva/sclera: Conjunctivae normal.  Neck:     Trachea: No tracheal deviation.  Cardiovascular:     Rate and Rhythm: Normal rate and regular rhythm.  Pulmonary:     Effort: Pulmonary effort is normal. No respiratory distress.     Breath sounds: Normal breath sounds. No stridor. No decreased breath sounds, wheezing, rhonchi or rales.  Abdominal:     General: Bowel sounds are normal. There is no distension.     Palpations: Abdomen is soft.     Tenderness:  There is no abdominal tenderness. There is no guarding or rebound.  Musculoskeletal:        General: No tenderness.     Cervical back: Neck supple.  Skin:    General: Skin is warm and dry.     Findings: No rash.  Neurological:  Mental Status: She is alert.     Cranial Nerves: No cranial nerve deficit (no facial droop, extraocular movements intact, no slurred speech).     Sensory: No sensory deficit.     Motor: No abnormal muscle tone or seizure activity.     Coordination: Coordination normal.     ED Results / Procedures / Treatments   Labs (all labs ordered are listed, but only abnormal results are displayed) Labs Reviewed  BASIC METABOLIC PANEL - Abnormal; Notable for the following components:      Result Value   CO2 19 (*)    Glucose, Bld 162 (*)    Calcium 8.7 (*)    All other components within normal limits  CBC  I-STAT BETA HCG BLOOD, ED ([facility], [facility], [facility] ONLY)  TROPONIN I (HIGH SENSITIVITY)    EKG EKG Interpretation  Date/Time:  Sunday May 01 2019 08:57:00 EST Ventricular Rate:  90 PR Interval:  154 QRS Duration: 80 QT Interval:  344 QTC Calculation: 420 R Axis:   29 Text Interpretation: Normal sinus rhythm Cannot rule out Anterior infarct , age undetermined Abnormal ECG No significant change since last tracing Confirmed by Linwood Dibbles [phone]) on 05/01/2019 9:38:31 AM   Radiology DG Chest Portable 1 View  Result Date: 05/01/2019 CLINICAL DATA:  Chest pain. EXAM: PORTABLE CHEST 1 VIEW COMPARISON:  April 08, 2019. FINDINGS: The heart size and mediastinal contours are within normal limits. No pneumothorax or pleural effusion is noted. Minimal ill-defined patchy opacities are noted throughout both lungs suggesting multifocal pneumonia. The visualized skeletal structures are unremarkable. IMPRESSION: Minimal ill-defined patchy opacities are noted throughout both lungs suggesting multifocal pneumonia. Electronically Signed   By: Lupita Raider M.D.   On: 05/01/2019  10:16    Procedures Procedures (including critical care time)  Medications Ordered in ED Medications  sodium chloride flush (NS) 0.9 % injection 3 mL (3 mLs Intravenous Not Given 05/01/19 1109)    ED Course  I have reviewed the triage vital signs and the nursing notes.  Pertinent labs & imaging results that were available during my care of the patient were reviewed by me and considered in my medical decision making (see chart for details).    MDM Rules/Calculators/A&P                      Patient presented to the ED for worsening symptoms associated with COVID-19.  Patient's x-ray does show some pulmonary infiltrates.  Laboratory tests are otherwise reassuring.  Patient's oxygenation status is reassuring.  Her oxygen saturation has remained above 94% even when ambulating.  At this time no indication for hospitalization.  Discussed these findings with the patient.  I will have her continue her home medications.  She is taking supplemental vitamin D which I have asked her to continue.  Warning signs and precautions discussed.  Gyanna Monts was evaluated in Emergency Department on 05/01/2019 for the symptoms described in the history of present illness. She was evaluated in the context of the global COVID-19 pandemic, which necessitated consideration that the patient might be at risk for infection with the SARS-CoV-2 virus that causes COVID-19. Institutional protocols and algorithms that pertain to the evaluation of patients at risk for COVID-19 are in a state of rapid change based on information released by regulatory bodies including the CDC and federal and state organizations. These policies and algorithms were followed during the patient's care in the ED.  Final Clinical Impression(s) / ED Diagnoses Final diagnoses:  Pneumonia due to COVID-19 virus    Rx / DC Orders ED Discharge Orders    None       Linwood Dibbles, MD 05/01/19 1330

## 2019-05-01 NOTE — Discharge Instructions (Addendum)
Continue your current medications.  Take Tylenol for fever.  Continue with the supplemental vitamin D.  Monitor your breathing and return to the ED as needed for worsening symptoms.

## 2019-05-03 ENCOUNTER — Inpatient Hospital Stay (HOSPITAL_COMMUNITY)
Admission: EM | Admit: 2019-05-03 | Discharge: 2019-06-13 | DRG: 207 | Disposition: E | Payer: Managed Care, Other (non HMO) | Attending: Pulmonary Disease | Admitting: Pulmonary Disease

## 2019-05-03 ENCOUNTER — Other Ambulatory Visit: Payer: Self-pay

## 2019-05-03 ENCOUNTER — Encounter: Payer: Self-pay | Admitting: Primary Care

## 2019-05-03 ENCOUNTER — Emergency Department (HOSPITAL_COMMUNITY): Payer: Managed Care, Other (non HMO)

## 2019-05-03 ENCOUNTER — Ambulatory Visit (INDEPENDENT_AMBULATORY_CARE_PROVIDER_SITE_OTHER): Payer: Managed Care, Other (non HMO) | Admitting: Primary Care

## 2019-05-03 ENCOUNTER — Encounter (HOSPITAL_COMMUNITY): Payer: Self-pay

## 2019-05-03 DIAGNOSIS — I2699 Other pulmonary embolism without acute cor pulmonale: Secondary | ICD-10-CM

## 2019-05-03 DIAGNOSIS — I10 Essential (primary) hypertension: Secondary | ICD-10-CM | POA: Diagnosis present

## 2019-05-03 DIAGNOSIS — J1282 Pneumonia due to coronavirus disease 2019: Secondary | ICD-10-CM | POA: Diagnosis present

## 2019-05-03 DIAGNOSIS — R6520 Severe sepsis without septic shock: Secondary | ICD-10-CM | POA: Diagnosis not present

## 2019-05-03 DIAGNOSIS — I4891 Unspecified atrial fibrillation: Secondary | ICD-10-CM | POA: Diagnosis not present

## 2019-05-03 DIAGNOSIS — E875 Hyperkalemia: Secondary | ICD-10-CM | POA: Diagnosis not present

## 2019-05-03 DIAGNOSIS — R001 Bradycardia, unspecified: Secondary | ICD-10-CM | POA: Diagnosis present

## 2019-05-03 DIAGNOSIS — E785 Hyperlipidemia, unspecified: Secondary | ICD-10-CM | POA: Diagnosis present

## 2019-05-03 DIAGNOSIS — Z66 Do not resuscitate: Secondary | ICD-10-CM | POA: Diagnosis not present

## 2019-05-03 DIAGNOSIS — F329 Major depressive disorder, single episode, unspecified: Secondary | ICD-10-CM | POA: Diagnosis present

## 2019-05-03 DIAGNOSIS — J9601 Acute respiratory failure with hypoxia: Secondary | ICD-10-CM | POA: Diagnosis present

## 2019-05-03 DIAGNOSIS — Z888 Allergy status to other drugs, medicaments and biological substances status: Secondary | ICD-10-CM

## 2019-05-03 DIAGNOSIS — R7401 Elevation of levels of liver transaminase levels: Secondary | ICD-10-CM | POA: Diagnosis not present

## 2019-05-03 DIAGNOSIS — A419 Sepsis, unspecified organism: Secondary | ICD-10-CM | POA: Diagnosis not present

## 2019-05-03 DIAGNOSIS — U071 COVID-19: Secondary | ICD-10-CM | POA: Diagnosis present

## 2019-05-03 DIAGNOSIS — K219 Gastro-esophageal reflux disease without esophagitis: Secondary | ICD-10-CM | POA: Diagnosis present

## 2019-05-03 DIAGNOSIS — J42 Unspecified chronic bronchitis: Secondary | ICD-10-CM | POA: Diagnosis present

## 2019-05-03 DIAGNOSIS — A408 Other streptococcal sepsis: Secondary | ICD-10-CM | POA: Diagnosis not present

## 2019-05-03 DIAGNOSIS — E781 Pure hyperglyceridemia: Secondary | ICD-10-CM | POA: Diagnosis not present

## 2019-05-03 DIAGNOSIS — J80 Acute respiratory distress syndrome: Secondary | ICD-10-CM | POA: Diagnosis not present

## 2019-05-03 DIAGNOSIS — I82432 Acute embolism and thrombosis of left popliteal vein: Secondary | ICD-10-CM | POA: Diagnosis present

## 2019-05-03 DIAGNOSIS — E1165 Type 2 diabetes mellitus with hyperglycemia: Secondary | ICD-10-CM | POA: Diagnosis not present

## 2019-05-03 DIAGNOSIS — E871 Hypo-osmolality and hyponatremia: Secondary | ICD-10-CM | POA: Diagnosis not present

## 2019-05-03 DIAGNOSIS — F419 Anxiety disorder, unspecified: Secondary | ICD-10-CM | POA: Diagnosis present

## 2019-05-03 DIAGNOSIS — T80212A Local infection due to central venous catheter, initial encounter: Secondary | ICD-10-CM

## 2019-05-03 DIAGNOSIS — E669 Obesity, unspecified: Secondary | ICD-10-CM | POA: Diagnosis present

## 2019-05-03 DIAGNOSIS — J15211 Pneumonia due to Methicillin susceptible Staphylococcus aureus: Secondary | ICD-10-CM | POA: Diagnosis not present

## 2019-05-03 DIAGNOSIS — Y95 Nosocomial condition: Secondary | ICD-10-CM | POA: Diagnosis not present

## 2019-05-03 DIAGNOSIS — D649 Anemia, unspecified: Secondary | ICD-10-CM | POA: Diagnosis not present

## 2019-05-03 DIAGNOSIS — R0602 Shortness of breath: Secondary | ICD-10-CM | POA: Diagnosis not present

## 2019-05-03 DIAGNOSIS — I319 Disease of pericardium, unspecified: Secondary | ICD-10-CM

## 2019-05-03 DIAGNOSIS — Z931 Gastrostomy status: Secondary | ICD-10-CM

## 2019-05-03 DIAGNOSIS — E872 Acidosis: Secondary | ICD-10-CM | POA: Diagnosis not present

## 2019-05-03 DIAGNOSIS — Z515 Encounter for palliative care: Secondary | ICD-10-CM | POA: Diagnosis not present

## 2019-05-03 DIAGNOSIS — Z789 Other specified health status: Secondary | ICD-10-CM

## 2019-05-03 DIAGNOSIS — I469 Cardiac arrest, cause unspecified: Secondary | ICD-10-CM | POA: Diagnosis not present

## 2019-05-03 DIAGNOSIS — Z823 Family history of stroke: Secondary | ICD-10-CM

## 2019-05-03 DIAGNOSIS — J939 Pneumothorax, unspecified: Secondary | ICD-10-CM

## 2019-05-03 DIAGNOSIS — J9600 Acute respiratory failure, unspecified whether with hypoxia or hypercapnia: Secondary | ICD-10-CM

## 2019-05-03 DIAGNOSIS — Z833 Family history of diabetes mellitus: Secondary | ICD-10-CM

## 2019-05-03 DIAGNOSIS — Z79899 Other long term (current) drug therapy: Secondary | ICD-10-CM

## 2019-05-03 DIAGNOSIS — J982 Interstitial emphysema: Secondary | ICD-10-CM | POA: Diagnosis not present

## 2019-05-03 DIAGNOSIS — E119 Type 2 diabetes mellitus without complications: Secondary | ICD-10-CM

## 2019-05-03 DIAGNOSIS — Z7984 Long term (current) use of oral hypoglycemic drugs: Secondary | ICD-10-CM

## 2019-05-03 DIAGNOSIS — D696 Thrombocytopenia, unspecified: Secondary | ICD-10-CM | POA: Diagnosis not present

## 2019-05-03 DIAGNOSIS — Z7951 Long term (current) use of inhaled steroids: Secondary | ICD-10-CM

## 2019-05-03 DIAGNOSIS — Z882 Allergy status to sulfonamides status: Secondary | ICD-10-CM

## 2019-05-03 DIAGNOSIS — Z9049 Acquired absence of other specified parts of digestive tract: Secondary | ICD-10-CM

## 2019-05-03 DIAGNOSIS — Z8261 Family history of arthritis: Secondary | ICD-10-CM

## 2019-05-03 DIAGNOSIS — Z8249 Family history of ischemic heart disease and other diseases of the circulatory system: Secondary | ICD-10-CM

## 2019-05-03 DIAGNOSIS — J45909 Unspecified asthma, uncomplicated: Secondary | ICD-10-CM | POA: Diagnosis present

## 2019-05-03 DIAGNOSIS — F411 Generalized anxiety disorder: Secondary | ICD-10-CM | POA: Diagnosis present

## 2019-05-03 DIAGNOSIS — Z79890 Hormone replacement therapy (postmenopausal): Secondary | ICD-10-CM

## 2019-05-03 DIAGNOSIS — Z6841 Body Mass Index (BMI) 40.0 and over, adult: Secondary | ICD-10-CM | POA: Diagnosis not present

## 2019-05-03 LAB — COMPREHENSIVE METABOLIC PANEL
ALT: 28 U/L (ref 0–44)
AST: 36 U/L (ref 15–41)
Albumin: 3 g/dL — ABNORMAL LOW (ref 3.5–5.0)
Alkaline Phosphatase: 103 U/L (ref 38–126)
Anion gap: 12 (ref 5–15)
BUN: 8 mg/dL (ref 6–20)
CO2: 24 mmol/L (ref 22–32)
Calcium: 8.6 mg/dL — ABNORMAL LOW (ref 8.9–10.3)
Chloride: 100 mmol/L (ref 98–111)
Creatinine, Ser: 0.69 mg/dL (ref 0.44–1.00)
GFR calc Af Amer: >60 mL/min (ref >60)
GFR calc non Af Amer: >60 mL/min (ref >60)
Glucose, Bld: 164 mg/dL — ABNORMAL HIGH (ref 70–99)
Potassium: 3.5 mmol/L (ref 3.5–5.1)
Sodium: 136 mmol/L (ref 135–145)
Total Bilirubin: 0.4 mg/dL (ref 0.3–1.2)
Total Protein: 7.5 g/dL (ref 6.5–8.1)

## 2019-05-03 LAB — CBC WITH DIFFERENTIAL/PLATELET
Abs Immature Granulocytes: 0.03 10*3/uL (ref 0.00–0.07)
Basophils Absolute: 0 10*3/uL (ref 0.0–0.1)
Basophils Relative: 0 %
Eosinophils Absolute: 0 10*3/uL (ref 0.0–0.5)
Eosinophils Relative: 0 %
HCT: 40.2 % (ref 36.0–46.0)
Hemoglobin: 12.6 g/dL (ref 12.0–15.0)
Immature Granulocytes: 0 %
Lymphocytes Relative: 21 %
Lymphs Abs: 1.4 10*3/uL (ref 0.7–4.0)
MCH: 27 pg (ref 26.0–34.0)
MCHC: 31.3 g/dL (ref 30.0–36.0)
MCV: 86.1 fL (ref 80.0–100.0)
Monocytes Absolute: 0.1 10*3/uL (ref 0.1–1.0)
Monocytes Relative: 1 %
Neutro Abs: 5.2 10*3/uL (ref 1.7–7.7)
Neutrophils Relative %: 78 %
Platelets: 214 10*3/uL (ref 150–400)
RBC: 4.67 MIL/uL (ref 3.87–5.11)
RDW: 13.8 % (ref 11.5–15.5)
WBC: 6.8 10*3/uL (ref 4.0–10.5)
nRBC: 0 % (ref 0.0–0.2)

## 2019-05-03 LAB — LACTIC ACID, PLASMA
Lactic Acid, Venous: 2.1 mmol/L (ref 0.5–1.9)
Lactic Acid, Venous: 1.7 mmol/L (ref 0.5–1.9)

## 2019-05-03 LAB — HCG, QUANTITATIVE, PREGNANCY: hCG, Beta Chain, Quant, S: <1 m[IU]/mL (ref <5)

## 2019-05-03 LAB — PROCALCITONIN: Procalcitonin: <0.1 ng/mL

## 2019-05-03 LAB — TRIGLYCERIDES: Triglycerides: 676 mg/dL — ABNORMAL HIGH (ref <150)

## 2019-05-03 LAB — CBG MONITORING, ED: Glucose-Capillary: 255 mg/dL — ABNORMAL HIGH (ref 70–99)

## 2019-05-03 LAB — FIBRINOGEN: Fibrinogen: >800 mg/dL — ABNORMAL HIGH (ref 210–475)

## 2019-05-03 LAB — D-DIMER, QUANTITATIVE: D-Dimer, Quant: 0.65 ug/mL-FEU — ABNORMAL HIGH (ref 0.00–0.50)

## 2019-05-03 LAB — LACTATE DEHYDROGENASE: LDH: 277 U/L — ABNORMAL HIGH (ref 98–192)

## 2019-05-03 LAB — FERRITIN: Ferritin: 70 ng/mL (ref 11–307)

## 2019-05-03 LAB — C-REACTIVE PROTEIN: CRP: 19.9 mg/dL — ABNORMAL HIGH (ref <1.0)

## 2019-05-03 MED ORDER — MOMETASONE FURO-FORMOTEROL FUM 200-5 MCG/ACT IN AERO
2.0000 | INHALATION_SPRAY | Freq: Two times a day (BID) | RESPIRATORY_TRACT | Status: DC
  Administered 2019-05-03 – 2019-05-05 (×4): 2 via RESPIRATORY_TRACT
  Filled 2019-05-03: qty 8.8

## 2019-05-03 MED ORDER — HYDROCOD POLST-CPM POLST ER 10-8 MG/5ML PO SUER
5.0000 mL | Freq: Two times a day (BID) | ORAL | Status: DC | PRN
  Administered 2019-05-04: 5 mL via ORAL
  Filled 2019-05-03: qty 5

## 2019-05-03 MED ORDER — ALBUTEROL SULFATE HFA 108 (90 BASE) MCG/ACT IN AERS
4.0000 | INHALATION_SPRAY | Freq: Once | RESPIRATORY_TRACT | Status: AC
  Administered 2019-05-03: 19:00:00 4 via RESPIRATORY_TRACT
  Filled 2019-05-03: qty 6.7

## 2019-05-03 MED ORDER — ALBUTEROL SULFATE HFA 108 (90 BASE) MCG/ACT IN AERS
2.0000 | INHALATION_SPRAY | Freq: Four times a day (QID) | RESPIRATORY_TRACT | Status: DC | PRN
  Administered 2019-05-04: 2 via RESPIRATORY_TRACT
  Filled 2019-05-03: qty 6.7

## 2019-05-03 MED ORDER — INSULIN ASPART 100 UNIT/ML ~~LOC~~ SOLN
0.0000 [IU] | Freq: Every day | SUBCUTANEOUS | Status: DC
  Administered 2019-05-03: 3 [IU] via SUBCUTANEOUS
  Filled 2019-05-03: qty 0.05

## 2019-05-03 MED ORDER — ATORVASTATIN CALCIUM 10 MG PO TABS
20.0000 mg | ORAL_TABLET | Freq: Every day | ORAL | Status: DC
  Administered 2019-05-04 – 2019-05-05 (×2): 20 mg via ORAL
  Filled 2019-05-03 (×2): qty 2

## 2019-05-03 MED ORDER — SERTRALINE HCL 50 MG PO TABS
50.0000 mg | ORAL_TABLET | Freq: Every day | ORAL | Status: DC
  Administered 2019-05-04 – 2019-05-05 (×2): 50 mg via ORAL
  Filled 2019-05-03 (×2): qty 1

## 2019-05-03 MED ORDER — GUAIFENESIN-DM 100-10 MG/5ML PO SYRP: 10.0000 mL | ORAL_SOLUTION | ORAL | Status: DC | PRN

## 2019-05-03 MED ORDER — ONDANSETRON HCL 4 MG PO TABS: 4.0000 mg | ORAL_TABLET | Freq: Four times a day (QID) | ORAL | Status: DC | PRN

## 2019-05-03 MED ORDER — NEBIVOLOL HCL 10 MG PO TABS
10.0000 mg | ORAL_TABLET | Freq: Every day | ORAL | Status: DC
  Filled 2019-05-03 (×2): qty 1

## 2019-05-03 MED ORDER — INSULIN ASPART 100 UNIT/ML ~~LOC~~ SOLN
0.0000 [IU] | Freq: Three times a day (TID) | SUBCUTANEOUS | Status: DC
  Administered 2019-05-04: 3 [IU] via SUBCUTANEOUS
  Administered 2019-05-04 (×2): 2 [IU] via SUBCUTANEOUS
  Administered 2019-05-05 (×2): 3 [IU] via SUBCUTANEOUS
  Filled 2019-05-03: qty 0.15

## 2019-05-03 MED ORDER — DEXAMETHASONE SODIUM PHOSPHATE 10 MG/ML IJ SOLN: 6.0000 mg | INTRAMUSCULAR | Status: DC

## 2019-05-03 MED ORDER — ENOXAPARIN SODIUM 80 MG/0.8ML ~~LOC~~ SOLN
65.0000 mg | SUBCUTANEOUS | Status: DC
  Administered 2019-05-03 – 2019-05-04 (×2): 65 mg via SUBCUTANEOUS
  Filled 2019-05-03: qty 0.8
  Filled 2019-05-03: qty 0.65

## 2019-05-03 MED ORDER — ENOXAPARIN SODIUM 40 MG/0.4ML ~~LOC~~ SOLN: 40.0000 mg | SUBCUTANEOUS | Status: DC

## 2019-05-03 MED ORDER — ACETAMINOPHEN 325 MG PO TABS: 650.0000 mg | ORAL_TABLET | Freq: Four times a day (QID) | ORAL | Status: DC | PRN

## 2019-05-03 MED ORDER — SODIUM CHLORIDE 0.9 % IV SOLN
200.0000 mg | Freq: Once | INTRAVENOUS | Status: AC
  Administered 2019-05-03: 200 mg via INTRAVENOUS
  Filled 2019-05-03: qty 200

## 2019-05-03 MED ORDER — LIP MEDEX EX OINT
TOPICAL_OINTMENT | Freq: Once | CUTANEOUS | Status: AC
  Filled 2019-05-03: qty 7

## 2019-05-03 MED ORDER — ONDANSETRON HCL 4 MG/2ML IJ SOLN
4.0000 mg | Freq: Four times a day (QID) | INTRAMUSCULAR | Status: DC | PRN
  Administered 2019-05-04: 11:00:00 4 mg via INTRAVENOUS
  Filled 2019-05-03: qty 2

## 2019-05-03 MED ORDER — SODIUM CHLORIDE 0.9 % IV SOLN
100.0000 mg | Freq: Every day | INTRAVENOUS | Status: AC
  Administered 2019-05-04 – 2019-05-07 (×4): 100 mg via INTRAVENOUS
  Filled 2019-05-03 (×5): qty 20

## 2019-05-03 MED ORDER — MONTELUKAST SODIUM 10 MG PO TABS
10.0000 mg | ORAL_TABLET | Freq: Every day | ORAL | Status: DC
  Administered 2019-05-03 – 2019-05-04 (×2): 10 mg via ORAL
  Filled 2019-05-03 (×3): qty 1

## 2019-05-03 MED ORDER — DEXAMETHASONE SODIUM PHOSPHATE 10 MG/ML IJ SOLN
10.0000 mg | Freq: Once | INTRAMUSCULAR | Status: AC
  Administered 2019-05-03: 19:00:00 10 mg via INTRAVENOUS
  Filled 2019-05-03: qty 1

## 2019-05-03 MED ORDER — LOSARTAN POTASSIUM 50 MG PO TABS
50.0000 mg | ORAL_TABLET | Freq: Every day | ORAL | Status: DC
  Administered 2019-05-04 – 2019-05-05 (×2): 50 mg via ORAL
  Filled 2019-05-03: qty 2
  Filled 2019-05-03: qty 1

## 2019-05-03 MED ORDER — TOCILIZUMAB 400 MG/20ML IV SOLN
800.0000 mg | Freq: Once | INTRAVENOUS | Status: AC
  Administered 2019-05-03: 21:00:00 800 mg via INTRAVENOUS
  Filled 2019-05-03: qty 40

## 2019-05-03 NOTE — ED Provider Notes (Signed)
Emergency Department Provider Note   I have reviewed the triage vital signs and the nursing notes.   HISTORY  Chief Complaint Covid +, Shortness of Breath, Cough, and Fever   HPI [name] is a 44 y.o. female with known COVID 19 infection diagnosed on 1/12 returns to the emergency department now with worsening SOB and wheezing with a low oxygen at home.  Patient has been seen in the emergency department previously but did not have hypoxemia.  She completed a 5-day course of 40 mg prednisone 1 day ago.  She has been using her albuterol inhaler and nebulizers 4 times per day with no lasting relief.  She describes fever with body aches.  She has chest pain mainly with coughing and occasionally with deep breathing but nothing consistent.  Denies chest pain at rest.  She states her entire family has COVID-19.   Past Medical History:  Diagnosis Date  . Acne   . Allergic rhinitis   . Anxiety   . Asthma   . Blood in stool   . Chronic bronchitis   . Dyspnea    At times  . Dysrhythmia    Tachycardia on bystolic for  . GERD (gastroesophageal reflux disease)   . HLD (hyperlipidemia)   . Pneumonia    hx of  . Pre-diabetes    On metformin preventively  . Seasonal allergies     Patient Active Problem List   Diagnosis Date Noted  . Acute respiratory distress syndrome (ARDS) due to COVID-19 virus (HCC) 2019-05-04  . Acute respiratory failure with hypoxia (HCC) 05-04-19  . Abdominal pain 04/25/2019  . COVID-19 virus infection 04/25/2019  . GAD (generalized anxiety disorder) 02/09/2019  . Essential hypertension 01/25/2019  . Controlled type 2 diabetes mellitus without complication, without Idalis Hoelting-term current use of insulin (HCC) 01/06/2017  . Preventative health care 11/01/2014  . History of anal fissures 11/01/2014  . Elevated TSH 11/01/2014  . Insomnia 08/12/2013  . Obesity 02/27/2011  . Palpitations 12/25/2010  . HLD (hyperlipidemia) 12/15/2006  . Allergic rhinitis  12/15/2006  . Asthma 12/15/2006  . GERD 12/15/2006    Past Surgical History:  Procedure Laterality Date  . ANAL FISSURE REPAIR N/A 05/23/2016   Procedure: Lateral Internal Sphincterotomy and Excision of Internal Tag;  Surgeon: Avel Peace, MD;  Location: [facility];  Service: General;  Laterality: N/A;  . CHOLECYSTECTOMY    . CYST REMOVAL TRUNK     Bottom of spine  . HEMORROIDECTOMY    . TONSILLECTOMY      Allergies Levalbuterol, Buspar [buspirone hcl], Sulfamethoxazole, and Sulfonamide derivatives  Family History  Problem Relation Age of Onset  . Heart attack Father   . Arthritis Other        Family hx  . Diabetes Other        1st degree relative  . Hypertension Other        Family hx  . Stroke Other        1st degree relative < 50  . Other Other        Cardiovascular disorder--Family hx    Social History Social History   Tobacco Use  . Smoking status: Never Smoker  . Smokeless tobacco: Never Used  Substance Use Topics  . Alcohol use: No    Alcohol/week: 0.0 standard drinks  . Drug use: No    Review of Systems  Constitutional: Positive fever/chills and body aches.  Eyes: No visual changes. ENT: No sore throat. Cardiovascular: Intermittent chest pain. Respiratory: Positive  shortness of breath and cough.  Gastrointestinal: No abdominal pain.  No nausea, no vomiting.  No diarrhea.  No constipation. Genitourinary: Negative for dysuria. Musculoskeletal: Negative for back pain. Skin: Negative for rash. Neurological: Negative for focal weakness or numbness. Positive HA.   10-point ROS otherwise negative.  ____________________________________________   PHYSICAL EXAM:  VITAL SIGNS: ED Triage Vitals  Enc Vitals Group     BP 05-15-2019 1753 (!) 143/74     Pulse Rate May 15, 2019 1753 81     Resp 2019-05-15 1753 (!) 24     Temp 2019/05/15 1753 98.3 F (36.8 C)     Temp src --      SpO2 05/15/2019 1753 (!) 81 %   Constitutional: Alert and oriented. Well appearing  and able to provide a history but does have increased respiratory rate.  Eyes: Conjunctivae are normal.  Head: Atraumatic. Nose: No congestion/rhinnorhea. Mouth/Throat: Mucous membranes are moist.   Neck: No stridor.  Cardiovascular: Normal rate, regular rhythm. Good peripheral circulation. Grossly normal heart sounds.   Respiratory: Increased respiratory effort.  No retractions. Lungs with faint end-expiratory wheezing. No rales or rhonchi.  Gastrointestinal: Soft and nontender. No distention.  Musculoskeletal: No lower extremity tenderness nor edema. No gross deformities of extremities. Neurologic:  Normal speech and language.  Skin:  Skin is warm, dry and intact. No rash noted.  ____________________________________________   LABS (all labs ordered are listed, but only abnormal results are displayed)  Labs Reviewed  LACTIC ACID, PLASMA - Abnormal; Notable for the following components:      Result Value   Lactic Acid, Venous 2.1 (*)    All other components within normal limits  COMPREHENSIVE METABOLIC PANEL - Abnormal; Notable for the following components:   Glucose, Bld 164 (*)    Calcium 8.6 (*)    Albumin 3.0 (*)    All other components within normal limits  D-DIMER, QUANTITATIVE (NOT AT [facility]) - Abnormal; Notable for the following components:   D-Dimer, Quant 0.65 (*)    All other components within normal limits  LACTATE DEHYDROGENASE - Abnormal; Notable for the following components:   LDH 277 (*)    All other components within normal limits  TRIGLYCERIDES - Abnormal; Notable for the following components:   Triglycerides 676 (*)    All other components within normal limits  FIBRINOGEN - Abnormal; Notable for the following components:   Fibrinogen >800 (*)    All other components within normal limits  C-REACTIVE PROTEIN - Abnormal; Notable for the following components:   CRP 19.9 (*)    All other components within normal limits  CBG MONITORING, ED - Abnormal; Notable for  the following components:   Glucose-Capillary 255 (*)    All other components within normal limits  CULTURE, BLOOD (ROUTINE X 2)  CULTURE, BLOOD (ROUTINE X 2)  RESPIRATORY PANEL BY PCR  LACTIC ACID, PLASMA  CBC WITH DIFFERENTIAL/PLATELET  PROCALCITONIN  FERRITIN  HCG, QUANTITATIVE, PREGNANCY  HEMOGLOBIN A1C  HIV ANTIBODY (ROUTINE TESTING W REFLEX)  CBC WITH DIFFERENTIAL/PLATELET  COMPREHENSIVE METABOLIC PANEL  C-REACTIVE PROTEIN  D-DIMER, QUANTITATIVE (NOT AT [facility])  ABO/RH   ____________________________________________  EKG   EKG Interpretation  Date/Time:  2019-05-15 18:05:46 EST Ventricular Rate:  82 PR Interval:    QRS Duration: 93 QT Interval:  380 QTC Calculation: 444 R Axis:   20 Text Interpretation: Sinus rhythm RSR' in V1 or V2, probably normal variant Borderline T abnormalities, inferior leads No STEMI Confirmed by Nanda Quinton [phone]) on  05/08/2019 6:13:41 PM       ____________________________________________  RADIOLOGY  DG Chest Port 1 View  Result Date: 05/07/2019 CLINICAL DATA:  Worsening shortness of breath. EXAM: PORTABLE CHEST 1 VIEW COMPARISON:  May 01, 2019 FINDINGS: Moderate severity diffuse bilateral infiltrates are seen. This is increased in severity when compared to the prior study. There is no evidence of a pleural effusion or pneumothorax. The heart size and mediastinal contours are within normal limits. The visualized skeletal structures are unremarkable. IMPRESSION: 1. Moderate severity diffuse bilateral infiltrates, increased in severity when compared to the prior chest plain film dated May 01, 2019. Electronically Signed   By: Aram Candela M.D.   On: 05/08/2019 18:28    ____________________________________________   PROCEDURES  Procedure(s) performed:   Procedures  CRITICAL CARE Performed by: Maia Plan Total critical care time: 35 minutes Critical care time was exclusive of separately billable  procedures and treating other patients. Critical care was necessary to treat or prevent imminent or life-threatening deterioration. Critical care was time spent personally by me on the following activities: development of treatment plan with patient and/or surrogate as well as nursing, discussions with consultants, evaluation of patient's response to treatment, examination of patient, obtaining history from patient or surrogate, ordering and performing treatments and interventions, ordering and review of laboratory studies, ordering and review of radiographic studies, pulse oximetry and re-evaluation of patient's condition.  Alona Bene, MD Emergency Medicine  ____________________________________________   INITIAL IMPRESSION / ASSESSMENT AND PLAN / ED COURSE  Pertinent labs & imaging results that were available during my care of the patient were reviewed by me and considered in my medical decision making (see chart for details).   Patient presents to the emergency department for evaluation of worsening shortness of breath with known COVID-19 infection.  Patient with tachypnea and mild wheezing.  Patient is moving reasonably good air and doubt that asthma is playing a significant role here.  I suspect worsening Covid pneumonia is the primary cause for her now hypoxemia and shortness of breath.  Lower suspicion clinically for PE. Will follow labs, give decadron, and MDI here in the ED.   WOB not worsening here in the ED. Imaging and labs reviewed. CXR with worsening infiltrate.   Discussed patient's case with [facility], Dr. Julian Reil to request admission. Patient and family (if present) updated with plan. Care transferred to [facility] service.  I reviewed all nursing notes, vitals, pertinent old records, EKGs, labs, imaging (as available).  ____________________________________________  FINAL CLINICAL IMPRESSION(S) / ED DIAGNOSES  Final diagnoses:  Acute respiratory failure with hypoxia (HCC)  COVID-19      MEDICATIONS GIVEN DURING THIS VISIT:  Medications  remdesivir 200 mg in sodium chloride 0.9% 250 mL IVPB (0 mg Intravenous Stopped 05/02/2019 2007)    Followed by  remdesivir 100 mg in sodium chloride 0.9 % 100 mL IVPB (has no administration in time range)  nebivolol (BYSTOLIC) tablet 10 mg (has no administration in time range)  sertraline (ZOLOFT) tablet 50 mg (has no administration in time range)  montelukast (SINGULAIR) tablet 10 mg (10 mg Oral Given 04/24/2019 2118)  mometasone-formoterol (DULERA) 200-5 MCG/ACT inhaler 2 puff (2 puffs Inhalation Given 05/11/2019 2113)  losartan (COZAAR) tablet 50 mg (has no administration in time range)  atorvastatin (LIPITOR) tablet 20 mg (has no administration in time range)  albuterol (VENTOLIN HFA) 108 (90 Base) MCG/ACT inhaler 2 puff (has no administration in time range)  insulin aspart (novoLOG) injection 0-5 Units (3 Units Subcutaneous Given 05/12/2019  2144)  insulin aspart (novoLOG) injection 0-15 Units (has no administration in time range)  dexamethasone (DECADRON) injection 6 mg (has no administration in time range)  guaiFENesin-dextromethorphan (ROBITUSSIN DM) 100-10 MG/5ML syrup 10 mL (has no administration in time range)  chlorpheniramine-HYDROcodone (TUSSIONEX) 10-8 MG/5ML suspension 5 mL (has no administration in time range)  acetaminophen (TYLENOL) tablet 650 mg (has no administration in time range)  ondansetron (ZOFRAN) tablet 4 mg (has no administration in time range)    Or  ondansetron (ZOFRAN) injection 4 mg (has no administration in time range)  enoxaparin (LOVENOX) injection 65 mg (65 mg Subcutaneous Given 05/25/2019 2142)  albuterol (VENTOLIN HFA) 108 (90 Base) MCG/ACT inhaler 4 puff (4 puffs Inhalation Given 2019/05/25 1839)  dexamethasone (DECADRON) injection 10 mg (10 mg Intravenous Given 25-May-2019 1838)  lip balm (CARMEX) ointment ( Topical Given 2019-05-25 1937)  tocilizumab (ACTEMRA) 800 mg in sodium chloride 0.9 % 100 mL infusion (0 mg  Intravenous Stopped 05-25-19 2214)    Note:  This document was prepared using Dragon voice recognition software and may include unintentional dictation errors.  Alona Bene, MD, [facility] Emergency Medicine    Monnie Gudgel, Arlyss Repress, MD 05/04/19 [phone]

## 2019-05-03 NOTE — ED Triage Notes (Signed)
EMS reports from home, Covid + 1/12.. Seen at [facility] Sunday for SOB Dx with PNA. EMS reports SOB and Sp02 72 on scene, non rebreather placed @ 10 Ltrs Sp02 up to 96. Given Neb enroute without result.  BP 116/56 HR 80 Sp02 on Non Reb 96 RR24 Temp 97.2

## 2019-05-03 NOTE — Patient Instructions (Signed)
Go to the hospital now as discussed.   It was a pleasure to see you today! Mayra Reel, NP-C

## 2019-05-03 NOTE — Progress Notes (Signed)
ANTICOAGULATION CONSULT NOTE - Initial Consult  Pharmacy Consult for Lovenox Indication: VTE prophylaxis with + Covid  Allergies  Allergen Reactions  . Levalbuterol Shortness Of Breath  . Buspar [Buspirone Hcl]      Fainting feelings   . Sulfamethoxazole     REACTION: unspecified  . Sulfonamide Derivatives     REACTION: rash    Patient Measurements: Weight: 280 lb (127 kg)   Vital Signs: Temp: 98.3 F (36.8 C) (01/19 1753) Temp Source: Oral (01/19 1521) BP: 146/81 (01/19 2000) Pulse Rate: 87 (01/19 2000)  Labs: Recent Labs    05/01/19 1055 05/01/19 1144 05/14/2019 1842  HGB  --  12.5 12.6  HCT  --  38.8 40.2  PLT  --  191 214  CREATININE 0.75  --  0.69  TROPONINIHS 4  --   --     Estimated Creatinine Clearance: 116.2 mL/min (by C-G formula based on SCr of 0.69 mg/dL).   Medical History: Past Medical History:  Diagnosis Date  . Acne   . Allergic rhinitis   . Anxiety   . Asthma   . Blood in stool   . Chronic bronchitis   . Dyspnea    At times  . Dysrhythmia    Tachycardia on bystolic for  . GERD (gastroesophageal reflux disease)   . HLD (hyperlipidemia)   . Pneumonia    hx of  . Pre-diabetes    On metformin preventively  . Seasonal allergies     Medications:  No oral anticoagulation PTA  Assessment:  44 yr female diagnosed with Covid on 1/12.  Presents to ED with worsening shortness of breath.    Pharmacy consulted to dose Lovenox for VTE prophylaxis  D-Dimer = 0.65  BMI = 50.8  CrCl > 116 ml/min  Goal of Therapy:  Prophylactic anticoagulation coverage    Plan:   Lovenox 65mg  sq q24h (0.5 mg/kg/q24h)  Monitor clinical outcomes and adjust dose as needed  Jamesmichael Shadd, , PharmD 04/23/2019,8:36 PM

## 2019-05-03 NOTE — Assessment & Plan Note (Signed)
With hypoxia. Diagnosed January 12th. Covid-19 viral infection with complications of acute hypoxia and dyspnea. Also (likely) viral pneumonia. Given room air oxygen saturation readings (hypoxia) coupled with minimal exertional dyspnea, weakness I recommended she go to the nearest ED now. She needs supplemental oxygen, repeat imaging, labs, perhaps IV treatment for Covid-19.  She initially refused but has changed her mind. Her husband will take her to [facility] ED. Report called to Triage nurse by medical assistant.   Hospital notes, labs, imaging reviewed from ED visit on 05/01/19.

## 2019-05-03 NOTE — ED Notes (Signed)
Per PCP-states was seen in ED for covid symptoms-discharged-states increased lethargy and low O2-coming to ED for further eval

## 2019-05-03 NOTE — H&P (Addendum)
History and Physical    [name] EGB:[id number] DOB: 29-May-1975 DOA: 04/21/2019  PCP: Doreene Nest, NP  Patient coming from: HOME  I have personally briefly reviewed patient's old medical records in [facility]  Chief Complaint: COVID, SOB  HPI: [name] is a 44 y.o. female with medical history significant of pre-DM, obesity, HTN.  Patient tested positive for COVID on 1/12.  Whole family has COVID per patient.  Worsening SOB, seen at [facility] on Sunday 1/17.  Dc home at that time.  Today lethargic at home, EMS called.  O2 sats were in the 70s on RA.  Brought in on 10L which brought O2 up to 96.   ED Course: They weaned her down to 6L currently though O2 sat now 87% on that.  CXR shows diffuse B infiltrates c/w ARDS.  WBC NL, procalcitonin neg, d.dimer 0.65.  CRP 19.   Review of Systems: As per HPI, otherwise all review of systems negative.  Past Medical History:  Diagnosis Date  . Acne   . Allergic rhinitis   . Anxiety   . Asthma   . Blood in stool   . Chronic bronchitis   . Dyspnea    At times  . Dysrhythmia    Tachycardia on bystolic for  . GERD (gastroesophageal reflux disease)   . HLD (hyperlipidemia)   . Pneumonia    hx of  . Pre-diabetes    On metformin preventively  . Seasonal allergies     Past Surgical History:  Procedure Laterality Date  . ANAL FISSURE REPAIR N/A 05/23/2016   Procedure: Lateral Internal Sphincterotomy and Excision of Internal Tag;  Surgeon: Avel Peace, MD;  Location: [facility];  Service: General;  Laterality: N/A;  . CHOLECYSTECTOMY    . CYST REMOVAL TRUNK     Bottom of spine  . HEMORROIDECTOMY    . TONSILLECTOMY       reports that she has never smoked. She has never used smokeless tobacco. She reports that she does not drink alcohol or use drugs.  Allergies  Allergen Reactions  . Levalbuterol Shortness Of Breath  . Buspar [Buspirone Hcl]      Fainting feelings   . Sulfamethoxazole     REACTION:  unspecified  . Sulfonamide Derivatives     REACTION: rash    Family History  Problem Relation Age of Onset  . Heart attack Father   . Arthritis Other        Family hx  . Diabetes Other        1st degree relative  . Hypertension Other        Family hx  . Stroke Other        1st degree relative < 50  . Other Other        Cardiovascular disorder--Family hx     Prior to Admission medications   Medication Sig Start Date End Date Taking? Authorizing Provider  albuterol (PROVENTIL) (2.5 MG/3ML) 0.083% nebulizer solution INHALE CONTENTS OF 1 VIAL VIA NEBULIZER EVERY 6 HOURS AS NEEDED FOR WHEEZING Patient taking differently: Take 2.5 mg by nebulization every 6 (six) hours as needed for wheezing or shortness of breath.  04/29/19  Yes Doreene Nest, NP  albuterol (VENTOLIN HFA) 108 (90 Base) MCG/ACT inhaler INHALE 2 PUFFS INTO THE LUNGS EVERY 6 HOURS AS NEEDED FOR WHEEZING OR SHORTNESS OF BREATH Patient taking differently: Inhale 2 puffs into the lungs every 6 (six) hours as needed for wheezing or shortness of breath.  03/24/19  Yes Doreene Nest, NP  atorvastatin (LIPITOR) 20 MG tablet TAKE 1 TABLET BY MOUTH DAILY FOR CHOLESTEROL Patient taking differently: Take 20 mg by mouth daily.  03/22/19  Yes Doreene Nest, NP  cetirizine (ZYRTEC) 10 MG tablet Take 10 mg by mouth daily.     Yes [provider]  losartan (COZAAR) 50 MG tablet Take 1 tablet (50 mg total) by mouth daily. For blood pressure. 03/23/19  Yes Doreene Nest, NP  MELATONIN PO Take 6 mg by mouth at bedtime as needed (sleep aid).    Yes [provider]  metFORMIN (GLUCOPHAGE) 500 MG tablet TAKE 1 TABLET(500 MG) BY MOUTH TWICE DAILY WITH A MEAL Patient taking differently: Take 500 mg by mouth 2 (two) times daily with a meal.  01/03/19  Yes Doreene Nest, NP  mometasone-formoterol (DULERA) 200-5 MCG/ACT AERO INHALE 1-2 PUFFS BY MOUTH twice daily. 04/19/19  Yes Doreene Nest, NP  montelukast  (SINGULAIR) 10 MG tablet TAKE 1 TABLET BY MOUTH EVERY NIGHT AT BEDTIME Patient taking differently: Take 10 mg by mouth at bedtime.  01/31/19  Yes Doreene Nest, NP  nebivolol (BYSTOLIC) 10 MG tablet Take 1 tablet (10 mg total) by mouth daily. 01/03/19  Yes Doreene Nest, NP  sertraline (ZOLOFT) 50 MG tablet Take 1 tablet (50 mg total) by mouth daily. For anxiety. 02/09/19  Yes Doreene Nest, NP  SYEDA 3-0.03 MG tablet TAKE 1 TABLET BY MOUTH DAILY Patient taking differently: Take 1 tablet by mouth daily.  02/10/19  Yes Doreene Nest, NP    Physical Exam: Vitals:   05/14/19 1830 14-May-2019 1900 May 14, 2019 1902 05-14-2019 2000  BP: (!) 144/78 (!) 124/100 (!) 124/100 (!) 146/81  Pulse: 84 84 85 87  Resp: (!) 35 (!) 24 (!) 21 (!) 27  Temp:      SpO2: 94% (!) 89% 90% (!) 89%    Constitutional: NAD, calm, comfortable Eyes: PERRL, lids and conjunctivae normal ENMT: Mucous membranes are moist. Posterior pharynx clear of any exudate or lesions.Normal dentition.  Neck: normal, supple, no masses, no thyromegaly Respiratory: clear to auscultation bilaterally, no wheezing, no crackles. Normal respiratory effort. No accessory muscle use.  Cardiovascular: Regular rate and rhythm, no murmurs / rubs / gallops. No extremity edema. 2+ pedal pulses. No carotid bruits.  Abdomen: no tenderness, no masses palpated. No hepatosplenomegaly. Bowel sounds positive.  Musculoskeletal: no clubbing / cyanosis. No joint deformity upper and lower extremities. Good ROM, no contractures. Normal muscle tone.  Skin: no rashes, lesions, ulcers. No induration Neurologic: CN 2-12 grossly intact. Sensation intact, DTR normal. Strength 5/5 in all 4.  Psychiatric: Normal judgment and insight. Alert and oriented x 3. Normal mood.    Labs on Admission: I have personally reviewed following labs and imaging studies  CBC: Recent Labs  Lab 05/01/19 1144 2019-05-14 1842  WBC 6.6 6.8  NEUTROABS  --  5.2  HGB 12.5 12.6   HCT 38.8 40.2  MCV 86.2 86.1  PLT 191 214   Basic Metabolic Panel: Recent Labs  Lab 05/01/19 1055 14-May-2019 1842  NA 135 136  K 4.3 3.5  CL 103 100  CO2 19* 24  GLUCOSE 162* 164*  BUN 9 8  CREATININE 0.75 0.69  CALCIUM 8.7* 8.6*   GFR: CrCl cannot be calculated (Unknown ideal weight.). Liver Function Tests: Recent Labs  Lab 05/14/19 1842  AST 36  ALT 28  ALKPHOS 103  BILITOT 0.4  PROT 7.5  ALBUMIN 3.0*  No results for input(s): LIPASE, AMYLASE in the last 168 hours. No results for input(s): AMMONIA in the last 168 hours. Coagulation Profile: No results for input(s): INR, PROTIME in the last 168 hours. Cardiac Enzymes: No results for input(s): CKTOTAL, CKMB, CKMBINDEX, TROPONINI in the last 168 hours. BNP (last 3 results) No results for input(s): PROBNP in the last 8760 hours. HbA1C: No results for input(s): HGBA1C in the last 72 hours. CBG: No results for input(s): GLUCAP in the last 168 hours. Lipid Profile: Recent Labs    04/28/2019 1842  TRIG 676*   Thyroid Function Tests: No results for input(s): TSH, T4TOTAL, FREET4, T3FREE, THYROIDAB in the last 72 hours. Anemia Panel: Recent Labs    04/27/2019 1842  FERRITIN 70   Urine analysis:    Component Value Date/Time   COLORURINE STRAW (A) 04/08/2019 1137   APPEARANCEUR HAZY (A) 04/08/2019 1137   LABSPEC 1.003 (L) 04/08/2019 1137   PHURINE 6.0 04/08/2019 1137   GLUCOSEU NEGATIVE 04/08/2019 1137   HGBUR NEGATIVE 04/08/2019 1137   BILIRUBINUR NEGATIVE 04/08/2019 1137   BILIRUBINUR Negative 08/24/2018 0836   KETONESUR NEGATIVE 04/08/2019 1137   PROTEINUR NEGATIVE 04/08/2019 1137   UROBILINOGEN 0.2 08/24/2018 0836   NITRITE NEGATIVE 04/08/2019 1137   LEUKOCYTESUR TRACE (A) 04/08/2019 1137    Radiological Exams on Admission: DG Chest Port 1 View  Result Date: 04/28/2019 CLINICAL DATA:  Worsening shortness of breath. EXAM: PORTABLE CHEST 1 VIEW COMPARISON:  May 01, 2019 FINDINGS: Moderate  severity diffuse bilateral infiltrates are seen. This is increased in severity when compared to the prior study. There is no evidence of a pleural effusion or pneumothorax. The heart size and mediastinal contours are within normal limits. The visualized skeletal structures are unremarkable. IMPRESSION: 1. Moderate severity diffuse bilateral infiltrates, increased in severity when compared to the prior chest plain film dated May 01, 2019. Electronically Signed   By: Aram Candela M.D.   On: 04/19/2019 18:28    EKG: Independently reviewed.  Assessment/Plan Principal Problem:   Acute respiratory distress syndrome (ARDS) due to COVID-19 virus [facility]) Active Problems:   Controlled type 2 diabetes mellitus without complication, without long-term current use of insulin (HCC)   Essential hypertension   Acute respiratory failure with hypoxia (HCC)    1. ARDS due to COVID-19 - 1. COVID pathway 2. Cont pulse ox 3. Tele monitor 4. Remdesivir 5. Decadron 6. Nl WBC, elevated CRP, nl procalcitonin; no h/o liver disease, AST/ALT are nl, does have significant O2 requirement as noted above.  Will therefore start actemra.  Discussed risks / benefits with patient who agrees to treatment. 7. Tylenol PRN 8. Will also check an RVP to see if any other superimposed viral infections with the COVID. 2. DM2 - 1. Hold metformin 2. Mod scale SSI AC/HS 3. HTN - 1. Cont home BP meds  DVT prophylaxis: Lovenox Code Status: Full Family Communication: No family in room Disposition Plan: Admit to [facility] Consults called: None Admission status: Admit to inpatient  Severity of Illness: The appropriate patient status for this patient is INPATIENT. Inpatient status is judged to be reasonable and necessary in order to provide the required intensity of service to ensure the patient's safety. The patient's presenting symptoms, physical exam findings, and initial radiographic and laboratory data in the context of their  chronic comorbidities is felt to place them at high risk for further clinical deterioration. Furthermore, it is not anticipated that the patient will be medically stable for discharge from the hospital within  2 midnights of admission. The following factors support the patient status of inpatient.   IP status due to acute hypoxic respiratory failure / ARDS due to COVID-19.   * I certify that at the point of admission it is my clinical judgment that the patient will require inpatient hospital care spanning beyond 2 midnights from the point of admission due to high intensity of service, high risk for further deterioration and high frequency of surveillance required.*    [name] M. DO [facility]  How to contact the [facility] Attending or Consulting provider [place] or covering provider during after hours [place], for this patient?  1. Check the care team in [facility] and look for a) attending/consulting TR[facility] provider listed and b) the [facility] team listed 2. Log into [email or web address]  Amion Physician Scheduling and messaging for groups and whole hospitals  On call and physician scheduling software for group practices, residents, hospitalists and other medical providers for call, clinic, rotation and shift schedules. OnCall [employer] is a hospital-wide system for scheduling doctors and paging doctors on call. EasyPlot is for scientific plotting and data analysis.  [email or web address]  and use [place]'s universal password to access. If you do not have the password, please contact the hospital operator.  3. Locate the [facility] provider you are looking for under [facility] and page to a number that you can be directly reached. 4. If you still have difficulty reaching the provider, please page the [facility] (Director on Call) for the Hospitalists listed on amion for assistance.  2019-05-23, 8:27 PM

## 2019-05-03 NOTE — Progress Notes (Signed)
Subjective:    Patient ID: [name], female    DOB: Apr 28, 1975, 44 y.o.   MRN: [id number]  HPI  Virtual Visit via Video Note  I connected with [name] on 05-10-19 at  3:20 PM EST by a video enabled telemedicine application and verified that I am speaking with the correct person using two identifiers.  Location: Patient: Home Provider: Office   I discussed the limitations of evaluation and management by telemedicine and the availability of in person appointments. The patient expressed understanding and agreed to proceed.  History of Present Illness:  [name] is a 44 year old female with a medical history of hypertension, Covid-19 infection, hyperlipidemia, type 2 diabetes, asthma, anxiety disorder who presents today with a chief complaint of shortness of breath. Her husband is providing most of the information for HPI due to her shortness of breath.   She was evaluated at [facility] on 05/01/19 with complaints of shortness of breath and positive Covid-19 infection. She endorsed continued cough, shortness of breath that was worse at night. Oxygen saturation decreased to around 93%, typical is 98%.   During her stay in the ED she underwent lab tests which were "reassuring", oxygen saturation remained above 94% on room air when ambulating. Chest xray consistent with multifocal pneumonia. She was discharged home later that morning with return precautions as she did not meet hospital criteria for admission.   Since discharge home her home oxygen saturation has been in the upper 70's to mid 80's. She's been using her nebulizer machine four times daily, she's using her husbands CPAP machine, taking Tylenol and Aleve frequently during the day. Temperatures are running 99- low 100's with treatment. She has increased weakness, needs assistance with ADL's. She completed her oral prednisone course yesterday without improvement.    Observations/Objective:  Alert and oriented. Appears well,  not sickly. No distress. Speaking in complete sentences.   Assessment and Plan:  Covid-19 viral infection with complications of acute hypoxia and dyspnea. Also (likely) viral pneumonia. Given room air oxygen saturation readings (hypoxia) coupled with minimal exertional dyspnea, weakness I recommended she go to the nearest ED now. She needs supplemental oxygen, repeat imaging, labs, perhaps IV treatment for Covid-19.  She initially refused but has changed her mind. Her husband will take her to [facility] ED. Report called to Triage nurse by medical assistant.   Follow Up Instructions:  Go to the hospital now as discussed.   It was a pleasure to see you today! Tricia Reel, NP-C    I discussed the assessment and treatment plan with the patient. The patient was provided an opportunity to ask questions and all were answered. The patient agreed with the plan and demonstrated an understanding of the instructions.   The patient was advised to call back or seek an in-person evaluation if the symptoms worsen or if the condition fails to improve as anticipated.   Doreene Nest, NP    Review of Systems  Constitutional: Positive for chills, diaphoresis, fatigue and fever.  HENT: Positive for congestion.   Respiratory: Positive for cough, shortness of breath and wheezing.   Cardiovascular: Negative for chest pain.  Neurological: Positive for weakness.       Past Medical History:  Diagnosis Date  . Acne   . Allergic rhinitis   . Anxiety   . Asthma   . Blood in stool   . Chronic bronchitis   . Dyspnea    At times  . Dysrhythmia  Tachycardia on bystolic for  . GERD (gastroesophageal reflux disease)   . HLD (hyperlipidemia)   . Pneumonia    hx of  . Pre-diabetes    On metformin preventively  . Seasonal allergies      Social History   Socioeconomic History  . Marital status: Married    Spouse name: Not on file  . Number of children: Not on file  . Years of  education: Not on file  . Highest education level: Not on file  Occupational History  . Not on file  Tobacco Use  . Smoking status: Never Smoker  . Smokeless tobacco: Never Used  Substance and Sexual Activity  . Alcohol use: No    Alcohol/week: 0.0 standard drinks  . Drug use: No  . Sexual activity: Yes    Birth control/protection: Pill  Other Topics Concern  . Not on file  Social History Narrative   No regular exercise   Social Determinants of Health   Financial Resource Strain:   . Difficulty of Paying Living Expenses: Not on file  Food Insecurity:   . Worried About Charity fundraiser in the Last Year: Not on file  . Ran Out of Food in the Last Year: Not on file  Transportation Needs:   . Lack of Transportation (Medical): Not on file  . Lack of Transportation (Non-Medical): Not on file  Physical Activity:   . Days of Exercise per Week: Not on file  . Minutes of Exercise per Session: Not on file  Stress:   . Feeling of Stress : Not on file  Social Connections:   . Frequency of Communication with Friends and Family: Not on file  . Frequency of Social Gatherings with Friends and Family: Not on file  . Attends Religious Services: Not on file  . Active Member of Clubs or Organizations: Not on file  . Attends Archivist Meetings: Not on file  . Marital Status: Not on file  Intimate Partner Violence:   . Fear of Current or Ex-Partner: Not on file  . Emotionally Abused: Not on file  . Physically Abused: Not on file  . Sexually Abused: Not on file    Past Surgical History:  Procedure Laterality Date  . ANAL FISSURE REPAIR N/A 05/23/2016   Procedure: Lateral Internal Sphincterotomy and Excision of Internal Tag;  Surgeon: Jackolyn Confer, MD;  Location: [facility];  Service: General;  Laterality: N/A;  . CHOLECYSTECTOMY    . CYST REMOVAL TRUNK     Bottom of spine  . HEMORROIDECTOMY    . TONSILLECTOMY      Family History  Problem Relation Age of Onset  . Heart  attack Father   . Arthritis Other        Family hx  . Diabetes Other        1st degree relative  . Hypertension Other        Family hx  . Stroke Other        1st degree relative < 50  . Other Other        Cardiovascular disorder--Family hx    Allergies  Allergen Reactions  . Levalbuterol Shortness Of Breath  . Buspar [Buspirone Hcl]      Fainting feelings   . Sulfamethoxazole     REACTION: unspecified  . Sulfonamide Derivatives     REACTION: rash    Current Outpatient Medications on File Prior to Visit  Medication Sig Dispense Refill  . albuterol (PROVENTIL) (2.5 MG/3ML) 0.083% nebulizer  solution INHALE CONTENTS OF 1 VIAL VIA NEBULIZER EVERY 6 HOURS AS NEEDED FOR WHEEZING 150 mL 0  . albuterol (VENTOLIN HFA) 108 (90 Base) MCG/ACT inhaler INHALE 2 PUFFS INTO THE LUNGS EVERY 6 HOURS AS NEEDED FOR WHEEZING OR SHORTNESS OF BREATH 8.5 g 0  . atorvastatin (LIPITOR) 20 MG tablet TAKE 1 TABLET BY MOUTH DAILY FOR CHOLESTEROL 90 tablet 1  . cetirizine (ZYRTEC) 10 MG tablet Take 10 mg by mouth daily.      Marland Kitchen losartan (COZAAR) 50 MG tablet Take 1 tablet (50 mg total) by mouth daily. For blood pressure. 90 tablet 3  . MELATONIN PO Take 6 mg by mouth at bedtime.    . metFORMIN (GLUCOPHAGE) 500 MG tablet TAKE 1 TABLET(500 MG) BY MOUTH TWICE DAILY WITH A MEAL 180 tablet 1  . mometasone-formoterol (DULERA) 200-5 MCG/ACT AERO INHALE 1-2 PUFFS BY MOUTH twice daily. 39 g 1  . montelukast (SINGULAIR) 10 MG tablet TAKE 1 TABLET BY MOUTH EVERY NIGHT AT BEDTIME 90 tablet 1  . nebivolol (BYSTOLIC) 10 MG tablet Take 1 tablet (10 mg total) by mouth daily. 90 tablet 1  . predniSONE (DELTASONE) 20 MG tablet Take 2 tablets once daily for five days. 10 tablet 0  . Probiotic Product (ULTRAFLORA IMMUNE HEALTH PO) Take by mouth.    . sertraline (ZOLOFT) 50 MG tablet Take 1 tablet (50 mg total) by mouth daily. For anxiety. 30 tablet 1  . SYEDA 3-0.03 MG tablet TAKE 1 TABLET BY MOUTH DAILY 84 tablet 2   No  current facility-administered medications on file prior to visit.    BP 103/72   Pulse 83   Temp 100 F (37.8 C) (Oral)   LMP 04/10/2019   SpO2 (!) 81%    Objective:   Physical Exam  Constitutional: She is oriented to person, place, and time. She appears well-nourished. She appears ill.  Respiratory: Effort normal.  No cough during visit. Pulse oxygenation during visit remained between 83%-85% on room air.  Neurological: She is alert and oriented to person, place, and time.  Psychiatric: She has a normal mood and affect.           Assessment & Plan:

## 2019-05-03 NOTE — ED Notes (Signed)
This RN and tech, Myriam Jacobson, were unable to draw blood for labs. Phlebotomy has been contacted.

## 2019-05-04 ENCOUNTER — Inpatient Hospital Stay (HOSPITAL_COMMUNITY): Payer: Managed Care, Other (non HMO)

## 2019-05-04 LAB — COMPREHENSIVE METABOLIC PANEL
ALT: 27 U/L (ref 0–44)
AST: 32 U/L (ref 15–41)
Albumin: 3 g/dL — ABNORMAL LOW (ref 3.5–5.0)
Alkaline Phosphatase: 106 U/L (ref 38–126)
Anion gap: 12 (ref 5–15)
BUN: 8 mg/dL (ref 6–20)
CO2: 23 mmol/L (ref 22–32)
Calcium: 8.8 mg/dL — ABNORMAL LOW (ref 8.9–10.3)
Chloride: 106 mmol/L (ref 98–111)
Creatinine, Ser: 0.58 mg/dL (ref 0.44–1.00)
GFR calc Af Amer: >60 mL/min (ref >60)
GFR calc non Af Amer: >60 mL/min (ref >60)
Glucose, Bld: 143 mg/dL — ABNORMAL HIGH (ref 70–99)
Potassium: 4 mmol/L (ref 3.5–5.1)
Sodium: 141 mmol/L (ref 135–145)
Total Bilirubin: 0.4 mg/dL (ref 0.3–1.2)
Total Protein: 7.6 g/dL (ref 6.5–8.1)

## 2019-05-04 LAB — CBC WITH DIFFERENTIAL/PLATELET
Abs Immature Granulocytes: 0.02 10*3/uL (ref 0.00–0.07)
Basophils Absolute: 0 10*3/uL (ref 0.0–0.1)
Basophils Relative: 0 %
Eosinophils Absolute: 0 10*3/uL (ref 0.0–0.5)
Eosinophils Relative: 0 %
HCT: 40 % (ref 36.0–46.0)
Hemoglobin: 12.9 g/dL (ref 12.0–15.0)
Immature Granulocytes: 0 %
Lymphocytes Relative: 21 %
Lymphs Abs: 1.1 10*3/uL (ref 0.7–4.0)
MCH: 26.9 pg (ref 26.0–34.0)
MCHC: 32.3 g/dL (ref 30.0–36.0)
MCV: 83.3 fL (ref 80.0–100.0)
Monocytes Absolute: 0.1 10*3/uL (ref 0.1–1.0)
Monocytes Relative: 2 %
Neutro Abs: 4.2 10*3/uL (ref 1.7–7.7)
Neutrophils Relative %: 77 %
Platelets: 222 10*3/uL (ref 150–400)
RBC: 4.8 MIL/uL (ref 3.87–5.11)
RDW: 14 % (ref 11.5–15.5)
WBC: 5.5 10*3/uL (ref 4.0–10.5)
nRBC: 0 % (ref 0.0–0.2)

## 2019-05-04 LAB — RESPIRATORY PANEL BY PCR

## 2019-05-04 LAB — ABO/RH
ABO/RH(D): B POS
ABO/RH(D): B POS

## 2019-05-04 LAB — GLUCOSE, CAPILLARY
Glucose-Capillary: 148 mg/dL — ABNORMAL HIGH (ref 70–99)
Glucose-Capillary: 143 mg/dL — ABNORMAL HIGH (ref 70–99)
Glucose-Capillary: 197 mg/dL — ABNORMAL HIGH (ref 70–99)

## 2019-05-04 LAB — C-REACTIVE PROTEIN: CRP: 25.1 mg/dL — ABNORMAL HIGH (ref <1.0)

## 2019-05-04 LAB — D-DIMER, QUANTITATIVE: D-Dimer, Quant: 0.62 ug/mL-FEU — ABNORMAL HIGH (ref 0.00–0.50)

## 2019-05-04 LAB — HEMOGLOBIN A1C
Hgb A1c MFr Bld: 6.8 % — ABNORMAL HIGH (ref 4.8–5.6)
Mean Plasma Glucose: 148.46 mg/dL

## 2019-05-04 LAB — HIV ANTIBODY (ROUTINE TESTING W REFLEX): HIV Screen 4th Generation wRfx: NONREACTIVE

## 2019-05-04 MED ORDER — CHLORHEXIDINE GLUCONATE CLOTH 2 % EX PADS
6.0000 | MEDICATED_PAD | Freq: Every day | CUTANEOUS | Status: DC
  Administered 2019-05-05 – 2019-05-17 (×12): 6 via TOPICAL

## 2019-05-04 MED ORDER — SODIUM CHLORIDE 0.9% IV SOLUTION: Freq: Once | INTRAVENOUS | Status: AC

## 2019-05-04 MED ORDER — ORAL CARE MOUTH RINSE
15.0000 mL | Freq: Two times a day (BID) | OROMUCOSAL | Status: DC
  Administered 2019-05-04 – 2019-05-05 (×2): 15 mL via OROMUCOSAL

## 2019-05-04 MED ORDER — DIAZEPAM 2 MG PO TABS
2.0000 mg | ORAL_TABLET | Freq: Four times a day (QID) | ORAL | Status: DC | PRN
  Administered 2019-05-04 – 2019-05-05 (×2): 2 mg via ORAL
  Filled 2019-05-04 (×2): qty 1

## 2019-05-04 MED ORDER — DEXAMETHASONE SODIUM PHOSPHATE 10 MG/ML IJ SOLN
6.0000 mg | Freq: Two times a day (BID) | INTRAMUSCULAR | Status: DC
  Administered 2019-05-04 – 2019-05-12 (×18): 6 mg via INTRAVENOUS
  Filled 2019-05-04 (×18): qty 1

## 2019-05-04 NOTE — Progress Notes (Signed)
Husband updated of pt's condition. All questions were answered.

## 2019-05-04 NOTE — Progress Notes (Signed)
Patient complaints of shortness of breath. Patient on NRB at 15L, O2 saturation between 78-82%. Patient placed on HFNC at 15L to help supplement NRB. Patient to be transferred to unit to received heated HF. Waiting for bed. Family to be updated of patients status.

## 2019-05-04 NOTE — Progress Notes (Signed)
Called Spouse Glen to update with room change to ICU 9203.

## 2019-05-04 NOTE — Progress Notes (Signed)
Pt transferred to ICU via bed. Alert and oriented. Placed on heated hiflow 40L 100% sat mid 80s, placed on NRB, sats slowly improving. States no pain or discomfort. No acute distress noted, will report to oncoming shift.

## 2019-05-04 NOTE — Plan of Care (Signed)
  Problem: Education: Goal: Knowledge of risk factors and measures for prevention of condition will improve Outcome: Progressing   Problem: Coping: Goal: Psychosocial and spiritual needs will be supported Outcome: Progressing   Problem: Respiratory: Goal: Will maintain a patent airway Outcome: Progressing   

## 2019-05-04 NOTE — ED Notes (Signed)
ED TO INPATIENT HANDOFF REPORT  ED Nurse Name and Phone #: Sharene Skeans [phone]  S Name/Age/Gender [name] 44 y.o. female Room/Bed: WA21/WA21  Code Status   Code Status: Full Code  Home/SNF/Other Home Patient oriented to: self, place, time and situation Is this baseline? Yes   Triage Complete: Triage complete  Chief Complaint Acute respiratory distress syndrome (ARDS) due to COVID-19 virus (HCC) [U07.1, J80]  Triage Note EMS reports from home, Covid + 1/12.. Seen at [facility] Sunday for SOB Dx with PNA. EMS reports SOB and Sp02 72 on scene, non rebreather placed @ 10 Ltrs Sp02 up to 96. Given Neb enroute without result.  BP 116/56 HR 80 Sp02 on Non Reb 96 RR24 Temp 97.2      Allergies Allergies  Allergen Reactions  . Levalbuterol Shortness Of Breath  . Buspar [Buspirone Hcl]      Fainting feelings   . Sulfamethoxazole     REACTION: unspecified  . Sulfonamide Derivatives     REACTION: rash    Level of Care/Admitting Diagnosis ED Disposition    ED Disposition Condition Comment   Admit  Hospital Area: [facility] [[id number]]  Level of Care: Progressive [102]  Covid Evaluation: Confirmed COVID Positive  Diagnosis: Acute respiratory distress syndrome (ARDS) due to COVID-19 virus (HCC) [[id number]]  Admitting Physician: GARDNER, JARED M [[id number]]  Attending Physician: GARDNER, JARED M [[id number]]  Estimated length of stay: past midnight tomorrow  Certification:: I certify this patient will need inpatient services for at least 2 midnights       B Medical/Surgery History Past Medical History:  Diagnosis Date  . Acne   . Allergic rhinitis   . Anxiety   . Asthma   . Blood in stool   . Chronic bronchitis   . Dyspnea    At times  . Dysrhythmia    Tachycardia on bystolic for  . GERD (gastroesophageal reflux disease)   . HLD (hyperlipidemia)   . Pneumonia    hx of  . Pre-diabetes    On metformin preventively  . Seasonal allergies    Past  Surgical History:  Procedure Laterality Date  . ANAL FISSURE REPAIR N/A 05/23/2016   Procedure: Lateral Internal Sphincterotomy and Excision of Internal Tag;  Surgeon: Todd Rosenbower, MD;  Location: [facility];  Service: General;  Laterality: N/A;  . CHOLECYSTECTOMY    . CYST REMOVAL TRUNK     Bottom of spine  . HEMORROIDECTOMY    . TONSILLECTOMY       A IV Location/Drains/Wounds Patient Lines/Drains/Airways Status   Active Line/Drains/Airways    Name:   Placement date:   Placement time:   Site:   Days:   Peripheral IV 05/08/2019 Left Antecubital   04/30/2019    1836    Antecubital   1   External Urinary Catheter   05/05/2019    1812    --   1   Incision (Closed) 05/23/16 Rectum Other (Comment)   05/23/16    1146     10 76          Intake/Output Last 24 hours  Intake/Output Summary (Last 24 hours) at 05/04/2019 0124 Last data filed at 2019/05/18 2214 Gross per 24 hour  Intake 390 ml  Output --  Net 390 ml    Labs/Imaging Results for orders placed or performed during the hospital encounter of 05/18/2019 (from the past 48 hour(s))  Lactic acid, plasma     Status: Abnormal   Collection Time: May 18, 2019  6:42 PM  Result Value Ref Range   Lactic Acid, Venous 2.1 (HH) 0.5 - 1.9 mmol/L    Comment: CRITICAL RESULT CALLED TO, READ BACK BY AND VERIFIED WITH: B.BROOKS AT 1937 ON 05/02/2019 BY N.THOMPSON Performed at [facility], [address] [place], [place] [address]   CBC WITH DIFFERENTIAL     Status: None   Collection Time: 05/07/2019  6:42 PM  Result Value Ref Range   WBC 6.8 4.0 - 10.5 K/uL   RBC 4.67 3.87 - 5.11 MIL/uL   Hemoglobin 12.6 12.0 - 15.0 g/dL   HCT 60.440.2 54.036.0 - 98.146.0 %   MCV 86.1 80.0 - 100.0 fL   MCH 27.0 26.0 - 34.0 pg   MCHC 31.3 30.0 - 36.0 g/dL   RDW 19.113.8 47.811.5 - 29.515.5 %   Platelets 214 150 - 400 K/uL   nRBC 0.0 0.0 - 0.2 %   Neutrophils Relative % 78 %   Neutro Abs 5.2 1.7 - 7.7 K/uL   Lymphocytes Relative 21 %   Lymphs Abs 1.4 0.7 - 4.0 K/uL    Monocytes Relative 1 %   Monocytes Absolute 0.1 0.1 - 1.0 K/uL   Eosinophils Relative 0 %   Eosinophils Absolute 0.0 0.0 - 0.5 K/uL   Basophils Relative 0 %   Basophils Absolute 0.0 0.0 - 0.1 K/uL   Immature Granulocytes 0 %   Abs Immature Granulocytes 0.03 0.00 - 0.07 K/uL    Comment: Performed at [facility], [address] [place], [place] [address]  Comprehensive metabolic panel     Status: Abnormal   Collection Time: 05/02/2019  6:42 PM  Result Value Ref Range   Sodium 136 135 - 145 mmol/L   Potassium 3.5 3.5 - 5.1 mmol/L   Chloride 100 98 - 111 mmol/L   CO2 24 22 - 32 mmol/L   Glucose, Bld 164 (H) 70 - 99 mg/dL   BUN 8 6 - 20 mg/dL   Creatinine, Ser 8.650.69 0.44 - 1.00 mg/dL   Calcium 8.6 (L) 8.9 - 10.3 mg/dL   Total Protein 7.5 6.5 - 8.1 g/dL   Albumin 3.0 (L) 3.5 - 5.0 g/dL   AST 36 15 - 41 U/L   ALT 28 0 - 44 U/L   Alkaline Phosphatase 103 38 - 126 U/L   Total Bilirubin 0.4 0.3 - 1.2 mg/dL   GFR calc non Af Amer >60 >60 mL/min   GFR calc Af Amer >60 >60 mL/min   Anion gap 12 5 - 15    Comment: Performed at [facility], [address] [place], [place] [address]  D-dimer, quantitative     Status: Abnormal   Collection Time: 05/13/2019  6:42 PM  Result Value Ref Range   D-Dimer, Quant 0.65 (H) 0.00 - 0.50 ug/mL-FEU    Comment: (NOTE) At the manufacturer cut-off of 0.50 ug/mL FEU, this assay has been documented to exclude PE with a sensitivity and negative predictive value of 97 to 99%.  At this time, this assay has not been approved by the FDA to exclude DVT/VTE. Results should be correlated with clinical presentation. Performed at [facility], [address] [place], [place] [address]   Procalcitonin     Status: None   Collection Time: 05/10/2019  6:42 PM  Result Value Ref Range   Procalcitonin <0.10 ng/mL    Comment:        Interpretation: PCT (Procalcitonin) <= 0.5 ng/mL: Systemic infection (sepsis)  is  not likely. Local bacterial infection is possible. (NOTE)       Sepsis PCT Algorithm           Lower Respiratory Tract                                      Infection PCT Algorithm    ----------------------------     ----------------------------         PCT < 0.25 ng/mL                PCT < 0.10 ng/mL         Strongly encourage             Strongly discourage   discontinuation of antibiotics    initiation of antibiotics    ----------------------------     -----------------------------       PCT 0.25 - 0.50 ng/mL            PCT 0.10 - 0.25 ng/mL               OR       >80% decrease in PCT            Discourage initiation of                                            antibiotics      Encourage discontinuation           of antibiotics    ----------------------------     -----------------------------         PCT >= 0.50 ng/mL              PCT 0.26 - 0.50 ng/mL               AND        <80% decrease in PCT             Encourage initiation of                                             antibiotics       Encourage continuation           of antibiotics    ----------------------------     -----------------------------        PCT >= 0.50 ng/mL                  PCT > 0.50 ng/mL               AND         increase in PCT                  Strongly encourage                                      initiation of antibiotics    Strongly encourage escalation           of antibiotics                                     -----------------------------  PCT <= 0.25 ng/mL                                                 OR                                        > 80% decrease in PCT                                     Discontinue / Do not initiate                                             antibiotics Performed at [facility], [address] [place], [place] [address]   Lactate dehydrogenase     Status: Abnormal   Collection Time: 2019-05-19   6:42 PM  Result Value Ref Range   LDH 277 (H) 98 - 192 U/L    Comment: Performed at [facility], [address] [place], [place] [address]  Ferritin     Status: None   Collection Time: 2019-05-19  6:42 PM  Result Value Ref Range   Ferritin 70 11 - 307 ng/mL    Comment: Performed at [facility], [address], [place], [place] [address]  Triglycerides     Status: Abnormal   Collection Time: 19-May-2019  6:42 PM  Result Value Ref Range   Triglycerides 676 (H) <150 mg/dL    Comment: Performed at [facility], [address], [place], [place] [address]  Fibrinogen     Status: Abnormal   Collection Time: 05-19-19  6:42 PM  Result Value Ref Range   Fibrinogen >800 (H) 210 - 475 mg/dL    Comment: Performed at [facility], [address] [place], [place] [address]  C-reactive protein     Status: Abnormal   Collection Time: May 19, 2019  6:42 PM  Result Value Ref Range   CRP 19.9 (H) <1.0 mg/dL    Comment: Performed at [facility], [address], [place], [place] [address]  hCG, quantitative, pregnancy     Status: None   Collection Time: 2019-05-19  6:42 PM  Result Value Ref Range   hCG, Beta Chain, Quant, S <1 <5 mIU/mL    Comment:          GEST. AGE      CONC.  (mIU/mL)   <=1 WEEK        5 - 50     2 WEEKS       50 - 500     3 WEEKS       100 - 10,000     4 WEEKS     1,000 - 30,000     5 WEEKS     3,500 - 115,000   6-8 WEEKS     12,000 - 270,000    12 WEEKS     15,000 - 220,000        FEMALE AND NON-PREGNANT FEMALE:     LESS THAN 5 mIU/mL Performed at  [facility], [place] [address], [place], [place] [address]   Lactic acid, plasma     Status: None   Collection Time: 05/13/19  9:10 PM  Result Value Ref Range   Lactic Acid, Venous 1.7 0.5 - 1.9 mmol/L    Comment: Performed at [facility], [place] [address], [place], [place] [address]  ABO/Rh     Status:  None   Collection Time: May 13, 2019  9:10 PM  Result Value Ref Range   ABO/RH(D)      B POS Performed at [facility], [place] [address], [place], [place] [address]   CBG monitoring, ED     Status: Abnormal   Collection Time: 2019/05/13  9:40 PM  Result Value Ref Range   Glucose-Capillary 255 (H) 70 - 99 mg/dL   DG Chest Port 1 View  Result Date: 05-13-2019 CLINICAL DATA:  Worsening shortness of breath. EXAM: PORTABLE CHEST 1 VIEW COMPARISON:  May 01, 2019 FINDINGS: Moderate severity diffuse bilateral infiltrates are seen. This is increased in severity when compared to the prior study. There is no evidence of a pleural effusion or pneumothorax. The heart size and mediastinal contours are within normal limits. The visualized skeletal structures are unremarkable. IMPRESSION: 1. Moderate severity diffuse bilateral infiltrates, increased in severity when compared to the prior chest plain film dated May 01, 2019. Electronically Signed   By: Virgina Norfolk M.D.   On: 05-13-2019 18:28    Pending Labs Unresulted Labs (From admission, onward)    Start     Ordered   05/04/19 0500  CBC with Differential/Platelet  Daily,   R     05/13/19 2026   05/04/19 0500  Comprehensive metabolic panel  Daily,   R     2019/05/13 2026   05/04/19 0500  C-reactive protein  Daily,   R     May 13, 2019 2026   05/04/19 0500  D-dimer, quantitative (not at [facility])  Daily,   R     13-May-2019 2026   2019/05/13 2050  Respiratory Panel by PCR  (Respiratory virus panel with precautions)  Once,   STAT     13-May-2019 2050   05-13-2019 2024  HIV Antibody (routine testing w rflx)  (HIV Antibody (Routine testing w reflex) panel)  Once,   STAT     05/13/19 2026   May 13, 2019 2010  Hemoglobin A1c  Once,   STAT    Comments: To assess prior glycemic control    2019-05-13 2009   13-May-2019 1804  Blood Culture (routine x 2)  BLOOD CULTURE X 2,   STAT     May 13, 2019 1804          Vitals/Pain Today's Vitals   May 13, 2019 2324  2019-05-13 2330 05/04/19 0000 05/04/19 0113  BP: (!) 157/80 122/88 127/71 132/79  Pulse: 80 82 79 84  Resp: (!) 29 18 (!) 31 (!) 22  Temp: 98.3 F (36.8 C)     TempSrc: Oral     SpO2: (!) 86% (!) 85% (!) 88% (!) 87%  Weight:      PainSc:        Isolation Precautions Airborne and Contact precautions  Medications Medications  remdesivir 200 mg in sodium chloride 0.9% 250 mL IVPB (0 mg Intravenous Stopped 05/13/2019 2007)    Followed by  remdesivir 100 mg in sodium chloride 0.9 % 100 mL IVPB (has no administration in time range)  nebivolol (BYSTOLIC) tablet 10 mg (has no administration in time range)  sertraline (ZOLOFT)  tablet 50 mg (has no administration in time range)  montelukast (SINGULAIR) tablet 10 mg (10 mg Oral Given 2019/05/06 2118)  mometasone-formoterol (DULERA) 200-5 MCG/ACT inhaler 2 puff (2 puffs Inhalation Given 05-06-19 2113)  losartan (COZAAR) tablet 50 mg (has no administration in time range)  atorvastatin (LIPITOR) tablet 20 mg (has no administration in time range)  albuterol (VENTOLIN HFA) 108 (90 Base) MCG/ACT inhaler 2 puff (has no administration in time range)  insulin aspart (novoLOG) injection 0-5 Units (3 Units Subcutaneous Given May 06, 2019 2144)  insulin aspart (novoLOG) injection 0-15 Units (has no administration in time range)  dexamethasone (DECADRON) injection 6 mg (has no administration in time range)  guaiFENesin-dextromethorphan (ROBITUSSIN DM) 100-10 MG/5ML syrup 10 mL (has no administration in time range)  chlorpheniramine-HYDROcodone (TUSSIONEX) 10-8 MG/5ML suspension 5 mL (has no administration in time range)  acetaminophen (TYLENOL) tablet 650 mg (has no administration in time range)  ondansetron (ZOFRAN) tablet 4 mg (has no administration in time range)    Or  ondansetron (ZOFRAN) injection 4 mg (has no administration in time range)  enoxaparin (LOVENOX) injection 65 mg (65 mg Subcutaneous Given 06-May-2019 2142)  albuterol (VENTOLIN HFA) 108 (90 Base)  MCG/ACT inhaler 4 puff (4 puffs Inhalation Given 06-May-2019 1839)  dexamethasone (DECADRON) injection 10 mg (10 mg Intravenous Given 05-06-2019 1838)  lip balm (CARMEX) ointment ( Topical Given 05/06/19 1937)  tocilizumab (ACTEMRA) 800 mg in sodium chloride 0.9 % 100 mL infusion (0 mg Intravenous Stopped 05/06/2019 2214)    Mobility walks Low fall risk   Focused Assessments Pulmonary Assessment Handoff:  Lung sounds: L Breath Sounds: Diminished, Inspiratory wheezes, Expiratory wheezes R Breath Sounds: Diminished, Inspiratory wheezes, Expiratory wheezes O2 Device: NRB O2 Flow Rate (L/min): 15 L/min      R Recommendations: See Admitting Provider Note  Report given to:   Additional Notes:

## 2019-05-04 NOTE — Progress Notes (Signed)
   05/04/19 1600  Family/Significant Other Communication  Family/Significant Other Update Called;Updated (updated spouse Sherrine Maples )  updated spouse Sherrine Maples with plan of care and plan to transfer when bed available

## 2019-05-04 NOTE — Progress Notes (Addendum)
PROGRESS NOTE  Syrenity Klepacki  YQM:[id number] DOB: 12/10/1975 DOA: 24-May-2019 PCP: Doreene Nest, NP   Brief Narrative: Grettel Rames is a 44 y.o. female with a history of HTN, obesity, asthma, T2DM who tested positive for covid-19 on 1/12, presented lethargic from home with severe hypoxemia found to have advancing diffuse bilateral infiltrates on CXR. Remdesivir, tocilizumab, and steroids given and patient admitted to [facility]. Hypoxemia has continued/worsened for which heated high flow oxygen is ordered.   Assessment & Plan: Principal Problem:   Acute respiratory distress syndrome (ARDS) due to COVID-19 virus [facility]) Active Problems:   Controlled type 2 diabetes mellitus without complication, without long-term current use of insulin (HCC)   Essential hypertension   Acute respiratory failure with hypoxia (HCC)  Acute hypoxemic respiratory failure due to covid-19 pneumonia on chronic asthma: SARS-CoV-2 PCR positive on 1/12, admitted 1/19. No leukocytosis, PCT elevation, and d-dimer very modestly elevated not suggestive of alternative causes of hypoxemia. - Repeat CXR. Recent CXR showed significant progression.  - Continue remdesivir x5 days 1/19 - 1/23 - Steroids x10 days, increase dose to 6mg  IV q12h given rising CRP - Given tocilizumab 1/19. Will monitor for secondary infections.  - Ordered CCP 1/20. - Vitamin C, zinc - Encourage OOB, IS, FV, and awake proning if able - Tylenol and antitussives prn - Continue airborne, contact precautions while admitted. Isolation period would be recommended for 21 days from positive testing. - Check CBC w/diff, CMP, CRP daily - Enoxaparin weight-based prophylactic dose.  - Maintain euvolemia/net negative.  - Avoid NSAIDs   T2DM: HbA1c 6.8% indicating good chronic control.  - SSI AC/HS  HTN:  - Hold BB while bradycardic, though suspect that is transient.  Obesity: BMI 49.93. Noted.  Depression/anxiety:  - Continue zoloft  DVT prophylaxis:  Lovenox 0.5mg /kg q24h Code Status: Full Family Communication: None at bedside. Pt to relay POC Disposition Plan: Transfer to ICU  Consultants:   Discussed with PCCM without formal consultation.  Procedures:   None  Antimicrobials:  Remdesivir   Subjective: This morning, she became clammy, weak, "like I'm going to pass out" as we discussed the next steps if hypoxemia worsens including HHFNC. Feels more able to take a deep breath and less short of breath at rest than yesterday. No chest pain.   Later this afternoon, she's growing a bit more short of breath, still speaking full sentences, but SpO2 trending down on NRB.   Objective: Vitals:   05/04/19 0540 05/04/19 0545 05/04/19 0826 05/04/19 1048  BP:  135/84 138/77 116/68  Pulse:  74    Resp: 18     Temp:   98.8 F (37.1 C)   TempSrc:   Axillary   SpO2:  (!) 86%  (!) 86%  Weight:      Height:        Intake/Output Summary (Last 24 hours) at 05/04/2019 1053 Last data filed at 05/04/2019 0918 Gross per 24 hour  Intake 490 ml  Output --  Net 490 ml   Filed Weights   May 24, 2019 2031 05/04/19 0538  Weight: 127 kg 123.8 kg    Gen: 44 y.o. female in no distress Pulm: Non-labored breathing 1%L NRB, RR ~18/min. Distant without wheezes bilaterally.  CV: Regular rate and rhythm, briefly bradycardic. No murmur, rub, or gallop. No definite JVD, no pitting pedal edema. GI: Abdomen soft, non-tender, non-distended, with normoactive bowel sounds. No organomegaly or masses felt. Ext: Warm, no deformities Skin: No rashes, lesions or ulcers on visualized skin Neuro: Alert and  oriented. No focal neurological deficits. Psych: Judgement and insight appear normal. Mood & affect appropriate.   Data Reviewed: I have personally reviewed following labs and imaging studies  CBC: Recent Labs  Lab 05/01/19 1144 05/15/19 1842 05/04/19 0900  WBC 6.6 6.8 5.5  NEUTROABS  --  5.2 4.2  HGB 12.5 12.6 12.9  HCT 38.8 40.2 40.0  MCV 86.2 86.1  83.3  PLT 191 214 222   Basic Metabolic Panel: Recent Labs  Lab 05/01/19 1055 2019/05/15 1842 05/04/19 0900  NA 135 136 141  K 4.3 3.5 4.0  CL 103 100 106  CO2 19* 24 23  GLUCOSE 162* 164* 143*  BUN 9 8 8   CREATININE 0.75 0.69 0.58  CALCIUM 8.7* 8.6* 8.8*   GFR: Estimated Creatinine Clearance: 113.9 mL/min (by C-G formula based on SCr of 0.58 mg/dL). Liver Function Tests: Recent Labs  Lab 05-15-19 1842 05/04/19 0900  AST 36 32  ALT 28 27  ALKPHOS 103 106  BILITOT 0.4 0.4  PROT 7.5 7.6  ALBUMIN 3.0* 3.0*   No results for input(s): LIPASE, AMYLASE in the last 168 hours. No results for input(s): AMMONIA in the last 168 hours. Coagulation Profile: No results for input(s): INR, PROTIME in the last 168 hours. Cardiac Enzymes: No results for input(s): CKTOTAL, CKMB, CKMBINDEX, TROPONINI in the last 168 hours. BNP (last 3 results) No results for input(s): PROBNP in the last 8760 hours. HbA1C: Recent Labs    05/15/19 2110  HGBA1C 6.8*   CBG: Recent Labs  Lab 05/15/19 2140 05/04/19 0823  GLUCAP 255* 148*   Lipid Profile: Recent Labs    2019-05-15 1842  TRIG 676*   Thyroid Function Tests: No results for input(s): TSH, T4TOTAL, FREET4, T3FREE, THYROIDAB in the last 72 hours. Anemia Panel: Recent Labs    05/15/19 1842  FERRITIN 70   Urine analysis:    Component Value Date/Time   COLORURINE STRAW (A) 04/08/2019 1137   APPEARANCEUR HAZY (A) 04/08/2019 1137   LABSPEC 1.003 (L) 04/08/2019 1137   PHURINE 6.0 04/08/2019 1137   GLUCOSEU NEGATIVE 04/08/2019 1137   HGBUR NEGATIVE 04/08/2019 1137   BILIRUBINUR NEGATIVE 04/08/2019 1137   BILIRUBINUR Negative 08/24/2018 0836   KETONESUR NEGATIVE 04/08/2019 1137   PROTEINUR NEGATIVE 04/08/2019 1137   UROBILINOGEN 0.2 08/24/2018 0836   NITRITE NEGATIVE 04/08/2019 1137   LEUKOCYTESUR TRACE (A) 04/08/2019 1137   Recent Results (from the past 240 hour(s))  Novel Coronavirus, NAA ([facility])     Status: Abnormal    Collection Time: 04/26/19  9:33 AM   Specimen: Nasopharyngeal(NP) swabs in vial transport medium   NASOPHARYNGE  TESTING  Result Value Ref Range Status   SARS-CoV-2, NAA Detected (A) Not Detected Final    Comment: This nucleic acid amplification test was developed and its performance characteristics determined by 06/24/19. Nucleic acid amplification tests include PCR and TMA. This test has not been FDA cleared or approved. This test has been authorized by FDA under an Emergency Use Authorization (EUA). This test is only authorized for the duration of time the declaration that circumstances exist justifying the authorization of the emergency use of in vitro diagnostic tests for detection of SARS-CoV-2 virus and/or diagnosis of COVID-19 infection under section 564(b)(1) of the Act, 21 U.S.C. [employer]) (1), unless the authorization is terminated or revoked sooner. When diagnostic testing is negative, the possibility of a false negative result should be considered in the context of a patient's recent exposures and the presence of clinical signs and  symptoms consistent with COVID-19. An individual without symptoms of COVID-19 and who is not shedding SARS-CoV-2 virus would  expect to have a negative (not detected) result in this assay.   Blood Culture (routine x 2)     Status: None (Preliminary result)   Collection Time: May 05, 2019  6:42 PM   Specimen: BLOOD  Result Value Ref Range Status   Specimen Description   Final    BLOOD LEFT ANTECUBITAL Performed at [facility] [address] [place], [place] [address]    Special Requests   Final    BOTTLES DRAWN AEROBIC AND ANAEROBIC Blood Culture adequate volume Performed at [facility] [address] [place], [place] [address]    Culture   Final    NO GROWTH < 12 HOURS Performed at [facility] [address] [place], [place] [address]    Report Status PENDING  Incomplete    Blood Culture (routine x 2)     Status: None (Preliminary result)   Collection Time: 05/05/2019  7:00 PM   Specimen: BLOOD  Result Value Ref Range Status   Specimen Description   Final    BLOOD RIGHT ANTECUBITAL Performed at [facility] [address], [place], [place] [address]    Special Requests   Final    BOTTLES DRAWN AEROBIC AND ANAEROBIC Blood Culture adequate volume Performed at [facility] [address], [place], [place] [address]    Culture   Final    NO GROWTH < 12 HOURS Performed at [facility] [address], [place], [place] [address]    Report Status PENDING  Incomplete  Respiratory Panel by PCR     Status: None   Collection Time: 05-May-2019  9:10 PM   Specimen: Nasopharyngeal Swab; Respiratory  Result Value Ref Range Status   Adenovirus NOT DETECTED NOT DETECTED Final   Coronavirus 229E NOT DETECTED NOT DETECTED Final    Comment: (NOTE) The Coronavirus on the Respiratory Panel, DOES NOT test for the novel  Coronavirus (2019 nCoV)    Coronavirus HKU1 NOT DETECTED NOT DETECTED Final   Coronavirus NL63 NOT DETECTED NOT DETECTED Final   Coronavirus OC43 NOT DETECTED NOT DETECTED Final   Metapneumovirus NOT DETECTED NOT DETECTED Final   Rhinovirus / Enterovirus NOT DETECTED NOT DETECTED Final   Influenza A NOT DETECTED NOT DETECTED Final   Influenza B NOT DETECTED NOT DETECTED Final   Parainfluenza Virus 1 NOT DETECTED NOT DETECTED Final   Parainfluenza Virus 2 NOT DETECTED NOT DETECTED Final   Parainfluenza Virus 3 NOT DETECTED NOT DETECTED Final   Parainfluenza Virus 4 NOT DETECTED NOT DETECTED Final   Respiratory Syncytial Virus NOT DETECTED NOT DETECTED Final   Bordetella pertussis NOT DETECTED NOT DETECTED Final   Chlamydophila pneumoniae NOT DETECTED NOT DETECTED Final   Mycoplasma pneumoniae NOT DETECTED NOT DETECTED Final    Comment: Performed at [facility], [address], [place], [place] [address]       Radiology Studies: DG Chest Port 1 View  Result Date: 05-05-2019 CLINICAL DATA:  Worsening shortness of breath. EXAM: PORTABLE CHEST 1 VIEW COMPARISON:  May 01, 2019 FINDINGS: Moderate severity diffuse bilateral infiltrates are seen. This is increased in severity when compared to the prior study. There is no evidence of a pleural effusion or pneumothorax. The heart size and mediastinal contours are within normal limits. The visualized skeletal structures are unremarkable. IMPRESSION: 1. Moderate severity diffuse bilateral infiltrates,  increased in severity when compared to the prior chest plain film dated May 01, 2019. Electronically Signed   By: Aram Candela M.D.   On: 05/01/2019 18:28    Scheduled Meds: . sodium chloride   Intravenous Once  . atorvastatin  20 mg Oral Daily  . dexamethasone (DECADRON) injection  6 mg Intravenous Q12H  . enoxaparin (LOVENOX) injection  65 mg Subcutaneous Q24H  . insulin aspart  0-15 Units Subcutaneous TID WC  . insulin aspart  0-5 Units Subcutaneous QHS  . losartan  50 mg Oral Daily  . mometasone-formoterol  2 puff Inhalation BID  . montelukast  10 mg Oral QHS  . nebivolol  10 mg Oral Daily  . sertraline  50 mg Oral Daily   Continuous Infusions: . remdesivir 100 mg in NS 100 mL 100 mg (05/04/19 0918)     LOS: 1 day   Time spent: 35 minutes.  Tyrone Nine, MD [facility] [email or web address] 05/04/2019, 10:53 AM

## 2019-05-04 NOTE — ED Notes (Signed)
Called [facility] 

## 2019-05-05 ENCOUNTER — Inpatient Hospital Stay (HOSPITAL_COMMUNITY): Payer: Managed Care, Other (non HMO)

## 2019-05-05 DIAGNOSIS — U071 COVID-19: Principal | ICD-10-CM

## 2019-05-05 DIAGNOSIS — J80 Acute respiratory distress syndrome: Secondary | ICD-10-CM

## 2019-05-05 DIAGNOSIS — I469 Cardiac arrest, cause unspecified: Secondary | ICD-10-CM

## 2019-05-05 LAB — BPAM FFP
Blood Product Expiration Date: 202101211313
ISSUE DATE / TIME: 202101201317
Unit Type and Rh: 7300

## 2019-05-05 LAB — POCT I-STAT 7, (LYTES, BLD GAS, ICA,H+H)
Acid-Base Excess: 2 mmol/L (ref 0.0–2.0)
Bicarbonate: 29.8 mmol/L — ABNORMAL HIGH (ref 20.0–28.0)
Calcium, Ion: 1.28 mmol/L (ref 1.15–1.40)
HCT: 39 % (ref 36.0–46.0)
Hemoglobin: 13.3 g/dL (ref 12.0–15.0)
O2 Saturation: 82 %
Potassium: 4 mmol/L (ref 3.5–5.1)
Sodium: 140 mmol/L (ref 135–145)
TCO2: 32 mmol/L (ref 22–32)
pCO2 arterial: 60.3 mmHg — ABNORMAL HIGH (ref 32.0–48.0)
pH, Arterial: 7.303 — ABNORMAL LOW (ref 7.350–7.450)
pO2, Arterial: 52 mmHg — ABNORMAL LOW (ref 83.0–108.0)

## 2019-05-05 LAB — POCT I-STAT, CHEM 8
BUN: 22 mg/dL — ABNORMAL HIGH (ref 6–20)
Calcium, Ion: 1.05 mmol/L — ABNORMAL LOW (ref 1.15–1.40)
Chloride: 110 mmol/L (ref 98–111)
Creatinine, Ser: 0.5 mg/dL (ref 0.44–1.00)
Glucose, Bld: 177 mg/dL — ABNORMAL HIGH (ref 70–99)
HCT: 34 % — ABNORMAL LOW (ref 36.0–46.0)
Hemoglobin: 11.6 g/dL — ABNORMAL LOW (ref 12.0–15.0)
Potassium: 4.6 mmol/L (ref 3.5–5.1)
Sodium: 142 mmol/L (ref 135–145)
TCO2: 22 mmol/L (ref 22–32)

## 2019-05-05 LAB — COMPREHENSIVE METABOLIC PANEL
ALT: 27 U/L (ref 0–44)
AST: 37 U/L (ref 15–41)
Albumin: 3.2 g/dL — ABNORMAL LOW (ref 3.5–5.0)
Alkaline Phosphatase: 111 U/L (ref 38–126)
Anion gap: 14 (ref 5–15)
BUN: 14 mg/dL (ref 6–20)
CO2: 24 mmol/L (ref 22–32)
Calcium: 8.8 mg/dL — ABNORMAL LOW (ref 8.9–10.3)
Chloride: 101 mmol/L (ref 98–111)
Creatinine, Ser: 0.69 mg/dL (ref 0.44–1.00)
GFR calc Af Amer: >60 mL/min (ref >60)
GFR calc non Af Amer: >60 mL/min (ref >60)
Glucose, Bld: 175 mg/dL — ABNORMAL HIGH (ref 70–99)
Potassium: 4 mmol/L (ref 3.5–5.1)
Sodium: 139 mmol/L (ref 135–145)
Total Bilirubin: 0.5 mg/dL (ref 0.3–1.2)
Total Protein: 8 g/dL (ref 6.5–8.1)

## 2019-05-05 LAB — GLUCOSE, CAPILLARY
Glucose-Capillary: 183 mg/dL — ABNORMAL HIGH (ref 70–99)
Glucose-Capillary: 187 mg/dL — ABNORMAL HIGH (ref 70–99)
Glucose-Capillary: 253 mg/dL — ABNORMAL HIGH (ref 70–99)

## 2019-05-05 LAB — CBC WITH DIFFERENTIAL/PLATELET
Abs Immature Granulocytes: 0.08 10*3/uL — ABNORMAL HIGH (ref 0.00–0.07)
Basophils Absolute: 0 10*3/uL (ref 0.0–0.1)
Basophils Relative: 0 %
Eosinophils Absolute: 0 10*3/uL (ref 0.0–0.5)
Eosinophils Relative: 0 %
HCT: 40.5 % (ref 36.0–46.0)
Hemoglobin: 13 g/dL (ref 12.0–15.0)
Immature Granulocytes: 1 %
Lymphocytes Relative: 16 %
Lymphs Abs: 1.1 10*3/uL (ref 0.7–4.0)
MCH: 27.3 pg (ref 26.0–34.0)
MCHC: 32.1 g/dL (ref 30.0–36.0)
MCV: 84.9 fL (ref 80.0–100.0)
Monocytes Absolute: 0.2 10*3/uL (ref 0.1–1.0)
Monocytes Relative: 3 %
Neutro Abs: 5.8 10*3/uL (ref 1.7–7.7)
Neutrophils Relative %: 80 %
Platelets: 303 10*3/uL (ref 150–400)
RBC: 4.77 MIL/uL (ref 3.87–5.11)
RDW: 13.7 % (ref 11.5–15.5)
WBC: 7.2 10*3/uL (ref 4.0–10.5)
nRBC: 0.3 % — ABNORMAL HIGH (ref 0.0–0.2)

## 2019-05-05 LAB — MRSA PCR SCREENING: MRSA by PCR: NEGATIVE

## 2019-05-05 LAB — D-DIMER, QUANTITATIVE: D-Dimer, Quant: >20 ug/mL-FEU — ABNORMAL HIGH (ref 0.00–0.50)

## 2019-05-05 LAB — PREPARE FRESH FROZEN PLASMA: Unit division: 0

## 2019-05-05 LAB — C-REACTIVE PROTEIN: CRP: 15.3 mg/dL — ABNORMAL HIGH (ref <1.0)

## 2019-05-05 LAB — APTT: aPTT: 46 seconds — ABNORMAL HIGH (ref 24–36)

## 2019-05-05 LAB — PHOSPHORUS: Phosphorus: 5.3 mg/dL — ABNORMAL HIGH (ref 2.5–4.6)

## 2019-05-05 LAB — MAGNESIUM: Magnesium: 2.3 mg/dL (ref 1.7–2.4)

## 2019-05-05 MED ORDER — INSULIN ASPART 100 UNIT/ML ~~LOC~~ SOLN
2.0000 [IU] | SUBCUTANEOUS | Status: DC
  Administered 2019-05-05 – 2019-05-10 (×27): 2 [IU] via SUBCUTANEOUS

## 2019-05-05 MED ORDER — HEPARIN (PORCINE) 25000 UT/250ML-% IV SOLN
1350.0000 [IU]/h | INTRAVENOUS | Status: DC
  Administered 2019-05-05: 1150 [IU]/h via INTRAVENOUS
  Filled 2019-05-05: qty 250

## 2019-05-05 MED ORDER — PROPOFOL 1000 MG/100ML IV EMUL
0.0000 ug/kg/min | INTRAVENOUS | Status: DC
  Administered 2019-05-06 (×2): 20 ug/kg/min via INTRAVENOUS
  Filled 2019-05-05 (×2): qty 100

## 2019-05-05 MED ORDER — MIDAZOLAM HCL 2 MG/2ML IJ SOLN: 2.0000 mg | Freq: Once | INTRAMUSCULAR | Status: AC

## 2019-05-05 MED ORDER — ORAL CARE MOUTH RINSE
15.0000 mL | OROMUCOSAL | Status: DC
  Administered 2019-05-05 – 2019-05-17 (×117): 15 mL via OROMUCOSAL

## 2019-05-05 MED ORDER — ALTEPLASE (PULMONARY EMBOLISM) INFUSION
50.0000 mg | Freq: Once | INTRAVENOUS | Status: DC
  Filled 2019-05-05: qty 50

## 2019-05-05 MED ORDER — MONTELUKAST SODIUM 10 MG PO TABS
10.0000 mg | ORAL_TABLET | Freq: Every day | ORAL | Status: DC
  Filled 2019-05-05: qty 1

## 2019-05-05 MED ORDER — ALTEPLASE 50 MG IV SOLR
50.0000 mg | Freq: Once | INTRAVENOUS | Status: AC
  Administered 2019-05-05: 50 mg via INTRAVENOUS
  Filled 2019-05-05: qty 50

## 2019-05-05 MED ORDER — INSULIN ASPART 100 UNIT/ML ~~LOC~~ SOLN
0.0000 [IU] | SUBCUTANEOUS | Status: DC
  Administered 2019-05-05: 11 [IU] via SUBCUTANEOUS
  Administered 2019-05-06 (×3): 4 [IU] via SUBCUTANEOUS
  Administered 2019-05-06 (×2): 3 [IU] via SUBCUTANEOUS
  Administered 2019-05-06: 4 [IU] via SUBCUTANEOUS
  Administered 2019-05-06: 7 [IU] via SUBCUTANEOUS
  Administered 2019-05-07 (×3): 4 [IU] via SUBCUTANEOUS
  Administered 2019-05-07: 3 [IU] via SUBCUTANEOUS
  Administered 2019-05-07: 4 [IU] via SUBCUTANEOUS
  Administered 2019-05-08 (×3): 7 [IU] via SUBCUTANEOUS
  Administered 2019-05-08: 4 [IU] via SUBCUTANEOUS
  Administered 2019-05-08: 7 [IU] via SUBCUTANEOUS
  Administered 2019-05-08: 4 [IU] via SUBCUTANEOUS
  Administered 2019-05-08: 12:00:00 7 [IU] via SUBCUTANEOUS
  Administered 2019-05-09 (×2): 4 [IU] via SUBCUTANEOUS
  Administered 2019-05-09 (×2): 7 [IU] via SUBCUTANEOUS
  Administered 2019-05-09 – 2019-05-10 (×2): 4 [IU] via SUBCUTANEOUS
  Administered 2019-05-10: 11 [IU] via SUBCUTANEOUS
  Administered 2019-05-10: 4 [IU] via SUBCUTANEOUS
  Administered 2019-05-10 (×2): 7 [IU] via SUBCUTANEOUS
  Administered 2019-05-10: 11 [IU] via SUBCUTANEOUS
  Administered 2019-05-11 (×2): 7 [IU] via SUBCUTANEOUS
  Administered 2019-05-11: 11 [IU] via SUBCUTANEOUS
  Administered 2019-05-11 (×2): 7 [IU] via SUBCUTANEOUS
  Administered 2019-05-11 (×2): 4 [IU] via SUBCUTANEOUS
  Administered 2019-05-12: 15 [IU] via SUBCUTANEOUS
  Administered 2019-05-12: 12:00:00 11 [IU] via SUBCUTANEOUS
  Administered 2019-05-12: 4 [IU] via SUBCUTANEOUS
  Administered 2019-05-12: 11 [IU] via SUBCUTANEOUS
  Administered 2019-05-12: 7 [IU] via SUBCUTANEOUS
  Administered 2019-05-13: 4 [IU] via SUBCUTANEOUS
  Administered 2019-05-13: 11 [IU] via SUBCUTANEOUS
  Administered 2019-05-13 (×2): 4 [IU] via SUBCUTANEOUS
  Administered 2019-05-13: 11 [IU] via SUBCUTANEOUS
  Administered 2019-05-13: 4 [IU] via SUBCUTANEOUS
  Administered 2019-05-14: 3 [IU] via SUBCUTANEOUS
  Administered 2019-05-14: 7 [IU] via SUBCUTANEOUS
  Administered 2019-05-14: 4 [IU] via SUBCUTANEOUS
  Administered 2019-05-14: 3 [IU] via SUBCUTANEOUS
  Administered 2019-05-14: 7 [IU] via SUBCUTANEOUS
  Administered 2019-05-14 – 2019-05-15 (×4): 4 [IU] via SUBCUTANEOUS

## 2019-05-05 MED ORDER — MIDAZOLAM HCL 2 MG/2ML IJ SOLN
INTRAMUSCULAR | Status: AC
  Administered 2019-05-05: 17:00:00 2 mg via INTRAVENOUS
  Filled 2019-05-05: qty 2

## 2019-05-05 MED ORDER — ROCURONIUM BROMIDE 10 MG/ML (PF) SYRINGE
PREFILLED_SYRINGE | INTRAVENOUS | Status: AC
  Administered 2019-05-05: 100 mg
  Filled 2019-05-05: qty 10

## 2019-05-05 MED ORDER — NOREPINEPHRINE 4 MG/250ML-% IV SOLN
INTRAVENOUS | Status: AC
  Administered 2019-05-05: 20 ug/min via INTRAVENOUS
  Filled 2019-05-05: qty 250

## 2019-05-05 MED ORDER — DEXTROSE 10 % IV SOLN: INTRAVENOUS | Status: DC

## 2019-05-05 MED ORDER — FENTANYL CITRATE (PF) 100 MCG/2ML IJ SOLN
INTRAMUSCULAR | Status: AC
  Administered 2019-05-05: 100 ug via INTRAVENOUS
  Filled 2019-05-05: qty 2

## 2019-05-05 MED ORDER — ETOMIDATE 2 MG/ML IV SOLN
INTRAVENOUS | Status: AC
  Administered 2019-05-05: 17:00:00 10 mg via INTRAVENOUS
  Filled 2019-05-05: qty 10

## 2019-05-05 MED ORDER — MIDAZOLAM HCL 2 MG/2ML IJ SOLN
2.0000 mg | INTRAMUSCULAR | Status: DC | PRN
  Filled 2019-05-05: qty 2

## 2019-05-05 MED ORDER — FENTANYL CITRATE (PF) 100 MCG/2ML IJ SOLN
50.0000 ug | INTRAMUSCULAR | Status: DC | PRN
  Administered 2019-05-05: 17:00:00 100 ug via INTRAVENOUS
  Filled 2019-05-05: qty 2

## 2019-05-05 MED ORDER — ATORVASTATIN CALCIUM 10 MG PO TABS: 20.0000 mg | ORAL_TABLET | Freq: Every day | ORAL | Status: DC

## 2019-05-05 MED ORDER — CHLORHEXIDINE GLUCONATE 0.12% ORAL RINSE (MEDLINE KIT)
15.0000 mL | Freq: Two times a day (BID) | OROMUCOSAL | Status: DC
  Administered 2019-05-05 – 2019-05-07 (×4): 15 mL via OROMUCOSAL

## 2019-05-05 MED ORDER — NEBIVOLOL HCL 10 MG PO TABS: 10.0000 mg | ORAL_TABLET | Freq: Every day | ORAL | Status: DC

## 2019-05-05 MED ORDER — BISACODYL 10 MG RE SUPP
10.0000 mg | Freq: Every day | RECTAL | Status: DC | PRN
  Filled 2019-05-05: qty 1

## 2019-05-05 MED ORDER — DOCUSATE SODIUM 50 MG/5ML PO LIQD
100.0000 mg | Freq: Two times a day (BID) | ORAL | Status: DC | PRN
  Administered 2019-05-05: 17:00:00 100 mg

## 2019-05-05 MED ORDER — ETOMIDATE 2 MG/ML IV SOLN: 10.0000 mg | Freq: Once | INTRAVENOUS | Status: AC

## 2019-05-05 MED ORDER — FENTANYL CITRATE (PF) 100 MCG/2ML IJ SOLN: 100.0000 ug | Freq: Once | INTRAMUSCULAR | Status: AC

## 2019-05-05 MED ORDER — FAMOTIDINE 20 MG PO TABS
20.0000 mg | ORAL_TABLET | Freq: Every day | ORAL | Status: DC
  Administered 2019-05-06 – 2019-05-11 (×6): 20 mg
  Filled 2019-05-05 (×6): qty 1

## 2019-05-05 MED ORDER — ACETAMINOPHEN 325 MG PO TABS
650.0000 mg | ORAL_TABLET | Freq: Four times a day (QID) | ORAL | Status: DC | PRN
  Administered 2019-05-08 – 2019-05-09 (×5): 650 mg
  Filled 2019-05-05 (×7): qty 2

## 2019-05-05 MED ORDER — ROCURONIUM BROMIDE 50 MG/5ML IV SOLN
80.0000 mg | Freq: Once | INTRAVENOUS | Status: AC
  Administered 2019-05-05: 80 mg via INTRAVENOUS

## 2019-05-05 MED ORDER — FAMOTIDINE 20 MG PO TABS: 20.0000 mg | ORAL_TABLET | Freq: Every day | ORAL | Status: DC

## 2019-05-05 MED ORDER — NOREPINEPHRINE 4 MG/250ML-% IV SOLN
0.0000 ug/min | INTRAVENOUS | Status: DC
  Administered 2019-05-05: 20 ug/min via INTRAVENOUS
  Administered 2019-05-06: 6 ug/min via INTRAVENOUS
  Filled 2019-05-05 (×2): qty 250

## 2019-05-05 MED ORDER — PROPOFOL 1000 MG/100ML IV EMUL
0.0000 ug/kg/min | INTRAVENOUS | Status: DC
  Administered 2019-05-05: 5 ug/kg/min via INTRAVENOUS
  Filled 2019-05-05: qty 100

## 2019-05-05 MED ORDER — INSULIN DETEMIR 100 UNIT/ML ~~LOC~~ SOLN
0.1500 [IU]/kg | Freq: Two times a day (BID) | SUBCUTANEOUS | Status: DC
  Administered 2019-05-06 – 2019-05-12 (×14): 19 [IU] via SUBCUTANEOUS
  Filled 2019-05-05 (×14): qty 0.19

## 2019-05-05 MED ORDER — PRO-STAT SUGAR FREE PO LIQD
30.0000 mL | Freq: Two times a day (BID) | ORAL | Status: DC
  Administered 2019-05-05: 21:00:00 30 mL
  Filled 2019-05-05: qty 30

## 2019-05-05 MED ORDER — SODIUM CHLORIDE 0.9 % IV SOLN: INTRAVENOUS | Status: DC

## 2019-05-05 MED ORDER — VITAL HIGH PROTEIN PO LIQD: 1000.0000 mL | ORAL | Status: DC

## 2019-05-05 MED ORDER — SERTRALINE HCL 50 MG PO TABS: 50.0000 mg | ORAL_TABLET | Freq: Every day | ORAL | Status: DC

## 2019-05-05 MED ORDER — LOSARTAN POTASSIUM 50 MG PO TABS: 50.0000 mg | ORAL_TABLET | Freq: Every day | ORAL | Status: DC

## 2019-05-05 MED FILL — Medication: Qty: 1 | Status: AC

## 2019-05-05 NOTE — Progress Notes (Signed)
RN in pt's room and pt is still refusing to wear ordered oxygen therapy.  Pt removes heated high flow O2 at this time.  RT comes to assess pt.  Pt quickly desaturates and becomes unresponsive and maintains a pulse as this time.  MD and response team alerted and coming to beside

## 2019-05-05 NOTE — Consult Note (Signed)
NAME:  [name], MRN:  [id number], DOB:  [birth year], LOS: 2 ADMISSION DATE:  2019/05/13, CONSULTATION DATE:  05/05/2019 REFERRING MD:  Dr. Bonner Puna, [facility], CHIEF COMPLAINT:  Short of breath   Brief History   44 yo female found to be positive for COVID 19 on 04/26/19 developed progressive dyspnea associated with lethargy.  In ER found to have hypoxia.  [facility].  Started on decadron and remdesivir.  Treated with tocilizumab and convalescent plasma.  Developed worsening hypoxia and mental status, and required intubation 05/05/19.  Past Medical History  Pre-DM, HTN, Asthma, Obesity  Significant Hospital Events   1/19 Admit 1/20 Transfer to ICU 1/21 VDRF, ARDS protocol  Consults:    Procedures:  ETT 1/21 >>   Significant Diagnostic Tests:    Micro Data:  SARS CoV 2 NAA 1/12 >> detected RVP 1/19 >> negative Sputm 1/21 >>   COVID 19 Therapy:  Remdesivir 1/19 >> Decadron 1/19 >> Tocilizumab 1/19 Convalescent plasma 1/20   Antimicrobial Therapy:    Interim history/subjective:    Objective   Blood pressure 137/72, pulse 72, temperature 98.3 F (36.8 C), temperature source Axillary, resp. rate (!) 30, height 5\' 2"  (1.575 m), weight 123.8 kg, last menstrual period 04/10/2019, SpO2 (!) 86 %.    FiO2 (%):  [100 %] 100 %   Intake/Output Summary (Last 24 hours) at 05/05/2019 1716 Last data filed at 05/05/2019 3009 Gross per 24 hour  Intake 870 ml  Output 350 ml  Net 520 ml   Filed Weights   05/13/19 2031 05/04/19 0538  Weight: 127 kg 123.8 kg    Examination:  General - obtunded Eyes - pupils reactive ENT - no stridor Cardiac - regular rate/rhythm, no murmur Chest - poor air movement, b/l crackles Abdomen - soft, non tender, + bowel sounds Extremities - 1+ edema Skin - no rashes Neuro - not following commands       Resolved Hospital Problem list     Assessment & Plan:  Acute hypoxic respiratory failure from COVID 19 pneumonia with  ARDS. - full vent support - f/u CXR, ABG - continue decadron, remdesivir  Best practice:    Labs   CBC: Recent Labs  Lab 05/01/19 1144 05/13/19 1842 05/04/19 0900 05/05/19 0526 05/05/19 1649  WBC 6.6 6.8 5.5 7.2  --   NEUTROABS  --  5.2 4.2 5.8  --   HGB 12.5 12.6 12.9 13.0 13.3  HCT 38.8 40.2 40.0 40.5 39.0  MCV 86.2 86.1 83.3 84.9  --   PLT 191 214 222 303  --     Basic Metabolic Panel: Recent Labs  Lab 05/01/19 1055 05-13-2019 1842 05/04/19 0900 05/05/19 0526 05/05/19 1649  NA 135 136 141 139 140  K 4.3 3.5 4.0 4.0 4.0  CL 103 100 106 101  --   CO2 19* 24 23 24   --   GLUCOSE 162* 164* 143* 175*  --   BUN 9 8 8 14   --   CREATININE 0.75 0.69 0.58 0.69  --   CALCIUM 8.7* 8.6* 8.8* 8.8*  --    GFR: Estimated Creatinine Clearance: 113.9 mL/min (by C-G formula based on SCr of 0.69 mg/dL). Recent Labs  Lab 05/01/19 1144 05/13/2019 1842 05/13/2019 2110 05/04/19 0900 05/05/19 0526  PROCALCITON  --  <0.10  --   --   --   WBC 6.6 6.8  --  5.5 7.2  LATICACIDVEN  --  2.1* 1.7  --   --  Liver Function Tests: Recent Labs  Lab 2019-05-31 1842 05/04/19 0900 05/05/19 0526  AST 36 32 37  ALT 28 27 27   ALKPHOS 103 106 111  BILITOT 0.4 0.4 0.5  PROT 7.5 7.6 8.0  ALBUMIN 3.0* 3.0* 3.2*   No results for input(s): LIPASE, AMYLASE in the last 168 hours. No results for input(s): AMMONIA in the last 168 hours.  ABG    Component Value Date/Time   PHART 7.303 (L) 05/05/2019 1649   PCO2ART 60.3 (H) 05/05/2019 1649   PO2ART 52.0 (L) 05/05/2019 1649   HCO3 29.8 (H) 05/05/2019 1649   TCO2 32 05/05/2019 1649   O2SAT 82.0 05/05/2019 1649     Coagulation Profile: No results for input(s): INR, PROTIME in the last 168 hours.  Cardiac Enzymes: No results for input(s): CKTOTAL, CKMB, CKMBINDEX, TROPONINI in the last 168 hours.  HbA1C: Hgb A1c MFr Bld  Date/Time Value Ref Range Status  May 31, 2019 09:10 PM 6.8 (H) 4.8 - 5.6 % Final    Comment:    (NOTE) Pre  diabetes:          5.7%-6.4% Diabetes:              >6.4% Glycemic control for   <7.0% adults with diabetes   01/28/2019 08:32 AM 6.9 (H) 4.6 - 6.5 % Final    Comment:    Glycemic Control Guidelines for People with Diabetes:Non Diabetic:  <6%Goal of Therapy: <7%Additional Action Suggested:  >8%     CBG: Recent Labs  Lab May 31, 2019 2140 05/04/19 0823 05/04/19 1233 05/04/19 1747 05/04/19 2110  GLUCAP 255* 148* 143* 197* 183*    Review of Systems:   Unable to obtain.  Past Medical History  She,  has a past medical history of Acne, Allergic rhinitis, Anxiety, Asthma, Blood in stool, Chronic bronchitis, Dyspnea, Dysrhythmia, GERD (gastroesophageal reflux disease), HLD (hyperlipidemia), Pneumonia, Pre-diabetes, and Seasonal allergies.   Surgical History    Past Surgical History:  Procedure Laterality Date  . ANAL FISSURE REPAIR N/A 05/23/2016   Procedure: Lateral Internal Sphincterotomy and Excision of Internal Tag;  Surgeon: 07/21/2016, MD;  Location: [facility]S;  Service: General;  Laterality: N/A;  . CHOLECYSTECTOMY    . CYST REMOVAL TRUNK     Bottom of spine  . HEMORROIDECTOMY    . TONSILLECTOMY       Social History   reports that she has never smoked. She has never used smokeless tobacco. She reports that she does not drink alcohol or use drugs.   Family History   Her family history includes Arthritis in an other family member; Diabetes in an other family member; Heart attack in her father; Hypertension in an other family member; Other in an other family member; Stroke in an other family member.   Allergies Allergies  Allergen Reactions  . Levalbuterol Shortness Of Breath  . Buspar [Buspirone Hcl]      Fainting feelings   . Sulfamethoxazole     REACTION: unspecified  . Sulfonamide Derivatives     REACTION: rash     Home Medications  Prior to Admission medications   Medication Sig Start Date End Date Taking? Authorizing Provider  albuterol (PROVENTIL) (2.5  MG/3ML) 0.083% nebulizer solution INHALE CONTENTS OF 1 VIAL VIA NEBULIZER EVERY 6 HOURS AS NEEDED FOR WHEEZING Patient taking differently: Take 2.5 mg by nebulization every 6 (six) hours as needed for wheezing or shortness of breath.  04/29/19  Yes 05/01/19, NP  albuterol (VENTOLIN HFA) 108 (90 Base) MCG/ACT  inhaler INHALE 2 PUFFS INTO THE LUNGS EVERY 6 HOURS AS NEEDED FOR WHEEZING OR SHORTNESS OF BREATH Patient taking differently: Inhale 2 puffs into the lungs every 6 (six) hours as needed for wheezing or shortness of breath.  03/24/19  Yes Doreene Nest, NP  atorvastatin (LIPITOR) 20 MG tablet TAKE 1 TABLET BY MOUTH DAILY FOR CHOLESTEROL Patient taking differently: Take 20 mg by mouth daily.  03/22/19  Yes Doreene Nest, NP  cetirizine (ZYRTEC) 10 MG tablet Take 10 mg by mouth daily.     Yes [provider]  losartan (COZAAR) 50 MG tablet Take 1 tablet (50 mg total) by mouth daily. For blood pressure. 03/23/19  Yes Doreene Nest, NP  MELATONIN PO Take 6 mg by mouth at bedtime as needed (sleep aid).    Yes [provider]  metFORMIN (GLUCOPHAGE) 500 MG tablet TAKE 1 TABLET(500 MG) BY MOUTH TWICE DAILY WITH A MEAL Patient taking differently: Take 500 mg by mouth 2 (two) times daily with a meal.  01/03/19  Yes Doreene Nest, NP  mometasone-formoterol (DULERA) 200-5 MCG/ACT AERO INHALE 1-2 PUFFS BY MOUTH twice daily. 04/19/19  Yes Doreene Nest, NP  montelukast (SINGULAIR) 10 MG tablet TAKE 1 TABLET BY MOUTH EVERY NIGHT AT BEDTIME Patient taking differently: Take 10 mg by mouth at bedtime.  01/31/19  Yes Doreene Nest, NP  nebivolol (BYSTOLIC) 10 MG tablet Take 1 tablet (10 mg total) by mouth daily. 01/03/19  Yes Doreene Nest, NP  sertraline (ZOLOFT) 50 MG tablet Take 1 tablet (50 mg total) by mouth daily. For anxiety. 02/09/19  Yes Doreene Nest, NP  SYEDA 3-0.03 MG tablet TAKE 1 TABLET BY MOUTH DAILY Patient taking differently: Take 1  tablet by mouth daily.  02/10/19  Yes Doreene Nest, NP     Critical care time: 39 minutes    Coralyn Helling, MD [facility] 05/05/2019, 5:26 PM

## 2019-05-05 NOTE — Progress Notes (Signed)
ANTICOAGULATION CONSULT NOTE - Initial Consult  Pharmacy Consult for heparin Indication: Suspect possible PE  Allergies  Allergen Reactions  . Levalbuterol Shortness Of Breath  . Buspar [Buspirone Hcl]      Fainting feelings   . Sulfamethoxazole     REACTION: unspecified  . Sulfonamide Derivatives     REACTION: rash    Patient Measurements: Height: '5\' 2"'  (157.5 cm) Weight: 273 lb (123.8 kg) IBW/kg (Calculated) : 50.1 Heparin Dosing Weight: 81kg  Vital Signs: Temp: 98.3 F (36.8 C) (01/21 1200) Temp Source: Axillary (01/21 1200) BP: 172/126 (01/21 1645) Pulse Rate: 120 (01/21 1645)  Labs: Recent Labs    05/13/2019 1842 05/08/2019 1842 05/04/19 0900 05/04/19 0900 05/05/19 0526 05/05/19 0526 05/05/19 1649 05/05/19 1732  HGB 12.6   < > 12.9   < > 13.0   < > 13.3 11.6*  HCT 40.2   < > 40.0   < > 40.5  --  39.0 34.0*  PLT 214  --  222  --  303  --   --   --   CREATININE 0.69   < > 0.58  --  0.69  --   --  0.50   < > = values in this interval not displayed.    Estimated Creatinine Clearance: 113.9 mL/min (by C-G formula based on SCr of 0.5 mg/dL).   Medical History: Past Medical History:  Diagnosis Date  . Acne   . Allergic rhinitis   . Anxiety   . Asthma   . Blood in stool   . Chronic bronchitis   . Dyspnea    At times  . Dysrhythmia    Tachycardia on bystolic for  . GERD (gastroesophageal reflux disease)   . HLD (hyperlipidemia)   . Pneumonia    hx of  . Pre-diabetes    On metformin preventively  . Seasonal allergies     Medications:  Medications Prior to Admission  Medication Sig Dispense Refill Last Dose  . albuterol (PROVENTIL) (2.5 MG/3ML) 0.083% nebulizer solution INHALE CONTENTS OF 1 VIAL VIA NEBULIZER EVERY 6 HOURS AS NEEDED FOR WHEEZING (Patient taking differently: Take 2.5 mg by nebulization every 6 (six) hours as needed for wheezing or shortness of breath. ) 150 mL 0 04/29/2019 at Unknown time  . albuterol (VENTOLIN HFA) 108 (90 Base)  MCG/ACT inhaler INHALE 2 PUFFS INTO THE LUNGS EVERY 6 HOURS AS NEEDED FOR WHEEZING OR SHORTNESS OF BREATH (Patient taking differently: Inhale 2 puffs into the lungs every 6 (six) hours as needed for wheezing or shortness of breath. ) 8.5 g 0 04/24/2019 at Unknown time  . atorvastatin (LIPITOR) 20 MG tablet TAKE 1 TABLET BY MOUTH DAILY FOR CHOLESTEROL (Patient taking differently: Take 20 mg by mouth daily. ) 90 tablet 1 05/15/2019 at Unknown time  . cetirizine (ZYRTEC) 10 MG tablet Take 10 mg by mouth daily.     05/09/2019 at Unknown time  . losartan (COZAAR) 50 MG tablet Take 1 tablet (50 mg total) by mouth daily. For blood pressure. 90 tablet 3 04/30/2019 at Unknown time  . MELATONIN PO Take 6 mg by mouth at bedtime as needed (sleep aid).    Past Week at Unknown time  . metFORMIN (GLUCOPHAGE) 500 MG tablet TAKE 1 TABLET(500 MG) BY MOUTH TWICE DAILY WITH A MEAL (Patient taking differently: Take 500 mg by mouth 2 (two) times daily with a meal. ) 180 tablet 1 04/30/2019 at Unknown time  . mometasone-formoterol (DULERA) 200-5 MCG/ACT AERO INHALE 1-2 PUFFS BY  MOUTH twice daily. 39 g 1 04/15/2019 at Unknown time  . montelukast (SINGULAIR) 10 MG tablet TAKE 1 TABLET BY MOUTH EVERY NIGHT AT BEDTIME (Patient taking differently: Take 10 mg by mouth at bedtime. ) 90 tablet 1 05/02/2019 at Unknown time  . nebivolol (BYSTOLIC) 10 MG tablet Take 1 tablet (10 mg total) by mouth daily. 90 tablet 1 05/11/2019 at 1100  . sertraline (ZOLOFT) 50 MG tablet Take 1 tablet (50 mg total) by mouth daily. For anxiety. 30 tablet 1 04/30/2019 at Unknown time  . SYEDA 3-0.03 MG tablet TAKE 1 TABLET BY MOUTH DAILY (Patient taking differently: Take 1 tablet by mouth daily. ) 84 tablet 2 04/29/2019 at Unknown time   Scheduled:  . alteplase  50 mg Intravenous Once  . chlorhexidine gluconate (MEDLINE KIT)  15 mL Mouth Rinse BID  . Chlorhexidine Gluconate Cloth  6 each Topical Daily  . dexamethasone (DECADRON) injection  6 mg Intravenous Q12H   . [START ON 05/06/2019] famotidine  20 mg Per Tube Daily  . feeding supplement (PRO-STAT SUGAR FREE 64)  30 mL Per Tube BID  . feeding supplement (VITAL HIGH PROTEIN)  1,000 mL Per Tube Q24H  . insulin aspart  0-20 Units Subcutaneous Q4H  . insulin aspart  2 Units Subcutaneous Q4H  . insulin detemir  0.15 Units/kg Subcutaneous BID  . mouth rinse  15 mL Mouth Rinse 10 times per day   Infusions:  . sodium chloride    . dextrose    . norepinephrine    . norepinephrine (LEVOPHED) Adult infusion    . remdesivir 100 mg in NS 100 mL 100 mg (05/05/19 6659)    Assessment: Pt was transferred to the ICU after decompensation. She is morbidly obese. She coded tonight and a suspicion for PE was suspected. She got tPA 41m pushed. He last dose of DVT prophylaxis Lovenox was last PM. We will start IV heparin once PTT<80 per Dr SHalford Chessman   Goal of Therapy:  Heparin level 0.3-0.5 x24 hr then 0.3-0.7 Monitor platelets by anticoagulation protocol: Yes   Plan:   Heparin when PTT<80 Dc lovenox  MOnnie Boer PharmD, BCIDP, AAHIVP, CPP Infectious Disease Pharmacist 05/05/2019 7:01 PM

## 2019-05-05 NOTE — Progress Notes (Signed)
ANTICOAGULATION CONSULT NOTE   Pharmacy Consult for heparin Indication: Suspected PE  Allergies  Allergen Reactions  . Levalbuterol Shortness Of Breath  . Buspar [Buspirone Hcl]      Fainting feelings   . Sulfamethoxazole     REACTION: unspecified  . Sulfonamide Derivatives     REACTION: rash    Patient Measurements: Height: 5\' 2"  (157.5 cm) Weight: 273 lb (123.8 kg) IBW/kg (Calculated) : 50.1 Heparin Dosing Weight: 81kg  Vital Signs: Temp: 98.6 F (37 C) (01/21 2030) Temp Source: Oral (01/21 2030) BP: 128/78 (01/21 2200) Pulse Rate: 84 (01/21 2200)  Labs: Recent Labs    05/04/2019 1842 04/16/2019 1842 05/04/19 0900 05/04/19 0900 05/05/19 0526 05/05/19 0526 05/05/19 1649 05/05/19 1732 05/05/19 2052  HGB 12.6   < > 12.9   < > 13.0   < > 13.3 11.6*  --   HCT 40.2   < > 40.0   < > 40.5  --  39.0 34.0*  --   PLT 214  --  222  --  303  --   --   --   --   APTT  --   --   --   --   --   --   --   --  46*  CREATININE 0.69   < > 0.58  --  0.69  --   --  0.50  --    < > = values in this interval not displayed.    Estimated Creatinine Clearance: 113.9 mL/min (by C-G formula based on SCr of 0.5 mg/dL).   Assessment: Pt was transferred to the ICU 1/21 after decompensation. She is morbidly obese. She coded tonight and PE was suspected. tPA 50mg  was pushed 1/21 1750. She got tPA 50mg  pushed. Plan to start IV heparin when PTT < 80 sec.  PTT 46 sec, Hgb down to 11.6, D-dimer > 20.  Goal of Therapy:  Heparin level 0.3-0.5 units/ml x24 hr then 0.3-0.7 units/ml Monitor platelets by anticoagulation protocol: Yes   Plan:  Begin heparin at 1150 units/hr. No bolus. Will f/u 6 hr heparin level  , PharmD, BCPS Please see amion for complete clinical pharmacist phone list 05/05/2019 11:28 PM

## 2019-05-05 NOTE — Procedures (Signed)
Intubation Procedure Note Jull Harral [id number] Jan 28, 1976  Procedure: Intubation Indications: Respiratory insufficiency  Procedure Details Consent: Unable to obtain consent because of altered level of consciousness. Time Out: Verified patient identification, verified procedure, site/side was marked, verified correct patient position, special equipment/implants available, medications/allergies/relevent history reviewed, required imaging and test results available.  Performed  Maximum sterile technique was used including cap, gloves, gown, hand hygiene and mask.  MAC and 4  Given 2 mg versed, 100 mcg fentanyl, 10 mg etomidate, 80 mg rocuronium.  Inserted ETT to 22 cm at lip using video laryngoscope.  Confirmed with CO2 detector and ausculation.  Evaluation Hemodynamic Status: BP stable throughout; O2 sats: currently acceptable Patient's Current Condition: stable Complications: No apparent complications Patient did tolerate procedure well. Chest X-ray ordered to verify placement.  CXR: pending.   Yordy Matton 05/05/2019

## 2019-05-05 NOTE — Progress Notes (Signed)
Pt developed sudden bradycardia, hypotension.  Skin color became cyanotic appearance.  SpO2 dropped to < 10%.  She received CPR for 6 minutes with 2 rounds of epi.  High clinical suspicion for acute PE.  Empirically given 50 mg alteplase over 2 minutes since benefit outweighs risk given emergency, life threatening situation.  Getting IV fluid bolus and started on levophed.  Developed tachycardia with A fib.  Had improvement in BP.  SpO2 improved only to 30's.  Dr. Jarvis Newcomer d/w pt's husband.  Okay to continue current measures, but DNR if she develops cardiac arrest.  Significant concern that she will has anoxic brain injury if she is able to survive.  Additional CC time 60 minutes.  Coralyn Helling, MD [facility] 05/05/2019, 6:30 PM

## 2019-05-05 NOTE — Plan of Care (Signed)
  Problem: Education: Goal: Knowledge of risk factors and measures for prevention of condition will improve Outcome: Progressing   Problem: Coping: Goal: Psychosocial and spiritual needs will be supported Outcome: Progressing   Problem: Respiratory: Goal: Will maintain a patent airway Outcome: Progressing Goal: Complications related to the disease process, condition or treatment will be avoided or minimized Outcome: Progressing   

## 2019-05-05 NOTE — Progress Notes (Signed)
eLink Physician-Brief Progress Note Patient Name: [name] DOB: [birth year] MRN: [id number]   Date of Service  05/05/2019  HPI/Events of Note  Pt recently intubated, needed orders for propofol  eICU Interventions  Propofol orders within order set entered with corresponding labs     Intervention Category Intermediate Interventions: Other:  Danelle Berry 05/05/2019, 11:06 PM

## 2019-05-05 NOTE — Progress Notes (Signed)
PROGRESS NOTE  [name]  QIO:[id number] DOB: 26-Feb-1976 DOA: 05-11-2019 PCP: Pleas Koch, NP   Brief Narrative: [name] is a 44 y.o. female with a history of HTN, obesity, asthma, T2DM who tested positive for covid-19 on 1/12, presented lethargic from home with severe hypoxemia found to have advancing diffuse bilateral infiltrates on CXR. Remdesivir, tocilizumab, and steroids given and patient admitted to [facility]. Convalescent plasma transfused 1/20. With progressive hypoxemia, patient was transferred to ICU on heated high flow oxygen.  Assessment & Plan: Principal Problem:   Acute respiratory distress syndrome (ARDS) due to COVID-19 virus [facility]) Active Problems:   Controlled type 2 diabetes mellitus without complication, without long-term current use of insulin (HCC)   Essential hypertension   Acute respiratory failure with hypoxia (HCC)  Acute hypoxemic respiratory failure due to covid-19 pneumonia on chronic asthma: SARS-CoV-2 PCR positive on 1/12, admitted 1/19. No leukocytosis, PCT elevation, and d-dimer very modestly elevated not suggestive of alternative causes of hypoxemia. - Repeat CXR 1/20 personally reviewed, remains bilateral airspace opacities though these have not advanced considerably in the interval. Will repeat CXR in AM. - Continue HHFNC, currently 40LPM + 15L NRB SpO2 83-88% at rest without respiratory distress this AM.  - Continue remdesivir x5 days 1/19 - 1/23 - Steroids x10 days, increased dose to 6mg  IV q12h on 1/20. - Given tocilizumab 1/19. Will monitor for secondary infections.  - Given covid convalescent plasma 1/20. - Vitamin C, zinc - Encourage OOB, IS, FV, and awake proning if able - Tylenol and antitussives prn - Continue airborne, contact precautions while admitted. Isolation period would be recommended for 21 days from positive testing. - Check CBC w/diff, CMP, CRP daily - Enoxaparin weight-based prophylactic dose.  - Maintain euvolemia/net  negative.  - Avoid NSAIDs  - Continue singulair  T2DM: HbA1c 6.8% indicating good chronic control.  - SSI AC/HS - Carb-modified diet  HTN:  - Hold BB if bradycardic  Obesity: BMI 49.93. Noted.  Depression/anxiety:  - Continue zoloft - Restarted valium (a remote medication for the patient) due to situational worsening of anxiety which has shown improvement without sedation  DVT prophylaxis: Lovenox 0.5mg /kg q24h Code Status: Full Family Communication: None at bedside. Husband by phone today. Disposition Plan: Continue ICU level of care. Despite improvement in inflammatory markers, hypoxemia remains severe and her risk factors suggest the possibility of worsening currently on day 9 since positive testing.  Consultants:   Discussed with PCCM without formal consultation.  Procedures:   None  Antimicrobials:  Remdesivir 1/19 - 1/23  Subjective: Gets severely dyspneic with any exertion, moderate at rest but able to eat some breakfast. Gets up to bedside chair and feels her cough and dyspnea is worse when trying to prone. No chest pain. Denies current anxiety.   Objective: Vitals:   05/05/19 0800 05/05/19 1000 05/05/19 1200 05/05/19 1214  BP: 127/77 132/70 137/72 137/72  Pulse: 63 63 61 72  Resp: (!) 40 (!) 36 (!) 24 (!) 30  Temp: 98.3 F (36.8 C) 98.8 F (37.1 C) 98.3 F (36.8 C)   TempSrc: Oral Oral Axillary   SpO2: (!) 86% 94% 91% (!) 86%  Weight:      Height:        Intake/Output Summary (Last 24 hours) at 05/05/2019 1440 Last data filed at 05/05/2019 0952 Gross per 24 hour  Intake 1246.67 ml  Output 350 ml  Net 896.67 ml   Filed Weights   05/11/19 2031 05/04/19 0538  Weight: 127 kg 123.8  kg   Gen: 44 y.o. female in no acute distress Pulm: Tachypneic without accessory muscle use on 40LPM HHF + NRB. Distant crackles without wheezes bilaterally. CV: Regular rate and rhythm. No murmur, rub, or gallop. No JVD, no pitting dependent edema. GI: Abdomen soft,  non-tender, non-distended, with normoactive bowel sounds.  Ext: Warm, no deformities Skin: No rashes, lesions or ulcers on visualized skin. Neuro: Alert and oriented. No focal neurological deficits. Psych: Judgement and insight appear fair. Mood euthymic & affect broad. Behavior is appropriate.    Data Reviewed: I have personally reviewed following labs and imaging studies  CBC: Recent Labs  Lab 05/01/19 1144 05/11/2019 1842 05/04/19 0900 05/05/19 0526  WBC 6.6 6.8 5.5 7.2  NEUTROABS  --  5.2 4.2 5.8  HGB 12.5 12.6 12.9 13.0  HCT 38.8 40.2 40.0 40.5  MCV 86.2 86.1 83.3 84.9  PLT 191 214 222 303   Basic Metabolic Panel: Recent Labs  Lab 05/01/19 1055 May 11, 2019 1842 05/04/19 0900 05/05/19 0526  NA 135 136 141 139  K 4.3 3.5 4.0 4.0  CL 103 100 106 101  CO2 19* 24 23 24   GLUCOSE 162* 164* 143* 175*  BUN 9 8 8 14   CREATININE 0.75 0.69 0.58 0.69  CALCIUM 8.7* 8.6* 8.8* 8.8*   GFR: Estimated Creatinine Clearance: 113.9 mL/min (by C-G formula based on SCr of 0.69 mg/dL). Liver Function Tests: Recent Labs  Lab 05-11-2019 1842 05/04/19 0900 05/05/19 0526  AST 36 32 37  ALT 28 27 27   ALKPHOS 103 106 111  BILITOT 0.4 0.4 0.5  PROT 7.5 7.6 8.0  ALBUMIN 3.0* 3.0* 3.2*   No results for input(s): LIPASE, AMYLASE in the last 168 hours. No results for input(s): AMMONIA in the last 168 hours. Coagulation Profile: No results for input(s): INR, PROTIME in the last 168 hours. Cardiac Enzymes: No results for input(s): CKTOTAL, CKMB, CKMBINDEX, TROPONINI in the last 168 hours. BNP (last 3 results) No results for input(s): PROBNP in the last 8760 hours. HbA1C: Recent Labs    05/11/19 2110  HGBA1C 6.8*   CBG: Recent Labs  Lab 05/11/19 2140 05/04/19 0823 05/04/19 1233 05/04/19 1747 05/04/19 2110  GLUCAP 255* 148* 143* 197* 183*   Lipid Profile: Recent Labs    05-11-19 1842  TRIG 676*   Thyroid Function Tests: No results for input(s): TSH, T4TOTAL, FREET4, T3FREE,  THYROIDAB in the last 72 hours. Anemia Panel: Recent Labs    2019-05-11 1842  FERRITIN 70   Urine analysis:    Component Value Date/Time   COLORURINE STRAW (A) 04/08/2019 1137   APPEARANCEUR HAZY (A) 04/08/2019 1137   LABSPEC 1.003 (L) 04/08/2019 1137   PHURINE 6.0 04/08/2019 1137   GLUCOSEU NEGATIVE 04/08/2019 1137   HGBUR NEGATIVE 04/08/2019 1137   BILIRUBINUR NEGATIVE 04/08/2019 1137   BILIRUBINUR Negative 08/24/2018 0836   KETONESUR NEGATIVE 04/08/2019 1137   PROTEINUR NEGATIVE 04/08/2019 1137   UROBILINOGEN 0.2 08/24/2018 0836   NITRITE NEGATIVE 04/08/2019 1137   LEUKOCYTESUR TRACE (A) 04/08/2019 1137   Recent Results (from the past 240 hour(s))  Novel Coronavirus, NAA ([facility])     Status: Abnormal   Collection Time: 04/26/19  9:33 AM   Specimen: Nasopharyngeal(NP) swabs in vial transport medium   NASOPHARYNGE  TESTING  Result Value Ref Range Status   SARS-CoV-2, NAA Detected (A) Not Detected Final    Comment: This nucleic acid amplification test was developed and its performance characteristics determined by 04/10/2019. Nucleic acid amplification tests include PCR  and TMA. This test has not been FDA cleared or approved. This test has been authorized by FDA under an Emergency Use Authorization (EUA). This test is only authorized for the duration of time the declaration that circumstances exist justifying the authorization of the emergency use of in vitro diagnostic tests for detection of SARS-CoV-2 virus and/or diagnosis of COVID-19 infection under section 564(b)(1) of the Act, 21 U.S.C. 818EXH-3(Z) (1), unless the authorization is terminated or revoked sooner. When diagnostic testing is negative, the possibility of a false negative result should be considered in the context of a patient's recent exposures and the presence of clinical signs and symptoms consistent with COVID-19. An individual without symptoms of COVID-19 and who is not shedding SARS-CoV-2  virus would  expect to have a negative (not detected) result in this assay.   Blood Culture (routine x 2)     Status: None (Preliminary result)   Collection Time: 13-May-2019  6:42 PM   Specimen: BLOOD  Result Value Ref Range Status   Specimen Description   Final    BLOOD LEFT ANTECUBITAL Performed at [facility], [address], [place], [place] [address]    Special Requests   Final    BOTTLES DRAWN AEROBIC AND ANAEROBIC Blood Culture adequate volume Performed at [facility], [address] [place], [place] [address]    Culture   Final    NO GROWTH 2 DAYS Performed at [facility], [address] [place], [place] [address]    Report Status PENDING  Incomplete  Blood Culture (routine x 2)     Status: None (Preliminary result)   Collection Time: May 13, 2019  7:00 PM   Specimen: BLOOD  Result Value Ref Range Status   Specimen Description   Final    BLOOD RIGHT ANTECUBITAL Performed at [facility], [address], [place], [place] [address]    Special Requests   Final    BOTTLES DRAWN AEROBIC AND ANAEROBIC Blood Culture adequate volume Performed at [facility], [address], [place], [place] [address]    Culture   Final    NO GROWTH 2 DAYS Performed at [facility], [address] [place], [place] [address]    Report Status PENDING  Incomplete  Respiratory Panel by PCR     Status: None   Collection Time: 13-May-2019  9:10 PM   Specimen: Nasopharyngeal Swab; Respiratory  Result Value Ref Range Status   Adenovirus NOT DETECTED NOT DETECTED Final   Coronavirus 229E NOT DETECTED NOT DETECTED Final    Comment: (NOTE) The Coronavirus on the Respiratory Panel, DOES NOT test for the novel  Coronavirus (2019 nCoV)    Coronavirus HKU1 NOT DETECTED NOT DETECTED Final   Coronavirus NL63 NOT DETECTED NOT DETECTED Final   Coronavirus OC43 NOT DETECTED NOT DETECTED Final   Metapneumovirus NOT  DETECTED NOT DETECTED Final   Rhinovirus / Enterovirus NOT DETECTED NOT DETECTED Final   Influenza A NOT DETECTED NOT DETECTED Final   Influenza B NOT DETECTED NOT DETECTED Final   Parainfluenza Virus 1 NOT DETECTED NOT DETECTED Final   Parainfluenza Virus 2 NOT DETECTED NOT DETECTED Final   Parainfluenza Virus 3 NOT DETECTED NOT DETECTED Final   Parainfluenza Virus 4 NOT DETECTED NOT DETECTED Final   Respiratory Syncytial Virus NOT DETECTED NOT DETECTED Final   Bordetella pertussis NOT DETECTED NOT DETECTED Final   Chlamydophila pneumoniae NOT DETECTED NOT DETECTED Final   Mycoplasma pneumoniae NOT  DETECTED NOT DETECTED Final    Comment: Performed at [facility], [address], [place], [place] [address]  MRSA PCR Screening     Status: None   Collection Time: 05/04/19 10:31 PM   Specimen: Nasal Mucosa; Nasopharyngeal  Result Value Ref Range Status   MRSA by PCR NEGATIVE NEGATIVE Final    Comment:        The GeneXpert MRSA Assay (FDA approved for NASAL specimens only), is one component of a comprehensive MRSA colonization surveillance program. It is not intended to diagnose MRSA infection nor to guide or monitor treatment for MRSA infections. Performed at [facility], [address], [place], [place] [address]       Radiology Studies: DG CHEST PORT 1 VIEW  Result Date: 05/04/2019 CLINICAL DATA:  Acute respiratory failure due to severe acute respiratory syndrome coronavirus 2. EXAM: PORTABLE CHEST 1 VIEW COMPARISON:  05/07/19. FINDINGS: Stable cardiomediastinal silhouette. No pneumothorax pleural effusion is noted. Stable bilateral diffuse lung opacities are noted concerning for pneumonia. Bony thorax is unremarkable. IMPRESSION: Stable bilateral diffuse lung opacities are noted consistent with multifocal pneumonia. Electronically Signed   By: Lupita Raider M.D.   On: 05/04/2019 14:57   DG Chest Port 1 View  Result Date: 05-07-2019 CLINICAL  DATA:  Worsening shortness of breath. EXAM: PORTABLE CHEST 1 VIEW COMPARISON:  May 01, 2019 FINDINGS: Moderate severity diffuse bilateral infiltrates are seen. This is increased in severity when compared to the prior study. There is no evidence of a pleural effusion or pneumothorax. The heart size and mediastinal contours are within normal limits. The visualized skeletal structures are unremarkable. IMPRESSION: 1. Moderate severity diffuse bilateral infiltrates, increased in severity when compared to the prior chest plain film dated May 01, 2019. Electronically Signed   By: Aram Candela M.D.   On: 05-07-19 18:28    Scheduled Meds:  atorvastatin  20 mg Oral Daily   Chlorhexidine Gluconate Cloth  6 each Topical Daily   dexamethasone (DECADRON) injection  6 mg Intravenous Q12H   enoxaparin (LOVENOX) injection  65 mg Subcutaneous Q24H   insulin aspart  0-15 Units Subcutaneous TID WC   insulin aspart  0-5 Units Subcutaneous QHS   losartan  50 mg Oral Daily   mouth rinse  15 mL Mouth Rinse BID   mometasone-formoterol  2 puff Inhalation BID   montelukast  10 mg Oral QHS   nebivolol  10 mg Oral Daily   sertraline  50 mg Oral Daily   Continuous Infusions:  remdesivir 100 mg in NS 100 mL 100 mg (05/05/19 0952)     LOS: 2 days   Time spent: 35 minutes.  Tyrone Nine, MD [facility] [email or web address] 05/05/2019, 2:40 PM

## 2019-05-05 NOTE — Progress Notes (Signed)
Dr. Jarvis Newcomer has been notified that pt is refusing to wear NRM over heated high flow.  She said "it's not working" and she will not wear it.  She has been made aware that she may need intubation if her O2 sats remain low and she does not want intubation.  At this time her O2 sats are mid 70s (75% on 40 L 100%).  She has been educated on risks/benefits. RN will inform CN and RT.  RN will continue to monitor/assess pt

## 2019-05-05 NOTE — Progress Notes (Signed)
Upon RT entrance to room pt refusing to wear NRB. RN and RT tried to encourage pt use in which she then took off the heated high flow nasal cannula. Pt SpO2 dropped quickly, NRB applied to pt and pt went unresponsive but still had a pulse. Assistance arrived to room and pt was transitioned from the bedside chair to the bed for intubation. Dr. Craige Cotta and Dr. Jarvis Newcomer at bedside. RT will continue to monitor.

## 2019-05-06 ENCOUNTER — Inpatient Hospital Stay (HOSPITAL_COMMUNITY): Payer: Managed Care, Other (non HMO)

## 2019-05-06 ENCOUNTER — Inpatient Hospital Stay: Payer: Self-pay

## 2019-05-06 DIAGNOSIS — E119 Type 2 diabetes mellitus without complications: Secondary | ICD-10-CM

## 2019-05-06 DIAGNOSIS — R0602 Shortness of breath: Secondary | ICD-10-CM

## 2019-05-06 LAB — CBC WITH DIFFERENTIAL/PLATELET
Abs Immature Granulocytes: 0.17 10*3/uL — ABNORMAL HIGH (ref 0.00–0.07)
Basophils Absolute: 0 10*3/uL (ref 0.0–0.1)
Basophils Relative: 0 %
Eosinophils Absolute: 0 10*3/uL (ref 0.0–0.5)
Eosinophils Relative: 0 %
HCT: 37.3 % (ref 36.0–46.0)
Hemoglobin: 11.8 g/dL — ABNORMAL LOW (ref 12.0–15.0)
Immature Granulocytes: 1 %
Lymphocytes Relative: 17 %
Lymphs Abs: 2 10*3/uL (ref 0.7–4.0)
MCH: 27 pg (ref 26.0–34.0)
MCHC: 31.6 g/dL (ref 30.0–36.0)
MCV: 85.4 fL (ref 80.0–100.0)
Monocytes Absolute: 0.5 10*3/uL (ref 0.1–1.0)
Monocytes Relative: 5 %
Neutro Abs: 9.1 10*3/uL — ABNORMAL HIGH (ref 1.7–7.7)
Neutrophils Relative %: 77 %
Platelets: 179 10*3/uL (ref 150–400)
RBC: 4.37 MIL/uL (ref 3.87–5.11)
RDW: 13.9 % (ref 11.5–15.5)
WBC: 11.8 10*3/uL — ABNORMAL HIGH (ref 4.0–10.5)
nRBC: 0.2 % (ref 0.0–0.2)

## 2019-05-06 LAB — D-DIMER, QUANTITATIVE: D-Dimer, Quant: >20 ug/mL-FEU — ABNORMAL HIGH (ref 0.00–0.50)

## 2019-05-06 LAB — COMPREHENSIVE METABOLIC PANEL
ALT: 58 U/L — ABNORMAL HIGH (ref 0–44)
AST: 66 U/L — ABNORMAL HIGH (ref 15–41)
Albumin: 2.8 g/dL — ABNORMAL LOW (ref 3.5–5.0)
Alkaline Phosphatase: 120 U/L (ref 38–126)
Anion gap: 13 (ref 5–15)
BUN: 18 mg/dL (ref 6–20)
CO2: 26 mmol/L (ref 22–32)
Calcium: 7.9 mg/dL — ABNORMAL LOW (ref 8.9–10.3)
Chloride: 102 mmol/L (ref 98–111)
Creatinine, Ser: 0.67 mg/dL (ref 0.44–1.00)
GFR calc Af Amer: >60 mL/min (ref >60)
GFR calc non Af Amer: >60 mL/min (ref >60)
Glucose, Bld: 160 mg/dL — ABNORMAL HIGH (ref 70–99)
Potassium: 3.7 mmol/L (ref 3.5–5.1)
Sodium: 141 mmol/L (ref 135–145)
Total Bilirubin: 0.6 mg/dL (ref 0.3–1.2)
Total Protein: 6.5 g/dL (ref 6.5–8.1)

## 2019-05-06 LAB — MAGNESIUM
Magnesium: 2.1 mg/dL (ref 1.7–2.4)
Magnesium: 2.6 mg/dL — ABNORMAL HIGH (ref 1.7–2.4)

## 2019-05-06 LAB — PROTIME-INR
INR: 1.7 — ABNORMAL HIGH (ref 0.8–1.2)
Prothrombin Time: 20 seconds — ABNORMAL HIGH (ref 11.4–15.2)

## 2019-05-06 LAB — GLUCOSE, CAPILLARY
Glucose-Capillary: 125 mg/dL — ABNORMAL HIGH (ref 70–99)
Glucose-Capillary: 146 mg/dL — ABNORMAL HIGH (ref 70–99)
Glucose-Capillary: 155 mg/dL — ABNORMAL HIGH (ref 70–99)
Glucose-Capillary: 160 mg/dL — ABNORMAL HIGH (ref 70–99)
Glucose-Capillary: 163 mg/dL — ABNORMAL HIGH (ref 70–99)
Glucose-Capillary: 191 mg/dL — ABNORMAL HIGH (ref 70–99)
Glucose-Capillary: 203 mg/dL — ABNORMAL HIGH (ref 70–99)

## 2019-05-06 LAB — PHOSPHORUS
Phosphorus: 3.2 mg/dL (ref 2.5–4.6)
Phosphorus: 4.9 mg/dL — ABNORMAL HIGH (ref 2.5–4.6)

## 2019-05-06 LAB — C-REACTIVE PROTEIN: CRP: 6.7 mg/dL — ABNORMAL HIGH (ref <1.0)

## 2019-05-06 LAB — ECHOCARDIOGRAM COMPLETE
Height: 62 in
Weight: 4368 oz

## 2019-05-06 LAB — APTT: aPTT: 28 seconds (ref 24–36)

## 2019-05-06 LAB — TRIGLYCERIDES: Triglycerides: 939 mg/dL — ABNORMAL HIGH (ref <150)

## 2019-05-06 LAB — HEPARIN LEVEL (UNFRACTIONATED): Heparin Unfractionated: 0.14 IU/mL — ABNORMAL LOW (ref 0.30–0.70)

## 2019-05-06 MED ORDER — VITAL AF 1.2 CAL PO LIQD
1000.0000 mL | ORAL | Status: DC
  Administered 2019-05-06: 1000 mL

## 2019-05-06 MED ORDER — PIPERACILLIN-TAZOBACTAM 3.375 G IVPB 30 MIN
3.3750 g | Freq: Once | INTRAVENOUS | Status: AC
  Administered 2019-05-06: 16:00:00 3.375 g via INTRAVENOUS
  Filled 2019-05-06: qty 50

## 2019-05-06 MED ORDER — CLONAZEPAM 0.1 MG/ML ORAL SUSPENSION
1.0000 mg | Freq: Two times a day (BID) | ORAL | Status: DC
  Filled 2019-05-06: qty 10

## 2019-05-06 MED ORDER — HEPARIN (PORCINE) 25000 UT/250ML-% IV SOLN
1500.0000 [IU]/h | INTRAVENOUS | Status: DC
  Administered 2019-05-06 – 2019-05-07 (×2): 1350 [IU]/h via INTRAVENOUS
  Administered 2019-05-08 – 2019-05-11 (×5): 1500 [IU]/h via INTRAVENOUS
  Filled 2019-05-06 (×7): qty 250

## 2019-05-06 MED ORDER — FENTANYL CITRATE (PF) 100 MCG/2ML IJ SOLN
50.0000 ug | Freq: Once | INTRAMUSCULAR | Status: AC
  Administered 2019-05-06: 13:00:00 50 ug via INTRAVENOUS

## 2019-05-06 MED ORDER — SODIUM CHLORIDE 0.9 % IV SOLN
0.0000 ug/kg/min | INTRAVENOUS | Status: DC
  Administered 2019-05-06: 18:00:00 1 ug/kg/min via INTRAVENOUS
  Administered 2019-05-07 (×2): 3 ug/kg/min via INTRAVENOUS
  Administered 2019-05-07: 3.5 ug/kg/min via INTRAVENOUS
  Administered 2019-05-08: 2.5 ug/kg/min via INTRAVENOUS
  Filled 2019-05-06 (×8): qty 20

## 2019-05-06 MED ORDER — SERTRALINE HCL 50 MG PO TABS
50.0000 mg | ORAL_TABLET | Freq: Every day | ORAL | Status: DC
  Administered 2019-05-06 – 2019-05-17 (×12): 50 mg
  Filled 2019-05-06 (×12): qty 1

## 2019-05-06 MED ORDER — MIDAZOLAM HCL 2 MG/2ML IJ SOLN
2.0000 mg | INTRAMUSCULAR | Status: AC | PRN
  Administered 2019-05-06 (×3): 2 mg via INTRAVENOUS
  Filled 2019-05-06 (×3): qty 2

## 2019-05-06 MED ORDER — VECURONIUM BROMIDE 10 MG IV SOLR
10.0000 mg | INTRAVENOUS | Status: DC | PRN
  Administered 2019-05-06: 17:00:00 10 mg via INTRAVENOUS
  Filled 2019-05-06: qty 10

## 2019-05-06 MED ORDER — MIDAZOLAM BOLUS VIA INFUSION
1.0000 mg | INTRAVENOUS | Status: DC | PRN
  Administered 2019-05-07 (×2): 2 mg via INTRAVENOUS
  Filled 2019-05-06: qty 2

## 2019-05-06 MED ORDER — SODIUM CHLORIDE 0.9 % IV SOLN
250.0000 mL | Freq: Once | INTRAVENOUS | Status: AC
  Administered 2019-05-06: 250 mL via INTRAVENOUS

## 2019-05-06 MED ORDER — FENTANYL BOLUS VIA INFUSION
50.0000 ug | INTRAVENOUS | Status: DC | PRN
  Administered 2019-05-06 – 2019-05-07 (×5): 50 ug via INTRAVENOUS
  Filled 2019-05-06: qty 50

## 2019-05-06 MED ORDER — FENTANYL 2500MCG IN NS 250ML (10MCG/ML) PREMIX INFUSION
0.0000 ug/h | INTRAVENOUS | Status: DC
  Administered 2019-05-06: 13:00:00 50 ug/h via INTRAVENOUS
  Administered 2019-05-07: 12:00:00 250 ug/h via INTRAVENOUS
  Administered 2019-05-07: 02:00:00 200 ug/h via INTRAVENOUS
  Administered 2019-05-07: 23:00:00 250 ug/h via INTRAVENOUS
  Filled 2019-05-06 (×5): qty 250

## 2019-05-06 MED ORDER — OXYCODONE HCL 5 MG/5ML PO SOLN
5.0000 mg | Freq: Four times a day (QID) | ORAL | Status: DC
  Administered 2019-05-06 – 2019-05-12 (×24): 5 mg via ORAL
  Filled 2019-05-06 (×24): qty 5

## 2019-05-06 MED ORDER — ALTEPLASE (PULMONARY EMBOLISM) INFUSION
100.0000 mg | Freq: Once | INTRAVENOUS | Status: AC
  Administered 2019-05-06: 100 mg via INTRAVENOUS
  Filled 2019-05-06: qty 100

## 2019-05-06 MED ORDER — CLONAZEPAM 1 MG PO TABS
1.0000 mg | ORAL_TABLET | Freq: Two times a day (BID) | ORAL | Status: DC
  Administered 2019-05-06 – 2019-05-12 (×12): 1 mg
  Filled 2019-05-06 (×12): qty 1

## 2019-05-06 MED ORDER — CISATRACURIUM BOLUS VIA INFUSION
0.1000 mg/kg | Freq: Once | INTRAVENOUS | Status: AC
  Administered 2019-05-06: 12.4 mg via INTRAVENOUS
  Filled 2019-05-06: qty 13

## 2019-05-06 MED ORDER — ROCURONIUM BROMIDE 10 MG/ML (PF) SYRINGE: 100.0000 mg | PREFILLED_SYRINGE | Freq: Once | INTRAVENOUS | Status: DC

## 2019-05-06 MED ORDER — PIPERACILLIN-TAZOBACTAM 3.375 G IVPB
3.3750 g | Freq: Three times a day (TID) | INTRAVENOUS | Status: DC
  Administered 2019-05-06 – 2019-05-08 (×6): 3.375 g via INTRAVENOUS
  Filled 2019-05-06 (×7): qty 50

## 2019-05-06 MED ORDER — ARTIFICIAL TEARS OPHTHALMIC OINT
1.0000 "application " | TOPICAL_OINTMENT | Freq: Three times a day (TID) | OPHTHALMIC | Status: DC
  Administered 2019-05-06 – 2019-05-09 (×8): 1 via OPHTHALMIC
  Filled 2019-05-06: qty 3.5

## 2019-05-06 MED ORDER — MIDAZOLAM 50MG/50ML (1MG/ML) PREMIX INFUSION
2.0000 mg/h | INTRAVENOUS | Status: DC
  Administered 2019-05-06: 2 mg/h via INTRAVENOUS
  Administered 2019-05-07: 5 mg/h via INTRAVENOUS
  Administered 2019-05-07: 4 mg/h via INTRAVENOUS
  Administered 2019-05-07 – 2019-05-08 (×2): 5 mg/h via INTRAVENOUS
  Filled 2019-05-06 (×5): qty 50

## 2019-05-06 MED ORDER — MIDAZOLAM HCL 2 MG/2ML IJ SOLN
4.0000 mg | Freq: Once | INTRAMUSCULAR | Status: AC
  Administered 2019-05-06: 4 mg via INTRAVENOUS
  Filled 2019-05-06: qty 4

## 2019-05-06 MED ORDER — PRO-STAT SUGAR FREE PO LIQD
30.0000 mL | Freq: Three times a day (TID) | ORAL | Status: DC
  Administered 2019-05-06 – 2019-05-07 (×3): 30 mL
  Filled 2019-05-06 (×3): qty 30

## 2019-05-06 MED ORDER — MIDAZOLAM HCL 2 MG/2ML IJ SOLN: 2.0000 mg | INTRAMUSCULAR | Status: DC | PRN

## 2019-05-06 NOTE — Consult Note (Signed)
NAME:  [name], MRN:  [id number], DOB:  [birth year], LOS: 3 ADMISSION DATE:  May 13, 2019, CONSULTATION DATE:  05/05/2019 REFERRING MD:  Dr. Bonner Puna, [facility], CHIEF COMPLAINT:  Short of breath   Brief History   44 yo female found to be positive for COVID 19 on 04/26/19 developed progressive dyspnea associated with lethargy.  In ER found to have hypoxia.  Admitted to [facility] on 05-13-19.  Started on decadron and remdesivir.  Treated with tocilizumab and convalescent plasma.  Developed worsening hypoxia and mental status, and required intubation 05/05/19.  Past Medical History  Pre-DM, HTN, Asthma, Obesity  Significant Hospital Events   1/19 Admit 1/20 Transfer to ICU 1/21 VDRF, ARDS protocol-->emergently intubated. Had 6 minute cardiac arrest w/ prolonged time w/ pulse ox in 30s. Given IV TPA w/ concern for PE 1/22 fully awake. Initially asking nursing staff about stopping. After discussing plan of care pt agreeable to aggressive care as long as no evidence of organ failure and on-going evidence of improvement. Starting heavy sedation goal and prone protocol. Checking echo and LE dopplers. Changing propofol to versed and fent at TG increased.   Consults:   PCCM Procedures:  ETT 1/21 >>   Significant Diagnostic Tests:  ECHO 1/22 LE dopplers 1/22  Micro Data:  SARS CoV 2 NAA 1/12 >> detected RVP 1/19 >> negative Sputum 1/21 >>  Blood 1/21>>>  COVID 19 Therapy:  Remdesivir 1/19 >> Decadron 1/19 >> Tocilizumab 1/19 Convalescent plasma 1/20    Antimicrobial Therapy:   Zosyn 1/22>>>  Interim history/subjective:  Awake. Feels like she is getting enough air on support   Objective   Blood pressure 116/75, pulse 75, temperature 99.3 F (37.4 C), temperature source Oral, resp. rate (Abnormal) 29, height 5\' 2"  (1.575 m), weight 123.8 kg, last menstrual period 04/10/2019, SpO2 (Abnormal) 83 %.    Vent Mode: PRVC FiO2 (%):  [100 %] 100 % Set Rate:  [22 bmp-35 bmp] 30 bmp Vt Set:  [400  mL] 400 mL PEEP:  [16 cmH20] 16 cmH20 Plateau Pressure:  [34 cmH20] 34 cmH20   Intake/Output Summary (Last 24 hours) at 05/06/2019 0835 Last data filed at 05/06/2019 0800 Gross per 24 hour  Intake 1525.9 ml  Output 150 ml  Net 1375.9 ml   Filed Weights   13-May-2019 2031 05/04/19 0538  Weight: 127 kg 123.8 kg    Examination: General this is a pleasant cooperative 44 year old wf currently on high peep/fio2 support but tolerating well and not in acute distress HENT NCAT no JVD MMM orally intubated. Sclera not icteric  Pulm equal chest rise. Faint basilar rales. No wheezing Vent settings pplat not able to calculate w/ current mechanics but PIP < 20 fio2 100 Peep 16 Card RRR  abd soft not tender + bowel sounds  Ext brisk CR warm pulses are strong    Resolved Hospital Problem list   Cardiac arrest Cardiogenic shock Brief Af  Assessment & Plan:  Acute hypoxic respiratory failure from COVID 19 pneumonia with ARDS. PCXR w/ diffuse bilateral airspace disease Plan Cont low tidal vol vent 6-8 ml/kg pbw Targets: pao2 55-65. pplat <30, driving pressure < 15 Prone protocol while P/F ratio is < 150 Change to RASS goal of -4 to -5 in effort to limit risk of VALI Switching to versed and fent d/t increased Tryglycerides) Am cxr Day 4/10 decadron  Day 4/5 Remdesivir Daily assessment for diuretics Trend inflammatory markers  S/p Cardiac arrest. Etiology felt Possible Acute PE vs simply refractory hypoxia  but got IV TPA and clinically responded. Had 6 minute time to ROSC but awake and with no neurological deficits.  Plan ECHO today LE dopplers Cont systemic heparin  She is DNR should she have another cardiac arrest.   Hyperglycemia Plan ssi   Mild leukocytosis  Plan Trend cbc & fever curve F/u culture data Will add empiric aspiration coverage day 1 zosyn   Best practice:  Sedation protocol (RASS goal -4 to -5 as of 1/22) VAP bundle 1/21  Glycemic control  ->ordered Diet->start tube feeds 1/22 Activity->bed rest; prone protocol as of 1/22 SUP: H2B DVT: on therapeutic IV heparin s/p TPA on 1/21 Code status: DNR   Critical care time: 45 minutes

## 2019-05-06 NOTE — Procedures (Signed)
Central Venous Catheter Insertion Procedure Note [name] [id number] February 02, 1976  Procedure: Insertion of Central Venous Catheter Indications: Drug and/or fluid administration  Procedure Details Consent: Risks of procedure as well as the alternatives and risks of each were explained to the (patient/caregiver).  Consent for procedure obtained. Time Out: Verified patient identification, verified procedure, site/side was marked, verified correct patient position, special equipment/implants available, medications/allergies/relevent history reviewed, required imaging and test results available.  Performed  Maximum sterile technique was used including antiseptics, cap, gloves, gown, hand hygiene, mask and sheet. Skin prep: Chlorhexidine; local anesthetic administered A antimicrobial bonded/coated triple lumen catheter was placed in the left internal jugular vein using the Seldinger technique.  Ultrasound was used to verify the patency of the vein and for real time needle guidance.  Evaluation Blood flow good Complications: No apparent complications Patient did tolerate procedure well. Chest X-ray ordered to verify placement.  CXR: pending.  [name] 05/06/2019, 5:26 PM  '

## 2019-05-06 NOTE — Progress Notes (Signed)
Decreased FiO2 to 70%, spo2 dropped to 56%. Pt increased back to 100%

## 2019-05-06 NOTE — Progress Notes (Signed)
1645 Requested by RT to place PICC due to patient's declining O2 sats. B/p stable at 123/84. While prepping thr patient the sats dropped into the 40s and 50s  PICC placement aborted at this time and a CVC was placed emergently by the MD. Will cancel PICC placement for now.

## 2019-05-06 NOTE — Progress Notes (Addendum)
Husband updated.   We specifically spoke about [name]'s wishes in regards to life support, and goals of care.  He is in agreement with the plan as She and I outlined earlier today.  Plan Cont aggressive care including: vent support, abx, sedation, prone position, NMB as indicated.   She is DNR should she suffer cardiac arrest Does not want dialysis or CRRT and would want to transition to comfort if develops multiple organ failure.   They are in agreement however to continue aggressive care as long as there is evidence that she is making gradual improvement.    Simonne Martinet ACNP-BC [facility] Pulmonary/Critical Care Pager # [phone] OR # [phone] if no answer

## 2019-05-06 NOTE — Progress Notes (Addendum)
ANTICOAGULATION CONSULT NOTE   Pharmacy Consult for heparin Indication: Suspected PE  Allergies  Allergen Reactions  . Levalbuterol Shortness Of Breath  . Buspar [Buspirone Hcl]      Fainting feelings   . Sulfamethoxazole     REACTION: unspecified  . Sulfonamide Derivatives     REACTION: rash    Patient Measurements: Height: 5\' 2"  (157.5 cm) Weight: 273 lb (123.8 kg) IBW/kg (Calculated) : 50.1 Heparin Dosing Weight: 81kg  Vital Signs: Temp: 99.6 F (37.6 C) (01/22 1400) Temp Source: Axillary (01/22 1400) BP: 131/77 (01/22 1527) Pulse Rate: 93 (01/22 1527)  Labs: Recent Labs    05/04/19 0900 05/04/19 0900 05/05/19 0526 05/05/19 0526 05/05/19 1649 05/05/19 1649 05/05/19 1732 05/05/19 2052 05/06/19 0640  HGB 12.9   < > 13.0   < > 13.3   < > 11.6*  --  11.8*  HCT 40.0   < > 40.5   < > 39.0  --  34.0*  --  37.3  PLT 222  --  303  --   --   --   --   --  179  APTT  --   --   --   --   --   --   --  46*  --   HEPARINUNFRC  --   --   --   --   --   --   --   --  0.14*  CREATININE 0.58   < > 0.69  --   --   --  0.50  --  0.67   < > = values in this interval not displayed.    Estimated Creatinine Clearance: 113.9 mL/min (by C-G formula based on SCr of 0.67 mg/dL).   Assessment: Pt was transferred to the ICU 1/21 after decompensation. She is morbidly obese. She coded tonight and PE was suspected. tPA 50mg  was pushed 1/21 1750. She got tPA 50mg  pushed. Plan to start IV heparin when PTT < 80 sec.  Pt was doing well this AM but O2 sats dropped again tonight.  ECHO>>RV strain likely from clotting again. We are going to give full dose tPA. We will hold heparin until PTT<80  Goal of Therapy:  Heparin level 0.3-0.5 units/ml x24 hr then 0.3-0.7 units/ml Monitor platelets by anticoagulation protocol: Yes   Plan:  Dc heparin tPA 100mg  IV over 2 hrs Resume heparin when PTT<80 with modified goal for 24 hrs  , PharmD, BCIDP, AAHIVP, CPP Infectious Disease  Pharmacist 05/06/2019 6:25 PM  Addendum  PTT came back tonight post tPA at 28. We will resume heparin at 1350 units/hr and check at 6 hr heparin level with a goal listed above.  , PharmD, BCIDP, AAHIVP, CPP Infectious Disease Pharmacist 05/06/2019 9:00 PM

## 2019-05-06 NOTE — Progress Notes (Signed)
[facility] PCCM  Oxygenation improved with TPA and neuromuscular blockade  If oxygenation worsening again tonight would check CT angiogram and if positive for persist PE would send to [facility] for catheter directed thrombolysis, discussed with IR.  Heber [place], MD [place] PCCM Pager: [phone] Cell: [phone] If no response, call [phone]

## 2019-05-06 NOTE — Progress Notes (Signed)
[facility] PCCM  Called emergently to bedside for evaluation of dropping O2 saturation despite ventilator synchrony.  Bedside ultrasound of chest showed sliding lung, CXR showed no new acute process.  Bedside stat echo images sent by text to heart failure team who confirms RV overload (septal bowing, increased RV diameter).  This morning's echo did not show severe RV overload.  Given circumstances it seems this is most likely pulmonary embolism again, will give another dose of TPA.  Will also paralyze her with nimbex and will plan prone positioning later today.  May need to consider EKOS or less likely ECMO.  CVL placed emergently prior to TPA administration.    Additional CC time 45 minutes  Heber [place], MD [place] PCCM Pager: [phone] Cell: [phone] If no response, call [phone]

## 2019-05-06 NOTE — Plan of Care (Signed)
  Problem: Education: Goal: Knowledge of risk factors and measures for prevention of condition will improve Outcome: Progressing   Problem: Coping: Goal: Psychosocial and spiritual needs will be supported Outcome: Progressing   Problem: Respiratory: Goal: Will maintain a patent airway Outcome: Progressing Goal: Complications related to the disease process, condition or treatment will be avoided or minimized Outcome: Progressing   

## 2019-05-06 NOTE — Progress Notes (Addendum)
Initial Nutrition Assessment RD working remotely.  DOCUMENTATION CODES:   Morbid obesity  INTERVENTION:    Vital AF 1.2 at 40 ml/h (960 ml per day)   Pro-stat 30 ml TID   Provides 1452 kcal (1843 kcal total with propofol), 117 gm protein, 779 ml free water daily   When IVF discontinued, recommend free water flushes 200 ml every 4 hours.  NUTRITION DIAGNOSIS:   Increased nutrient needs related to catabolic illness(COVID) as evidenced by estimated needs.  GOAL:   Patient will meet greater than or equal to 90% of their needs  MONITOR:   Vent status, TF tolerance, Labs, I & O's  REASON FOR ASSESSMENT:   Ventilator, Consult Enteral/tube feeding initiation and management  ASSESSMENT:   44 yo female admitted to [facility] 1/19 with hypoxia. COVID positive on 1/12. Required intubation on 1/21. PMH includes asthma, HLD, chronic bronchitis, GERD, dysrhythmia, pre-diabetes.   Received MD Consult for TF initiation and management. Proning protocol initiated today.   Patient is currently intubated on ventilator support, requiring one pressor MV: 18.6 L/min Temp (24hrs), Avg:99 F (37.2 C), Min:98.5 F (36.9 C), Max:99.8 F (37.7 C)  Propofol: 14.8 ml/hr providing 391 kcal from lipid.  Labs reviewed.  CBG's: [phone]  Medications reviewed and include decadron, novolog SSI every 4 hours + 2 units every 4 hours, levemir, levophed, propofol, remdesivir.  IVF: NS at 75 ml/h   Usual weights reviewed. No significant weight changes noted over the past 2 years.  NUTRITION - FOCUSED PHYSICAL EXAM:  unable to complete due to COVID restrictions  Diet Order:   Diet Order            Diet NPO time specified  Diet effective now              EDUCATION NEEDS:   Not appropriate for education at this time  Skin:  Skin Assessment: Reviewed RN Assessment  Last BM:  1/21  Height:   Ht Readings from Last 1 Encounters:  05/04/19 5\' 2"  (1.575 m)    Weight:   Wt  Readings from Last 1 Encounters:  05/04/19 123.8 kg    Ideal Body Weight:  50 kg  BMI:  Body mass index is 49.93 kg/m.  Estimated Nutritional Needs:   Kcal:  05/06/19  Protein:  115-125 gm  Fluid:  >/= 1.6 L    [id number], RD, LDN, CNSC Pager [phone] After Hours Pager [phone]

## 2019-05-06 NOTE — Progress Notes (Signed)
  Echocardiogram 2D Echocardiogram has been performed.  Pieter Partridge 05/06/2019, 1:47 PM

## 2019-05-06 NOTE — Progress Notes (Signed)
ANTICOAGULATION & ANTIBIOTIC CONSULT NOTE   Pharmacy Consult for heparin; Zosyn Indication: Suspected PE; R/o aspiration pna   Allergies  Allergen Reactions  . Levalbuterol Shortness Of Breath  . Buspar [Buspirone Hcl]      Fainting feelings   . Sulfamethoxazole     REACTION: unspecified  . Sulfonamide Derivatives     REACTION: rash    Patient Measurements: Height: 5\' 2"  (157.5 cm) Weight: 273 lb (123.8 kg) IBW/kg (Calculated) : 50.1 Heparin Dosing Weight: 81kg  Vital Signs: Temp: 99.3 F (37.4 C) (01/22 0800) Temp Source: Oral (01/22 0800) BP: 116/75 (01/22 0800) Pulse Rate: 75 (01/22 0800)  Labs: Recent Labs    05/04/19 0900 05/04/19 0900 May 30, 2019 0526 05/30/2019 0526 05/30/19 1649 2019-05-30 1649 May 30, 2019 1732 2019-05-30 2052 05/06/19 0640  HGB 12.9   < > 13.0   < > 13.3   < > 11.6*  --  11.8*  HCT 40.0   < > 40.5   < > 39.0  --  34.0*  --  37.3  PLT 222  --  303  --   --   --   --   --  179  APTT  --   --   --   --   --   --   --  46*  --   HEPARINUNFRC  --   --   --   --   --   --   --   --  0.14*  CREATININE 0.58   < > 0.69  --   --   --  0.50  --  0.67   < > = values in this interval not displayed.    Estimated Creatinine Clearance: 113.9 mL/min (by C-G formula based on SCr of 0.67 mg/dL).   Assessment: Pt was transferred to the ICU 05/29/22 after decompensation. She is morbidly obese. She coded on 05/29/22 and PE was suspected. tPA 50mg  was pushed 05/29/22 1750.   AC: Currently on IV heparin at 1150 units/hr. Initial HL is subtherapeutic at 0.14. H/H low stable, Plt wnl. No s/s of bleeding noted   ID: Pharmacy consulted to start Zosyn for possible aspiration pneumonia. WBC is trending up. SCr wnl   Goal of Therapy:  Heparin level 0.3-0.5 units/ml x24 hr then 0.3-0.7 units/ml Monitor platelets by anticoagulation protocol: Yes   Plan:  Increase heparin to 1350 units/hr. No bolus. Will f/u 6 hr heparin level  Start Zosyn 3.375 gm IV Q 8 hours MOnitor CBC, renal  fx, and cultures   , PharmD., BCPS Clinical Pharmacist Clinical phone for 05/06/19 until 5pm: [phone]

## 2019-05-07 ENCOUNTER — Inpatient Hospital Stay (HOSPITAL_COMMUNITY): Payer: Managed Care, Other (non HMO)

## 2019-05-07 DIAGNOSIS — I2699 Other pulmonary embolism without acute cor pulmonale: Secondary | ICD-10-CM

## 2019-05-07 LAB — COMPREHENSIVE METABOLIC PANEL
ALT: 82 U/L — ABNORMAL HIGH (ref 0–44)
AST: 109 U/L — ABNORMAL HIGH (ref 15–41)
Albumin: 2.6 g/dL — ABNORMAL LOW (ref 3.5–5.0)
Alkaline Phosphatase: 141 U/L — ABNORMAL HIGH (ref 38–126)
Anion gap: 9 (ref 5–15)
BUN: 22 mg/dL — ABNORMAL HIGH (ref 6–20)
CO2: 30 mmol/L (ref 22–32)
Calcium: 7.5 mg/dL — ABNORMAL LOW (ref 8.9–10.3)
Chloride: 101 mmol/L (ref 98–111)
Creatinine, Ser: 0.66 mg/dL (ref 0.44–1.00)
GFR calc Af Amer: >60 mL/min (ref >60)
GFR calc non Af Amer: >60 mL/min (ref >60)
Glucose, Bld: 200 mg/dL — ABNORMAL HIGH (ref 70–99)
Potassium: 4.2 mmol/L (ref 3.5–5.1)
Sodium: 140 mmol/L (ref 135–145)
Total Bilirubin: 0.6 mg/dL (ref 0.3–1.2)
Total Protein: 5.7 g/dL — ABNORMAL LOW (ref 6.5–8.1)

## 2019-05-07 LAB — TRIGLYCERIDES: Triglycerides: 1593 mg/dL — ABNORMAL HIGH (ref <150)

## 2019-05-07 LAB — GLUCOSE, CAPILLARY
Glucose-Capillary: 182 mg/dL — ABNORMAL HIGH (ref 70–99)
Glucose-Capillary: 134 mg/dL — ABNORMAL HIGH (ref 70–99)
Glucose-Capillary: 163 mg/dL — ABNORMAL HIGH (ref 70–99)
Glucose-Capillary: 159 mg/dL — ABNORMAL HIGH (ref 70–99)
Glucose-Capillary: 157 mg/dL — ABNORMAL HIGH (ref 70–99)

## 2019-05-07 LAB — CBC
HCT: 36 % (ref 36.0–46.0)
Hemoglobin: 11.2 g/dL — ABNORMAL LOW (ref 12.0–15.0)
MCH: 27.7 pg (ref 26.0–34.0)
MCHC: 31.1 g/dL (ref 30.0–36.0)
MCV: 89.1 fL (ref 80.0–100.0)
Platelets: 127 10*3/uL — ABNORMAL LOW (ref 150–400)
RBC: 4.04 MIL/uL (ref 3.87–5.11)
RDW: 13.6 % (ref 11.5–15.5)
WBC: 12 10*3/uL — ABNORMAL HIGH (ref 4.0–10.5)
nRBC: 0.3 % — ABNORMAL HIGH (ref 0.0–0.2)

## 2019-05-07 LAB — D-DIMER, QUANTITATIVE: D-Dimer, Quant: >20 ug/mL-FEU — ABNORMAL HIGH (ref 0.00–0.50)

## 2019-05-07 LAB — PHOSPHORUS: Phosphorus: 3.6 mg/dL (ref 2.5–4.6)

## 2019-05-07 LAB — MAGNESIUM: Magnesium: 2.6 mg/dL — ABNORMAL HIGH (ref 1.7–2.4)

## 2019-05-07 LAB — HEPARIN LEVEL (UNFRACTIONATED)
Heparin Unfractionated: 0.3 IU/mL (ref 0.30–0.70)
Heparin Unfractionated: 0.22 IU/mL — ABNORMAL LOW (ref 0.30–0.70)

## 2019-05-07 LAB — C-REACTIVE PROTEIN: CRP: 4.9 mg/dL — ABNORMAL HIGH (ref <1.0)

## 2019-05-07 MED ORDER — VITAL 1.5 CAL PO LIQD
1000.0000 mL | ORAL | Status: DC
  Administered 2019-05-07 – 2019-05-16 (×5): 1000 mL

## 2019-05-07 MED ORDER — PRO-STAT SUGAR FREE PO LIQD
60.0000 mL | Freq: Two times a day (BID) | ORAL | Status: DC
  Administered 2019-05-07 – 2019-05-11 (×8): 60 mL
  Filled 2019-05-07 (×8): qty 60

## 2019-05-07 MED ORDER — FUROSEMIDE 10 MG/ML IJ SOLN
40.0000 mg | Freq: Two times a day (BID) | INTRAMUSCULAR | Status: AC
  Administered 2019-05-07 (×2): 40 mg via INTRAVENOUS
  Filled 2019-05-07 (×2): qty 4

## 2019-05-07 MED ORDER — CHLORHEXIDINE GLUCONATE 0.12 % MT SOLN
15.0000 mL | Freq: Two times a day (BID) | OROMUCOSAL | Status: DC
  Administered 2019-05-07 – 2019-05-17 (×20): 15 mL via OROMUCOSAL
  Filled 2019-05-07 (×10): qty 15

## 2019-05-07 NOTE — Progress Notes (Signed)
Patient's head repositioned and arms rotated.  Patient tolerated well.  No skin breakdown noted at this time.    

## 2019-05-07 NOTE — Progress Notes (Signed)
ANTICOAGULATION CONSULT NOTE   Pharmacy Consult for heparin Indication: Suspected PE  Allergies  Allergen Reactions  . Levalbuterol Shortness Of Breath  . Buspar [Buspirone Hcl]      Fainting feelings   . Sulfamethoxazole     REACTION: unspecified  . Sulfonamide Derivatives     REACTION: rash    Patient Measurements: Height: 5\' 2"  (157.5 cm) Weight: 278 lb 7.1 oz (126.3 kg) IBW/kg (Calculated) : 50.1 Heparin Dosing Weight: 81kg  Vital Signs: Temp: 98.7 F (37.1 C) (01/23 0400) Temp Source: Oral (01/23 0400) BP: 112/68 (01/23 0600) Pulse Rate: 70 (01/23 0600)  Labs: Recent Labs    2019/05/11 0526 May 11, 2019 1649 2019/05/11 1732 05-11-19 1732 May 11, 2019 2052 05/06/19 0640 05/06/19 1837 05/07/19 0455  HGB 13.0   < > 11.6*   < >  --  11.8*  --  11.2*  HCT 40.5   < > 34.0*  --   --  37.3  --  36.0  PLT 303  --   --   --   --  179  --  127*  APTT  --   --   --   --  46*  --  28  --   LABPROT  --   --   --   --   --   --  20.0*  --   INR  --   --   --   --   --   --  1.7*  --   HEPARINUNFRC  --   --   --   --   --  0.14*  --  0.30  CREATININE 0.69  --  0.50  --   --  0.67  --  0.66   < > = values in this interval not displayed.    Estimated Creatinine Clearance: 115.4 mL/min (by C-G formula based on SCr of 0.66 mg/dL).   Assessment: Pt was transferred to the ICU May 10, 2022 after decompensation. She is morbidly obese. She coded on May 10, 2022 requiring 50 mg IV tPA for suspected PE and was started on IV heparin. She had a repeat code blue event on 1/22 requiring 100 mg IV tPA.   She is currently back on IV heparin at 1350 units/hr. HL this AM is low therapeutic at 0.3. H/H low stable, Plt down to 127k. SCr wnl  Goal of Therapy:  Heparin level 0.3-0.5 units/ml x24 hr then 0.3-0.7 units/ml Monitor platelets by anticoagulation protocol: Yes   Plan:  -Increase IV heparin slightly to 1400 units/hr.  -F/u 6 hr HL -Monitor daily HL, CBC and s/s of bleeding.   2/22,  PharmD., BCPS Clinical Pharmacist Clinical phone for 05/07/19 until 5pm: [phone]

## 2019-05-07 NOTE — Progress Notes (Signed)
Patient placed in prone position.  ETT secured with cloth tape at 23 cm at the lip.  Patient tolerated well with no complications.  

## 2019-05-07 NOTE — Progress Notes (Addendum)
ANTICOAGULATION CONSULT NOTE   Pharmacy Consult for heparin Indication: Suspected PE  Allergies  Allergen Reactions  . Levalbuterol Shortness Of Breath  . Buspar [Buspirone Hcl]      Fainting feelings   . Sulfamethoxazole     REACTION: unspecified  . Sulfonamide Derivatives     REACTION: rash    Patient Measurements: Height: 5\' 2"  (157.5 cm) Weight: 278 lb 7.1 oz (126.3 kg) IBW/kg (Calculated) : 50.1 Heparin Dosing Weight: 81kg  Vital Signs: Temp: (P) 98.8 F (37.1 C) (01/23 1530) Temp Source: Oral (01/23 1530) BP: 100/52 (01/23 1530) Pulse Rate: 59 (01/23 1530)  Labs: Recent Labs    05/07/19 0526 05-07-2019 1649 2019-05-07 1732 May 07, 2019 1732 05/07/2019 2052 05/06/19 0640 05/06/19 1837 05/07/19 0455 05/07/19 1537  HGB 13.0   < > 11.6*   < >  --  11.8*  --  11.2*  --   HCT 40.5   < > 34.0*  --   --  37.3  --  36.0  --   PLT 303  --   --   --   --  179  --  127*  --   APTT  --   --   --   --  46*  --  28  --   --   LABPROT  --   --   --   --   --   --  20.0*  --   --   INR  --   --   --   --   --   --  1.7*  --   --   HEPARINUNFRC  --   --   --   --   --  0.14*  --  0.30 0.22*  CREATININE 0.69  --  0.50  --   --  0.67  --  0.66  --    < > = values in this interval not displayed.    Estimated Creatinine Clearance: 115.4 mL/min (by C-G formula based on SCr of 0.66 mg/dL).   Assessment: Pt was transferred to the ICU May 06, 2022 after decompensation. She is morbidly obese. She coded on 05/06/22 requiring 50 mg IV tPA for suspected PE and was started on IV heparin. She had a repeat code blue event on 1/22 requiring 100 mg IV tPA.   She is currently back on IV heparin at 1350 units/hr. HL this AM is low therapeutic at 0.3. H/H low stable, Plt down to 127k. SCr wnl  Heparin level dropped to 0.22 after the slight rate increase. We will bump up again with the lowered goal for now. Can go back to normal goal with the next level.   Goal of Therapy:  Heparin level 0.3-0.5 units/ml x24  hr then 0.3-0.7 units/ml Monitor platelets by anticoagulation protocol: Yes   Plan:  -Increase IV heparin slightly to 1500 units/hr.  -F/u 6 hr HL -Monitor daily HL, CBC and s/s of bleeding.   2/22, PharmD, BCIDP, AAHIVP, CPP Infectious Disease Pharmacist 05/07/2019 4:46 PM

## 2019-05-07 NOTE — Progress Notes (Addendum)
Patient had no neuro changes overnight s/p tPA. Heparin drip restarted at 1350u/hr.  Patient remains on 100% FiO2, 16 PEEP saturating 88-90% spO2. BP/HR stable, GCS 3, RASS -5. Patient paralyzed on nimbex @3 , on versed and fentanyl gtt. Adequate urine output. Husband called this morning and updated.

## 2019-05-07 NOTE — Progress Notes (Signed)
NAME:  [name], MRN:  [id number], DOB:  Mar 10, 1976, LOS: 4 ADMISSION DATE:  2019/05/20, CONSULTATION DATE:  1/21 REFERRING MD:  Jarvis Newcomer, CHIEF COMPLAINT:  dyspnea   Brief History   44 yo female found to be positive for COVID 19 on 04/26/19 developed progressive dyspnea associated with lethargy.  In ER found to have hypoxia.  Admitted to [facility] on 05/20/2019.  Started on decadron and remdesivir.  Treated with tocilizumab and convalescent plasma.  Developed worsening hypoxia and mental status, and required intubation 05/05/19.  Past Medical History  Pre-DM, HTN, Asthma, Obesity  Significant Hospital Events   1/19 Admit 1/20 Transfer to ICU 1/21 VDRF, ARDS protocol-->emergently intubated. Had 6 minute cardiac arrest w/ prolonged time w/ pulse ox in 30s. Given IV TPA w/ concern for PE 1/22 fully awake. Initially asking nursing staff about stopping. After discussing plan of care pt agreeable to aggressive care as long as no evidence of organ failure and on-going evidence of improvement. Starting heavy sedation goal and prone protocol. Checking echo and LE dopplers. Changing propofol to versed and fent at TG increased.  Later in the day developed acute hypoxemia again, repeat bedside echo showed worsening RV dysfunction, given TPA again.  1/23 worsening oxygenation  Consults:  PCCM  Procedures:  ETT 1/21 >>  L IJ CVL 1/22 >>  Significant Diagnostic Tests:  ECHO 1/22 LE dopplers 1/23> positive small left leg DVT  Micro Data:  SARS CoV 2 NAA 1/12 >> detected RVP 1/19 >> negative Resp 1/21 >> GPC Blood 1/21>>>  Antimicrobials/COVID Rx:  Remdesivir 1/19 >> Decadron 1/19 >> Tocilizumab 1/19 Convalescent plasma 1/20   Zosyn 1/22 >   Interim history/subjective:  worsenign hypoxemia tpa again yesterday   Objective   Blood pressure 112/68, pulse 70, temperature 98.7 F (37.1 C), temperature source Oral, resp. rate (!) 30, height 5\' 2"  (1.575 m), weight 126.3 kg, last menstrual period  04/10/2019, SpO2 90 %.    Vent Mode: PRVC FiO2 (%):  [90 %-100 %] 100 % Set Rate:  [30 bmp] 30 bmp Vt Set:  [400 mL] 400 mL PEEP:  [16 cmH20] 16 cmH20 Plateau Pressure:  [34 cmH20] 34 cmH20   Intake/Output Summary (Last 24 hours) at 05/07/2019 0757 Last data filed at 05/07/2019 0600 Gross per 24 hour  Intake 3275.09 ml  Output 1225 ml  Net 2050.09 ml   Filed Weights   05/20/19 2031 05/04/19 0538 05/07/19 0500  Weight: 127 kg 123.8 kg 126.3 kg    Examination: General:  In bed on vent HENT: NCAT ETT in place PULM: Crackles bases B, vent supported breathing CV: RRR, no mgr GI: BS+, soft, nontender MSK: normal bulk and tone Neuro: sedated on vent    Resolved Hospital Problem list     Assessment & Plan:  ARDS due to COVID 19 pneumonia: worsening CXR and oxygenation Continue mechanical ventilation per ARDS protocol Target TVol 6-8cc/kgIBW Target Plateau Pressure < 30cm H20 Target driving pressure less than 15 cm of water Target PaO2 55-65: titrate PEEP/FiO2 per protocol As long as PaO2 to FiO2 ratio is less than 1:150 position in prone position for 16 hours a day Check CVP daily if CVL in place Target CVP less than 4, diurese as necessary Ventilator associated pneumonia prevention protocol 1/23 prone today, diurese, lasix x2, foley, continue nimbex  Need for sedation for mechanical vent Severe ventilator dyssynchrony Continue neuromuscular blockade protocol Continue fentanyl/versed, RASS target -5 Continue oral sedatives  S/P Cardiac arrest on 1/21, likely acute PE  Bedside echo on 1/22 with severe hypoxemia showed WORSENING RV failure compared to study earlier in day, likely second PE TPA x2 Heparin infusion tele  Hyperglycemia SSI   Best practice:  Diet: tube feeding Pain/Anxiety/Delirium protocol (if indicated): RASS target -4 to -5 VAP protocol (if indicated): yes DVT prophylaxis: on heparin GI prophylaxis: famotidine Glucose control: SSI Mobility:  bed rest Code Status: DNR if arrests Family Communication: husband updated 1/23 Disposition: remain in ICU  Labs   CBC: Recent Labs  Lab 05/05/2019 1842 05-05-2019 1842 05/04/19 0900 05/04/19 0900 05/05/19 0526 05/05/19 1649 05/05/19 1732 05/06/19 0640 05/07/19 0455  WBC 6.8  --  5.5  --  7.2  --   --  11.8* 12.0*  NEUTROABS 5.2  --  4.2  --  5.8  --   --  9.1*  --   HGB 12.6   < > 12.9   < > 13.0 13.3 11.6* 11.8* 11.2*  HCT 40.2   < > 40.0   < > 40.5 39.0 34.0* 37.3 36.0  MCV 86.1  --  83.3  --  84.9  --   --  85.4 89.1  PLT 214  --  222  --  303  --   --  179 127*   < > = values in this interval not displayed.    Basic Metabolic Panel: Recent Labs  Lab 2019/05/05 1842 05-May-2019 1842 05/04/19 0900 05/04/19 0900 05/05/19 0526 05/05/19 1649 05/05/19 1732 05/05/19 2052 05/06/19 0640 05/06/19 1837 05/07/19 0455  NA 136   < > 141   < > 139 140 142  --  141  --  140  K 3.5   < > 4.0   < > 4.0 4.0 4.6  --  3.7  --  4.2  CL 100   < > 106  --  101  --  110  --  102  --  101  CO2 24  --  23  --  24  --   --   --  26  --  30  GLUCOSE 164*   < > 143*  --  175*  --  177*  --  160*  --  200*  BUN 8   < > 8  --  14  --  22*  --  18  --  22*  CREATININE 0.69   < > 0.58  --  0.69  --  0.50  --  0.67  --  0.66  CALCIUM 8.6*  --  8.8*  --  8.8*  --   --   --  7.9*  --  7.5*  MG  --   --   --   --   --   --   --  2.3 2.1 2.6* 2.6*  PHOS  --   --   --   --   --   --   --  5.3* 3.2 4.9* 3.6   < > = values in this interval not displayed.   GFR: Estimated Creatinine Clearance: 115.4 mL/min (by C-G formula based on SCr of 0.66 mg/dL). Recent Labs  Lab 05-05-19 1842 05/05/2019 1842 May 05, 2019 2110 05/04/19 0900 05/05/19 0526 05/06/19 0640 05/07/19 0455  PROCALCITON <0.10  --   --   --   --   --   --   WBC 6.8   < >  --  5.5 7.2 11.8* 12.0*  LATICACIDVEN 2.1*  --  1.7  --   --   --   --    < > =  values in this interval not displayed.    Liver Function Tests: Recent Labs  Lab  05/01/2019 1842 05/04/19 0900 05/05/19 0526 05/06/19 0640 05/07/19 0455  AST 36 32 37 66* 109*  ALT 28 27 27  58* 82*  ALKPHOS 103 106 111 120 141*  BILITOT 0.4 0.4 0.5 0.6 0.6  PROT 7.5 7.6 8.0 6.5 5.7*  ALBUMIN 3.0* 3.0* 3.2* 2.8* 2.6*   No results for input(s): LIPASE, AMYLASE in the last 168 hours. No results for input(s): AMMONIA in the last 168 hours.  ABG    Component Value Date/Time   PHART 7.303 (L) 05/05/2019 1649   PCO2ART 60.3 (H) 05/05/2019 1649   PO2ART 52.0 (L) 05/05/2019 1649   HCO3 29.8 (H) 05/05/2019 1649   TCO2 22 05/05/2019 1732   O2SAT 82.0 05/05/2019 1649     Coagulation Profile: Recent Labs  Lab 05/06/19 1837  INR 1.7*    Cardiac Enzymes: No results for input(s): CKTOTAL, CKMB, CKMBINDEX, TROPONINI in the last 168 hours.  HbA1C: Hgb A1c MFr Bld  Date/Time Value Ref Range Status  04/23/2019 09:10 PM 6.8 (H) 4.8 - 5.6 % Final    Comment:    (NOTE) Pre diabetes:          5.7%-6.4% Diabetes:              >6.4% Glycemic control for   <7.0% adults with diabetes   01/28/2019 08:32 AM 6.9 (H) 4.6 - 6.5 % Final    Comment:    Glycemic Control Guidelines for People with Diabetes:Non Diabetic:  <6%Goal of Therapy: <7%Additional Action Suggested:  >8%     CBG: Recent Labs  Lab 05/06/19 1143 05/06/19 1527 05/06/19 1957 05/06/19 2319 05/07/19 0430  GLUCAP 125* 203* 146* 191* 182*       Critical care time: 31 minutes     Roselie Awkward, MD [place] PCCM Pager: [phone] Cell: [phone] If no response, call [phone]

## 2019-05-07 NOTE — Progress Notes (Signed)
Nutrition Follow-up  DOCUMENTATION CODES:   Morbid obesity  INTERVENTION:   Vital AF 1.5 at 20 ml, advance 10 ml every 6 hrs to goal 40 ml/h (960 ml per day)   Pro-stat 60 ml BID   Provides 1840 kcal, 125 gm protein, 730 ml free water daily   When IVF discontinued, recommend free water flushes 200 ml every 4 hours.  NUTRITION DIAGNOSIS:   Increased nutrient needs related to catabolic illness(COVID) as evidenced by estimated needs.  Addressing via TF  GOAL:   Patient will meet greater than or equal to 90% of their needs  Progressing  MONITOR:   Vent status, TF tolerance, Labs, I & O's  REASON FOR ASSESSMENT:   Ventilator, Consult Enteral/tube feeding initiation and management  ASSESSMENT:  RD working remotely.  44 yo female admitted to [facility] 1/19 with hypoxia. COVID positive on 1/12. Required intubation on 1/21. PMH includes asthma, HLD, chronic bronchitis, GERD, dysrhythmia, pre-diabetes.  1/22 - CVC placed emergently 1/22 - Proning protocol initiated  Per notes pt had no neuro changes overnight s/p tPA. Pt paralyzed on nimbex, on versed and fentanyl.   Patient is currently intubated on ventilator support MV: 12.2 L/min Temp (24hrs), Avg:99 F (37.2 C), Min:98.6 F (37 C), Max:99.6 F (37.6 C)  Not on Propofol  Medications reviewed and include: Decadron, Pepcid, Lasix, SS novolog, Levemir, Oxycodone  IVF: NaCl @ 50 ml/hr  Nimbex 3 mcg Fentanyl 200 mcg Versed 4 mg Levophed 2 mcg Zosyn Heparin D10 @ 40   Labs: CBGs 163,134,182,191 x 24 hrs, Mg 2.6 (H), TGs 1593 (H)   Diet Order:   Diet Order            Diet NPO time specified  Diet effective now              EDUCATION NEEDS:   Not appropriate for education at this time  Skin:  Skin Assessment: Reviewed RN Assessment  Last BM:  1/21  Height:   Ht Readings from Last 1 Encounters:  05/04/19 5\' 2"  (1.575 m)    Weight:   Wt Readings from Last 1 Encounters:  05/07/19 126.3  kg    Ideal Body Weight:  50 kg  BMI:  Body mass index is 50.93 kg/m.  Estimated Nutritional Needs:   Kcal:  05/09/19  Protein:  115-125 gm  Fluid:  >/= 1.6 L   [id number], RD, LDN Clinical Nutrition [place] Telephone [phone] After Hours/Weekend Pager: [phone]

## 2019-05-07 NOTE — Plan of Care (Signed)
  Problem: Education: Goal: Knowledge of risk factors and measures for prevention of condition will improve Outcome: Not Progressing   Problem: Coping: Goal: Psychosocial and spiritual needs will be supported Outcome: Progressing   Problem: Respiratory: Goal: Will maintain a patent airway Outcome: Progressing Goal: Complications related to the disease process, condition or treatment will be avoided or minimized Outcome: Progressing   

## 2019-05-07 NOTE — Progress Notes (Signed)
Pt's spouse called for update.  Nurse updated him on pt' status and answered any questions he had re: pt.  Nurse playing some gospel music in room for pt which he felt would be well received by pt.

## 2019-05-07 NOTE — Progress Notes (Signed)
Bilateral lower extremity venous duplex has been completed. Preliminary results can be found in CV Proc through chart review.  Results were given to the patient's nurse, Tasha.  05/07/19 11:40 AM Olen Cordial RVT

## 2019-05-07 NOTE — Progress Notes (Signed)
Raliegh Scarlet, RN wasted 63mL of Fentanyl and 19mL of versed with Chrystie Nose, RN

## 2019-05-08 DIAGNOSIS — I10 Essential (primary) hypertension: Secondary | ICD-10-CM

## 2019-05-08 LAB — CBC
HCT: 35.7 % — ABNORMAL LOW (ref 36.0–46.0)
Hemoglobin: 11.2 g/dL — ABNORMAL LOW (ref 12.0–15.0)
MCH: 27.7 pg (ref 26.0–34.0)
MCHC: 31.4 g/dL (ref 30.0–36.0)
MCV: 88.4 fL (ref 80.0–100.0)
Platelets: 131 10*3/uL — ABNORMAL LOW (ref 150–400)
RBC: 4.04 MIL/uL (ref 3.87–5.11)
RDW: 13.6 % (ref 11.5–15.5)
WBC: 10.6 10*3/uL — ABNORMAL HIGH (ref 4.0–10.5)
nRBC: 0.3 % — ABNORMAL HIGH (ref 0.0–0.2)

## 2019-05-08 LAB — GLUCOSE, CAPILLARY
Glucose-Capillary: 167 mg/dL — ABNORMAL HIGH (ref 70–99)
Glucose-Capillary: 197 mg/dL — ABNORMAL HIGH (ref 70–99)
Glucose-Capillary: 205 mg/dL — ABNORMAL HIGH (ref 70–99)
Glucose-Capillary: 219 mg/dL — ABNORMAL HIGH (ref 70–99)
Glucose-Capillary: 223 mg/dL — ABNORMAL HIGH (ref 70–99)
Glucose-Capillary: 240 mg/dL — ABNORMAL HIGH (ref 70–99)
Glucose-Capillary: 246 mg/dL — ABNORMAL HIGH (ref 70–99)

## 2019-05-08 LAB — POCT I-STAT 7, (LYTES, BLD GAS, ICA,H+H)
Acid-Base Excess: 10 mmol/L — ABNORMAL HIGH (ref 0.0–2.0)
Acid-Base Excess: 12 mmol/L — ABNORMAL HIGH (ref 0.0–2.0)
Bicarbonate: 37 mmol/L — ABNORMAL HIGH (ref 20.0–28.0)
Bicarbonate: 38.2 mmol/L — ABNORMAL HIGH (ref 20.0–28.0)
Calcium, Ion: 1.08 mmol/L — ABNORMAL LOW (ref 1.15–1.40)
Calcium, Ion: 1.09 mmol/L — ABNORMAL LOW (ref 1.15–1.40)
HCT: 32 % — ABNORMAL LOW (ref 36.0–46.0)
HCT: 33 % — ABNORMAL LOW (ref 36.0–46.0)
Hemoglobin: 10.9 g/dL — ABNORMAL LOW (ref 12.0–15.0)
Hemoglobin: 11.2 g/dL — ABNORMAL LOW (ref 12.0–15.0)
O2 Saturation: 100 %
O2 Saturation: 96 %
Patient temperature: 99
Patient temperature: 100.9
Potassium: 4.1 mmol/L (ref 3.5–5.1)
Potassium: 3.7 mmol/L (ref 3.5–5.1)
Sodium: 141 mmol/L (ref 135–145)
Sodium: 143 mmol/L (ref 135–145)
TCO2: 39 mmol/L — ABNORMAL HIGH (ref 22–32)
TCO2: 40 mmol/L — ABNORMAL HIGH (ref 22–32)
pCO2 arterial: 61.2 mmHg — ABNORMAL HIGH (ref 32.0–48.0)
pCO2 arterial: 58.2 mmHg — ABNORMAL HIGH (ref 32.0–48.0)
pH, Arterial: 7.39 (ref 7.350–7.450)
pH, Arterial: 7.43 (ref 7.350–7.450)
pO2, Arterial: 371 mmHg — ABNORMAL HIGH (ref 83.0–108.0)
pO2, Arterial: 85 mmHg (ref 83.0–108.0)

## 2019-05-08 LAB — COMPREHENSIVE METABOLIC PANEL
ALT: 112 U/L — ABNORMAL HIGH (ref 0–44)
AST: 107 U/L — ABNORMAL HIGH (ref 15–41)
Albumin: 2.7 g/dL — ABNORMAL LOW (ref 3.5–5.0)
Alkaline Phosphatase: 119 U/L (ref 38–126)
Anion gap: 10 (ref 5–15)
BUN: 22 mg/dL — ABNORMAL HIGH (ref 6–20)
CO2: 33 mmol/L — ABNORMAL HIGH (ref 22–32)
Calcium: 7.6 mg/dL — ABNORMAL LOW (ref 8.9–10.3)
Chloride: 100 mmol/L (ref 98–111)
Creatinine, Ser: 0.76 mg/dL (ref 0.44–1.00)
GFR calc Af Amer: >60 mL/min (ref >60)
GFR calc non Af Amer: >60 mL/min (ref >60)
Glucose, Bld: 237 mg/dL — ABNORMAL HIGH (ref 70–99)
Potassium: 4.1 mmol/L (ref 3.5–5.1)
Sodium: 143 mmol/L (ref 135–145)
Total Bilirubin: 0.7 mg/dL (ref 0.3–1.2)
Total Protein: 5.8 g/dL — ABNORMAL LOW (ref 6.5–8.1)

## 2019-05-08 LAB — CULTURE, RESPIRATORY W GRAM STAIN: Culture: NORMAL

## 2019-05-08 LAB — CULTURE, BLOOD (ROUTINE X 2)
Culture: NO GROWTH
Culture: NO GROWTH
Special Requests: ADEQUATE
Special Requests: ADEQUATE

## 2019-05-08 LAB — HEPARIN LEVEL (UNFRACTIONATED)
Heparin Unfractionated: 0.43 IU/mL (ref 0.30–0.70)
Heparin Unfractionated: 0.37 IU/mL (ref 0.30–0.70)

## 2019-05-08 LAB — TRIGLYCERIDES: Triglycerides: 983 mg/dL — ABNORMAL HIGH (ref <150)

## 2019-05-08 LAB — D-DIMER, QUANTITATIVE: D-Dimer, Quant: 7.2 ug/mL-FEU — ABNORMAL HIGH (ref 0.00–0.50)

## 2019-05-08 MED ORDER — FENTANYL BOLUS VIA INFUSION
50.0000 ug | INTRAVENOUS | Status: DC | PRN
  Administered 2019-05-08 – 2019-05-11 (×4): 50 ug via INTRAVENOUS
  Filled 2019-05-08: qty 50

## 2019-05-08 MED ORDER — FENTANYL 2500MCG IN NS 250ML (10MCG/ML) PREMIX INFUSION
50.0000 ug/h | INTRAVENOUS | Status: DC
  Administered 2019-05-08 – 2019-05-11 (×6): 200 ug/h via INTRAVENOUS
  Filled 2019-05-08 (×7): qty 250

## 2019-05-08 MED ORDER — FUROSEMIDE 10 MG/ML IJ SOLN
40.0000 mg | Freq: Four times a day (QID) | INTRAMUSCULAR | Status: AC
  Administered 2019-05-08 (×2): 40 mg via INTRAVENOUS
  Filled 2019-05-08 (×2): qty 4

## 2019-05-08 MED ORDER — MIDAZOLAM BOLUS VIA INFUSION
1.0000 mg | INTRAVENOUS | Status: DC | PRN
  Administered 2019-05-08: 1 mg via INTRAVENOUS
  Filled 2019-05-08: qty 2

## 2019-05-08 MED ORDER — MIDAZOLAM 50MG/50ML (1MG/ML) PREMIX INFUSION
0.0000 mg/h | INTRAVENOUS | Status: DC
  Administered 2019-05-08 – 2019-05-10 (×6): 5 mg/h via INTRAVENOUS
  Administered 2019-05-11: 10 mg/h via INTRAVENOUS
  Administered 2019-05-11: 5 mg/h via INTRAVENOUS
  Filled 2019-05-08 (×7): qty 50

## 2019-05-08 NOTE — Progress Notes (Signed)
NAME:  [name], MRN:  [id number], DOB:  [birth year], LOS: 5 ADMISSION DATE:  2019-05-30, CONSULTATION DATE:  1/21 REFERRING MD:  Bonner Puna, CHIEF COMPLAINT:  dyspnea   Brief History   44 yo female found to be positive for COVID 19 on 04/26/19 developed progressive dyspnea associated with lethargy.  In ER found to have hypoxia.  Admitted to [facility] on 05/30/2019.  Started on decadron and remdesivir.  Treated with tocilizumab and convalescent plasma.  Developed worsening hypoxia and mental status, and required intubation 05/05/19.  Past Medical History  Pre-DM, HTN, Asthma, Obesity  Significant Hospital Events   1/19 Admit 1/20 Transfer to ICU 1/21 VDRF, ARDS protocol-->emergently intubated. Had 6 minute cardiac arrest w/ prolonged time w/ pulse ox in 30s. Given IV TPA w/ concern for PE 1/22 fully awake. Initially asking nursing staff about stopping. After discussing plan of care pt agreeable to aggressive care as long as no evidence of organ failure and on-going evidence of improvement. Starting heavy sedation goal and prone protocol. Checking echo and LE dopplers. Changing propofol to versed and fent at TG increased.  Later in the day developed acute hypoxemia again, repeat bedside echo showed worsening RV dysfunction, given TPA again.  1/23 worsening oxygenation  Consults:  PCCM  Procedures:  ETT 1/21 >>  L IJ CVL 1/22 >>  Significant Diagnostic Tests:  ECHO 1/22 LE dopplers 1/23> positive small left leg DVT popliteal  Micro Data:  SARS CoV 2 NAA 1/12 >> detected RVP 1/19 >> negative Resp 1/21 >> GPC> OPF Blood 1/21>>>  Antimicrobials/COVID Rx:  Remdesivir 1/19 >> Decadron 1/19 >> Tocilizumab 1/19 Convalescent plasma 1/20   Zosyn 1/22 >   Interim history/subjective:   oxygenation better, low grade fever  Objective   Blood pressure (!) 107/55, pulse (!) 59, temperature 99.9 F (37.7 C), resp. rate (!) 30, height 5\' 2"  (1.575 m), weight 126.3 kg, last menstrual period  04/10/2019, SpO2 95 %.    Vent Mode: PRVC FiO2 (%):  [40 %-100 %] 40 % Set Rate:  [30 bmp] 30 bmp Vt Set:  [400 mL] 400 mL PEEP:  [16 cmH20-20 cmH20] 16 cmH20 Plateau Pressure:  [28 cmH20-39 cmH20] 28 cmH20   Intake/Output Summary (Last 24 hours) at 05/08/2019 0724 Last data filed at 05/08/2019 0700 Gross per 24 hour  Intake 4103.33 ml  Output 5340 ml  Net -1236.67 ml   Filed Weights   05/30/19 2031 05/04/19 0538 05/07/19 0500  Weight: 127 kg 123.8 kg 126.3 kg    Examination:  General:  In bed on vent HENT: NCAT ETT in place PULM: CTA B, vent supported breathing CV: RRR, no mgr GI: BS+, soft, nontender MSK: normal bulk and tone Neuro: sedated on vent   Resolved Hospital Problem list     Assessment & Plan:  ARDS due to COVID 19 pneumonia: significantly improved oxygenation Bacterial pneumonia? Unclear if she actually has this  Fever due to COVID Continue mechanical ventilation per ARDS protocol Target TVol 6-8cc/kgIBW Target Plateau Pressure < 30cm H20 Target driving pressure less than 15 cm of water Target PaO2 55-65: titrate PEEP/FiO2 per protocol As long as PaO2 to FiO2 ratio is less than 1:150 position in prone position for 16 hours a day Check CVP daily if CVL in place Target CVP less than 4, diurese as necessary Ventilator associated pneumonia prevention protocol 1/24 no need to prone, continue diuresis, stop neuromuscular blockade, stop zosyn, monitor fever  Need for sedation for mechanical vent Severe ventilator dyssynchrony PAD protocol  RASS target -2 Continue oral sedatives today Continue fentanyl/versed  S/P Cardiac arrest on 1/21, likely acute PE, received TPA Bedside echo on 1/22 with severe hypoxemia showed WORSENING RV failure compared to study earlier in day, likely second PE, received TPA again LE doppler positive for DVT on 1/23 Continue heparin infusion  Hyperglycemia SSI  Best practice:  Diet: tube feeding Pain/Anxiety/Delirium  protocol (if indicated): RASS target -4 to -5 VAP protocol (if indicated): yes DVT prophylaxis: on heparin GI prophylaxis: famotidine Glucose control: SSI Mobility: bed rest Code Status: DNR if arrests Family Communication: I called Glenn for an update on 1/24 Disposition: remain in ICU  Labs   CBC: Recent Labs  Lab 05/10/2019 1842 04/20/2019 1842 05/04/19 0900 05/04/19 0900 05/05/19 0526 05/05/19 1649 05/05/19 1732 05/06/19 0640 05/07/19 0455 05/08/19 0505 05/08/19 0608  WBC 6.8   < > 5.5  --  7.2  --   --  11.8* 12.0* 10.6*  --   NEUTROABS 5.2  --  4.2  --  5.8  --   --  9.1*  --   --   --   HGB 12.6   < > 12.9   < > 13.0   < > 11.6* 11.8* 11.2* 11.2* 10.9*  HCT 40.2   < > 40.0   < > 40.5   < > 34.0* 37.3 36.0 35.7* 32.0*  MCV 86.1   < > 83.3  --  84.9  --   --  85.4 89.1 88.4  --   PLT 214   < > 222  --  303  --   --  179 127* 131*  --    < > = values in this interval not displayed.    Basic Metabolic Panel: Recent Labs  Lab 05/04/19 0900 05/04/19 0900 05/05/19 0526 05/05/19 1649 05/05/19 1732 05/05/19 2052 05/06/19 0640 05/06/19 1837 05/07/19 0455 05/08/19 0505 05/08/19 0608  NA 141   < > 139   < > 142  --  141  --  140 143 141  K 4.0   < > 4.0   < > 4.6  --  3.7  --  4.2 4.1 4.1  CL 106   < > 101  --  110  --  102  --  101 100  --   CO2 23  --  24  --   --   --  26  --  30 33*  --   GLUCOSE 143*   < > 175*  --  177*  --  160*  --  200* 237*  --   BUN 8   < > 14  --  22*  --  18  --  22* 22*  --   CREATININE 0.58   < > 0.69  --  0.50  --  0.67  --  0.66 0.76  --   CALCIUM 8.8*  --  8.8*  --   --   --  7.9*  --  7.5* 7.6*  --   MG  --   --   --   --   --  2.3 2.1 2.6* 2.6*  --   --   PHOS  --   --   --   --   --  5.3* 3.2 4.9* 3.6  --   --    < > = values in this interval not displayed.   GFR: Estimated Creatinine Clearance: 115.4 mL/min (by C-G formula based on SCr of 0.76  mg/dL). Recent Labs  Lab 05/20/19 1842 05/20/2019 2110 05/04/19 0900  05/05/19 0526 05/06/19 0640 05/07/19 0455 05/08/19 0505  PROCALCITON <0.10  --   --   --   --   --   --   WBC 6.8  --    < > 7.2 11.8* 12.0* 10.6*  LATICACIDVEN 2.1* 1.7  --   --   --   --   --    < > = values in this interval not displayed.    Liver Function Tests: Recent Labs  Lab 05/04/19 0900 05/05/19 0526 05/06/19 0640 05/07/19 0455 05/08/19 0505  AST 32 37 66* 109* 107*  ALT 27 27 58* 82* 112*  ALKPHOS 106 111 120 141* 119  BILITOT 0.4 0.5 0.6 0.6 0.7  PROT 7.6 8.0 6.5 5.7* 5.8*  ALBUMIN 3.0* 3.2* 2.8* 2.6* 2.7*   No results for input(s): LIPASE, AMYLASE in the last 168 hours. No results for input(s): AMMONIA in the last 168 hours.  ABG    Component Value Date/Time   PHART 7.390 05/08/2019 0608   PCO2ART 61.2 (H) 05/08/2019 0608   PO2ART 371.0 (H) 05/08/2019 0608   HCO3 37.0 (H) 05/08/2019 0608   TCO2 39 (H) 05/08/2019 0608   O2SAT 100.0 05/08/2019 0608     Coagulation Profile: Recent Labs  Lab 05/06/19 1837  INR 1.7*    Cardiac Enzymes: No results for input(s): CKTOTAL, CKMB, CKMBINDEX, TROPONINI in the last 168 hours.  HbA1C: Hgb A1c MFr Bld  Date/Time Value Ref Range Status  05/20/2019 09:10 PM 6.8 (H) 4.8 - 5.6 % Final    Comment:    (NOTE) Pre diabetes:          5.7%-6.4% Diabetes:              >6.4% Glycemic control for   <7.0% adults with diabetes   01/28/2019 08:32 AM 6.9 (H) 4.6 - 6.5 % Final    Comment:    Glycemic Control Guidelines for People with Diabetes:Non Diabetic:  <6%Goal of Therapy: <7%Additional Action Suggested:  >8%     CBG: Recent Labs  Lab 05/07/19 1155 05/07/19 1559 05/07/19 2039 05/08/19 0013 05/08/19 0503  GLUCAP 163* 159* 157* 205* 219*       Critical care time: 35 minutes     Heber [place], MD [place] [facility] Pager: [phone] Cell: [phone] If no response, call [phone]

## 2019-05-08 NOTE — Progress Notes (Signed)
Pt's spouse, Sherrine Maples, called for update this evening.  Questions answered by nurse to best of her ability.  At this time, pt is not proned and maintaining sats at 95% on FiO2 of 80%.

## 2019-05-08 NOTE — Progress Notes (Signed)
RT NOTE:  Pt turned to supine position at this time. ETT secured throughout. No breakdown noted.

## 2019-05-08 NOTE — Progress Notes (Signed)
ANTICOAGULATION CONSULT NOTE   Pharmacy Consult for heparin Indication: Suspected PE  Allergies  Allergen Reactions  . Levalbuterol Shortness Of Breath  . Buspar [Buspirone Hcl]      Fainting feelings   . Sulfamethoxazole     REACTION: unspecified  . Sulfonamide Derivatives     REACTION: rash    Patient Measurements: Height: 5\' 2"  (157.5 cm) Weight: 278 lb 7.1 oz (126.3 kg) IBW/kg (Calculated) : 50.1 Heparin Dosing Weight: 81kg  Vital Signs: Temp: 98 F (36.7 C) (01/24 0000) Temp Source: Axillary (01/24 0000) BP: 124/69 (01/24 0000) Pulse Rate: 65 (01/24 0000)  Labs: Recent Labs    May 27, 2019 0526 05-27-2019 1649 2019/05/27 1732 2019-05-27 1732 May 27, 2019 2052 05/06/19 0640 05/06/19 0640 05/06/19 1837 05/07/19 0455 05/07/19 1537 05/08/19 0010  HGB 13.0   < > 11.6*   < >  --  11.8*  --   --  11.2*  --   --   HCT 40.5   < > 34.0*  --   --  37.3  --   --  36.0  --   --   PLT 303  --   --   --   --  179  --   --  127*  --   --   APTT  --   --   --   --  46*  --   --  28  --   --   --   LABPROT  --   --   --   --   --   --   --  20.0*  --   --   --   INR  --   --   --   --   --   --   --  1.7*  --   --   --   HEPARINUNFRC  --   --   --   --   --  0.14*   < >  --  0.30 0.22* 0.43  CREATININE 0.69  --  0.50  --   --  0.67  --   --  0.66  --   --    < > = values in this interval not displayed.    Estimated Creatinine Clearance: 115.4 mL/min (by C-G formula based on SCr of 0.66 mg/dL).   Assessment: Pt was transferred to the ICU 26-May-2022 after decompensation. She is morbidly obese. She coded on 2022/05/26 requiring 50 mg IV tPA for suspected PE and was started on IV heparin. She had a repeat code blue event on 1/23 requiring 100 mg IV tPA.   Heparin level therapeutic (0.43) on gtt at 1500 units/hr. No bleeding noted.   Goal of Therapy:  Heparin level 0.3-0.7 units/ml Monitor platelets by anticoagulation protocol: Yes   Plan:  Continue IV heparin 1500 units/hr.  Will f/u daily  heparin level and CBC  2/23, PharmD, BCPS Please see amion for complete clinical pharmacist phone list 05/08/2019 2:49 AM

## 2019-05-09 ENCOUNTER — Inpatient Hospital Stay (HOSPITAL_COMMUNITY): Payer: Managed Care, Other (non HMO)

## 2019-05-09 DIAGNOSIS — J9601 Acute respiratory failure with hypoxia: Secondary | ICD-10-CM

## 2019-05-09 LAB — COMPREHENSIVE METABOLIC PANEL
ALT: 94 U/L — ABNORMAL HIGH (ref 0–44)
AST: 68 U/L — ABNORMAL HIGH (ref 15–41)
Albumin: 2.9 g/dL — ABNORMAL LOW (ref 3.5–5.0)
Alkaline Phosphatase: 103 U/L (ref 38–126)
Anion gap: 12 (ref 5–15)
BUN: 27 mg/dL — ABNORMAL HIGH (ref 6–20)
CO2: 33 mmol/L — ABNORMAL HIGH (ref 22–32)
Calcium: 7.8 mg/dL — ABNORMAL LOW (ref 8.9–10.3)
Chloride: 99 mmol/L (ref 98–111)
Creatinine, Ser: 0.75 mg/dL (ref 0.44–1.00)
GFR calc Af Amer: >60 mL/min (ref >60)
GFR calc non Af Amer: >60 mL/min (ref >60)
Glucose, Bld: 239 mg/dL — ABNORMAL HIGH (ref 70–99)
Potassium: 3.8 mmol/L (ref 3.5–5.1)
Sodium: 144 mmol/L (ref 135–145)
Total Bilirubin: 0.7 mg/dL (ref 0.3–1.2)
Total Protein: 6 g/dL — ABNORMAL LOW (ref 6.5–8.1)

## 2019-05-09 LAB — POCT I-STAT 7, (LYTES, BLD GAS, ICA,H+H)
Acid-Base Excess: 10 mmol/L — ABNORMAL HIGH (ref 0.0–2.0)
Acid-Base Excess: 4 mmol/L — ABNORMAL HIGH (ref 0.0–2.0)
Bicarbonate: 36 mmol/L — ABNORMAL HIGH (ref 20.0–28.0)
Bicarbonate: 31 mmol/L — ABNORMAL HIGH (ref 20.0–28.0)
Calcium, Ion: 1.1 mmol/L — ABNORMAL LOW (ref 1.15–1.40)
Calcium, Ion: 1.12 mmol/L — ABNORMAL LOW (ref 1.15–1.40)
HCT: 32 % — ABNORMAL LOW (ref 36.0–46.0)
HCT: 32 % — ABNORMAL LOW (ref 36.0–46.0)
Hemoglobin: 10.9 g/dL — ABNORMAL LOW (ref 12.0–15.0)
Hemoglobin: 10.9 g/dL — ABNORMAL LOW (ref 12.0–15.0)
O2 Saturation: 93 %
O2 Saturation: 88 %
Patient temperature: 101.9
Patient temperature: 101.9
Potassium: 4.2 mmol/L (ref 3.5–5.1)
Potassium: 4 mmol/L (ref 3.5–5.1)
Sodium: 143 mmol/L (ref 135–145)
Sodium: 143 mmol/L (ref 135–145)
TCO2: 38 mmol/L — ABNORMAL HIGH (ref 22–32)
TCO2: 33 mmol/L — ABNORMAL HIGH (ref 22–32)
pCO2 arterial: 57.8 mmHg — ABNORMAL HIGH (ref 32.0–48.0)
pCO2 arterial: 60 mmHg — ABNORMAL HIGH (ref 32.0–48.0)
pH, Arterial: 7.409 (ref 7.350–7.450)
pH, Arterial: 7.33 — ABNORMAL LOW (ref 7.350–7.450)
pO2, Arterial: 74 mmHg — ABNORMAL LOW (ref 83.0–108.0)
pO2, Arterial: 66 mmHg — ABNORMAL LOW (ref 83.0–108.0)

## 2019-05-09 LAB — CBC WITH DIFFERENTIAL/PLATELET
Abs Immature Granulocytes: 0.47 10*3/uL — ABNORMAL HIGH (ref 0.00–0.07)
Basophils Absolute: 0 10*3/uL (ref 0.0–0.1)
Basophils Relative: 0 %
Eosinophils Absolute: 0 10*3/uL (ref 0.0–0.5)
Eosinophils Relative: 0 %
HCT: 36 % (ref 36.0–46.0)
Hemoglobin: 10.9 g/dL — ABNORMAL LOW (ref 12.0–15.0)
Immature Granulocytes: 4 %
Lymphocytes Relative: 18 %
Lymphs Abs: 1.9 10*3/uL (ref 0.7–4.0)
MCH: 27.3 pg (ref 26.0–34.0)
MCHC: 30.3 g/dL (ref 30.0–36.0)
MCV: 90.2 fL (ref 80.0–100.0)
Monocytes Absolute: 0.3 10*3/uL (ref 0.1–1.0)
Monocytes Relative: 3 %
Neutro Abs: 8.1 10*3/uL — ABNORMAL HIGH (ref 1.7–7.7)
Neutrophils Relative %: 75 %
Platelets: 137 10*3/uL — ABNORMAL LOW (ref 150–400)
RBC: 3.99 MIL/uL (ref 3.87–5.11)
RDW: 13.4 % (ref 11.5–15.5)
WBC: 10.7 10*3/uL — ABNORMAL HIGH (ref 4.0–10.5)
nRBC: 0.4 % — ABNORMAL HIGH (ref 0.0–0.2)

## 2019-05-09 LAB — GLUCOSE, CAPILLARY
Glucose-Capillary: 163 mg/dL — ABNORMAL HIGH (ref 70–99)
Glucose-Capillary: 168 mg/dL — ABNORMAL HIGH (ref 70–99)
Glucose-Capillary: 220 mg/dL — ABNORMAL HIGH (ref 70–99)
Glucose-Capillary: 191 mg/dL — ABNORMAL HIGH (ref 70–99)
Glucose-Capillary: 196 mg/dL — ABNORMAL HIGH (ref 70–99)
Glucose-Capillary: 207 mg/dL — ABNORMAL HIGH (ref 70–99)
Glucose-Capillary: 153 mg/dL — ABNORMAL HIGH (ref 70–99)

## 2019-05-09 LAB — TRIGLYCERIDES: Triglycerides: 819 mg/dL — ABNORMAL HIGH (ref <150)

## 2019-05-09 LAB — HEPARIN LEVEL (UNFRACTIONATED): Heparin Unfractionated: 0.61 IU/mL (ref 0.30–0.70)

## 2019-05-09 LAB — PROCALCITONIN: Procalcitonin: <0.1 ng/mL

## 2019-05-09 LAB — D-DIMER, QUANTITATIVE: D-Dimer, Quant: 3.36 ug/mL-FEU — ABNORMAL HIGH (ref 0.00–0.50)

## 2019-05-09 MED ORDER — PIPERACILLIN-TAZOBACTAM 3.375 G IVPB
3.3750 g | Freq: Three times a day (TID) | INTRAVENOUS | Status: DC
  Administered 2019-05-09 – 2019-05-11 (×6): 3.375 g via INTRAVENOUS
  Filled 2019-05-09 (×9): qty 50

## 2019-05-09 NOTE — Progress Notes (Signed)
Inpatient Diabetes Program Recommendations  AACE/ADA: New Consensus Statement on Inpatient Glycemic Control   Target Ranges:  Prepandial:   less than 140 mg/dL      Peak postprandial:   less than 180 mg/dL (1-2 hours)      Critically ill patients:  140 - 180 mg/dL   Results for [name], [name] (MRN [id number]) as of 05/09/2019 13:31  Ref. Range 05/08/2019 08:11 05/08/2019 11:27 05/08/2019 15:59 05/08/2019 19:54 05/08/2019 23:48 05/09/2019 04:19 05/09/2019 07:51 05/09/2019 12:02  Glucose-Capillary Latest Ref Range: 70 - 99 mg/dL 474 (H) 259 (H) 563 (H) 197 (H) 223 (H) 220 (H) 191 (H) 196 (H)   Review of Glycemic Control   Current orders for Inpatient glycemic control: Levemir 19 units BID, Novolog 0-20 units Q4H, Novolog 2 units Q4H; Decadron 6 mg Q12H; Tube Feeding @ 40 ml/hr  Inpatient Diabetes Program Recommendations:    Insulin-Tube Feeding: Please consider increasing tube feeding coverage to Novolog 5 units Q4H.  Thanks. Orlando Penner, RN, MSN, CDE Diabetes Coordinator Inpatient Diabetes Program [phone] (Team Pager from 8am to 5pm)

## 2019-05-09 NOTE — Progress Notes (Signed)
NAME:  [name], MRN:  [id number], DOB:  [birth year], LOS: 6 ADMISSION DATE:  06-01-19, CONSULTATION DATE:  1/21 REFERRING MD:  Jarvis Newcomer, CHIEF COMPLAINT:  dyspnea   Brief History   44 yo female found to be positive for COVID 19 on 04/26/19 developed progressive dyspnea associated with lethargy.  In ER found to have hypoxia.  Admitted to [facility] on 2019-06-01.  Started on decadron and remdesivir.  Treated with tocilizumab and convalescent plasma.  Developed worsening hypoxia and mental status, and required intubation 05/05/19.  Past Medical History  Pre-DM, HTN, Asthma, Obesity  Significant Hospital Events   1/19 Admit 1/20 Transfer to ICU 1/21 VDRF, ARDS protocol-->emergently intubated. Had 6 minute cardiac arrest w/ prolonged time w/ pulse ox in 30s. Given IV TPA w/ concern for PE 1/22 fully awake. Initially asking nursing staff about stopping. After discussing plan of care pt agreeable to aggressive care as long as no evidence of organ failure and on-going evidence of improvement. Starting heavy sedation goal and prone protocol. Checking echo and LE dopplers. Changing propofol to versed and fent at TG increased.  Later in the day developed acute hypoxemia again, repeat bedside echo showed worsening RV dysfunction, given TPA again.  1/23 worsening oxygenation  Consults:  PCCM  Procedures:  ETT 1/21 >>  L IJ CVL 1/22 >>  Significant Diagnostic Tests:  ECHO 1/22: LVEF 60-65%, otherwise normal.  LE dopplers 1/23> positive small left leg DVT popliteal  Micro Data:  SARS CoV 2 NAA 1/12 >> detected RVP 1/19 >> negative Resp 1/22: GPC Blood 1/19: negative  Antimicrobials/COVID Rx:  Remdesivir 1/19 >>1/24 Decadron 1/19 >> Tocilizumab 1/19 Convalescent plasma 1/20   Zosyn 1/22 > 1/24  Interim history/subjective:  1/25: tmax 102.9 overnight. New pneumomediastinum noted on cxr this am.  1/24: oxygenation better, low grade fever  Objective   Blood pressure (!) 126/59, pulse 65,  temperature (!) 101.4 F (38.6 C), temperature source Oral, resp. rate 20, height 5\' 2"  (1.575 m), weight 126.3 kg, last menstrual period 04/10/2019, SpO2 98 %.    Vent Mode: PCV FiO2 (%):  [50 %-80 %] 80 % Set Rate:  [30 bmp] 30 bmp Vt Set:  [400 mL] 400 mL PEEP:  [16 cmH20] 16 cmH20 Plateau Pressure:  [21 cmH20-31 cmH20] 21 cmH20   Intake/Output Summary (Last 24 hours) at 05/09/2019 05/11/2019 Last data filed at 05/09/2019 0700 Gross per 24 hour  Intake 4131.83 ml  Output 4080 ml  Net 51.83 ml   Filed Weights   01-Jun-2019 2031 05/04/19 0538 05/07/19 0500  Weight: 127 kg 123.8 kg 126.3 kg    Examination:  General:  In bed on vent HENT: NCAT ETT in place PULM: CTA B, vent supported breathing CV: RRR, no mgr GI: BS+, soft, nontender MSK: normal bulk and tone Neuro: sedated on vent   Resolved Hospital Problem list     Assessment & Plan:  ARDS due to COVID 19 pneumonia: significantly improved oxygenation Bacterial pneumonia? Unclear if she actually has this  Fever due to COVID Pneumomediastinum Continue mechanical ventilation per ARDS protocol Target TVol 6-8cc/kgIBW Target Plateau Pressure < 30cm H20 Target driving pressure less than 15 cm of water Target PaO2 55-65: titrate PEEP/FiO2 per protocol As long as PaO2 to FiO2 ratio is less than 1:150 position in prone position for 16 hours a day Check CVP daily if CVL in place Target CVP less than 4, diurese as necessary Ventilator associated pneumonia prevention protocol  1/25 pneumomediastinum noted on imaging, personally reviewed  by me. Recheck this afternoon.   Need for sedation for mechanical vent Severe ventilator dyssynchrony PAD protocol RASS target -2 Continue oral sedatives today Continue fentanyl/versed  S/P Cardiac arrest on 1/21, likely acute PE, received TPA Bedside echo on 1/22 with severe hypoxemia showed WORSENING RV failure compared to study earlier in day, likely second PE, received TPA again LE doppler  positive for DVT on 1/23 Continue heparin infusion -ddimer is down today.   dm2 with Hyperglycemia SSI -a1c 6.8  Transaminitis:  -improving trend  Thrombocytopenia:  -noted and stable around 130's  Best practice:  Diet: tube feeding Pain/Anxiety/Delirium protocol (if indicated): RASS target -4 to -5 VAP protocol (if indicated): yes DVT prophylaxis: on heparin GI prophylaxis: famotidine Glucose control: SSI Mobility: bed rest Code Status: DNR if arrests Family Communication: spoke with husband via phone on 1/25 Disposition: remain in ICU  Labs   CBC: Recent Labs  Lab 05/06/2019 1842 04/21/2019 1842 05/04/19 0900 05/04/19 0900 05/05/19 0526 05/05/19 1649 05/06/19 0640 05/06/19 0640 05/07/19 0455 05/08/19 0505 05/08/19 0608 05/08/19 2100 05/09/19 0436  WBC 6.8   < > 5.5   < > 7.2  --  11.8*  --  12.0* 10.6*  --   --  10.7*  NEUTROABS 5.2  --  4.2  --  5.8  --  9.1*  --   --   --   --   --  8.1*  HGB 12.6   < > 12.9   < > 13.0   < > 11.8*   < > 11.2* 11.2* 10.9* 11.2* 10.9*  HCT 40.2   < > 40.0   < > 40.5   < > 37.3   < > 36.0 35.7* 32.0* 33.0* 36.0  MCV 86.1   < > 83.3   < > 84.9  --  85.4  --  89.1 88.4  --   --  90.2  PLT 214   < > 222   < > 303  --  179  --  127* 131*  --   --  137*   < > = values in this interval not displayed.    Basic Metabolic Panel: Recent Labs  Lab 05/05/19 0526 05/05/19 1649 05/05/19 1732 05/05/19 1732 05/05/19 2052 05/06/19 0640 05/06/19 0640 05/06/19 1837 05/07/19 0455 05/08/19 0505 05/08/19 0608 05/08/19 2100 05/09/19 0436  NA 139   < > 142   < >  --  141   < >  --  140 143 141 143 144  K 4.0   < > 4.6   < >  --  3.7   < >  --  4.2 4.1 4.1 3.7 3.8  CL 101  --  110  --   --  102  --   --  101 100  --   --  99  CO2 24  --   --   --   --  26  --   --  30 33*  --   --  33*  GLUCOSE 175*  --  177*  --   --  160*  --   --  200* 237*  --   --  239*  BUN 14  --  22*  --   --  18  --   --  22* 22*  --   --  27*  CREATININE 0.69   --  0.50  --   --  0.67  --   --  0.66 0.76  --   --  0.75  CALCIUM 8.8*  --   --   --   --  7.9*  --   --  7.5* 7.6*  --   --  7.8*  MG  --   --   --   --  2.3 2.1  --  2.6* 2.6*  --   --   --   --   PHOS  --   --   --   --  5.3* 3.2  --  4.9* 3.6  --   --   --   --    < > = values in this interval not displayed.   GFR: Estimated Creatinine Clearance: 115.4 mL/min (by C-G formula based on SCr of 0.75 mg/dL). Recent Labs  Lab 05/14/2019 1842 05/15/2019 2110 05/04/19 0900 05/06/19 0640 05/07/19 0455 05/08/19 0505 05/09/19 0436  PROCALCITON <0.10  --   --   --   --   --   --   WBC 6.8  --    < > 11.8* 12.0* 10.6* 10.7*  LATICACIDVEN 2.1* 1.7  --   --   --   --   --    < > = values in this interval not displayed.    Liver Function Tests: Recent Labs  Lab 05/05/19 0526 05/06/19 0640 05/07/19 0455 05/08/19 0505 05/09/19 0436  AST 37 66* 109* 107* 68*  ALT 27 58* 82* 112* 94*  ALKPHOS 111 120 141* 119 103  BILITOT 0.5 0.6 0.6 0.7 0.7  PROT 8.0 6.5 5.7* 5.8* 6.0*  ALBUMIN 3.2* 2.8* 2.6* 2.7* 2.9*   No results for input(s): LIPASE, AMYLASE in the last 168 hours. No results for input(s): AMMONIA in the last 168 hours.  ABG    Component Value Date/Time   PHART 7.430 05/08/2019 2100   PCO2ART 58.2 (H) 05/08/2019 2100   PO2ART 85.0 05/08/2019 2100   HCO3 38.2 (H) 05/08/2019 2100   TCO2 40 (H) 05/08/2019 2100   O2SAT 96.0 05/08/2019 2100     Coagulation Profile: Recent Labs  Lab 05/06/19 1837  INR 1.7*    Cardiac Enzymes: No results for input(s): CKTOTAL, CKMB, CKMBINDEX, TROPONINI in the last 168 hours.  HbA1C: Hgb A1c MFr Bld  Date/Time Value Ref Range Status  05/04/2019 09:10 PM 6.8 (H) 4.8 - 5.6 % Final    Comment:    (NOTE) Pre diabetes:          5.7%-6.4% Diabetes:              >6.4% Glycemic control for   <7.0% adults with diabetes   01/28/2019 08:32 AM 6.9 (H) 4.6 - 6.5 % Final    Comment:    Glycemic Control Guidelines for People with Diabetes:Non  Diabetic:  <6%Goal of Therapy: <7%Additional Action Suggested:  >8%     CBG: Recent Labs  Lab 05/08/19 1559 05/08/19 1954 05/08/19 2348 05/09/19 0419 05/09/19 0751  GLUCAP 246* 197* 223* 220* 191*       Critical care time: 37 minutes    Critical care time: The patient is critically ill with multiple organ systems failure and requires high complexity decision making for assessment and support, frequent evaluation and titration of therapies, application of advanced monitoring technologies and extensive interpretation of multiple databases.  Critical care time 37 mins. This represents my time independent of the NPs time taking care of the pt. This is excluding procedures.    Briant Sites DO [facility] 05/09/2019, 8:42 AM

## 2019-05-09 NOTE — Progress Notes (Signed)
Patient placed in supine position by RT x 2 and RN x 3 without complications.  ETT remains secured at 23 cm with cloth tape.

## 2019-05-09 NOTE — Progress Notes (Signed)
ANTICOAGULATION CONSULT NOTE   Pharmacy Consult for heparin Indication: Suspected PE  Allergies  Allergen Reactions  . Levalbuterol Shortness Of Breath  . Buspar [Buspirone Hcl]      Fainting feelings   . Sulfamethoxazole     REACTION: unspecified  . Sulfonamide Derivatives     REACTION: rash    Patient Measurements: Height: 5\' 2"  (157.5 cm) Weight: 278 lb 7.1 oz (126.3 kg) IBW/kg (Calculated) : 50.1 Heparin Dosing Weight: 81kg  Vital Signs: Temp: 101.4 F (38.6 C) (01/25 0000) Temp Source: Oral (01/25 0000) BP: 125/61 (01/25 0500) Pulse Rate: 62 (01/25 0500)  Labs: Recent Labs    05/06/19 0640 05/06/19 0640 05/06/19 1837 05/07/19 0455 05/07/19 0455 05/07/19 1537 05/08/19 0010 05/08/19 0505 05/08/19 0505 05/08/19 0608 05/08/19 0608 05/08/19 2100 05/09/19 0436  HGB 11.8*   < >  --  11.2*   < >  --   --  11.2*   < > 10.9*   < > 11.2* 10.9*  HCT 37.3   < >  --  36.0   < >  --   --  35.7*   < > 32.0*  --  33.0* 36.0  PLT 179   < >  --  127*  --   --   --  131*  --   --   --   --  137*  APTT  --   --  28  --   --   --   --   --   --   --   --   --   --   LABPROT  --   --  20.0*  --   --   --   --   --   --   --   --   --   --   INR  --   --  1.7*  --   --   --   --   --   --   --   --   --   --   HEPARINUNFRC 0.14*   < >  --  0.30  --    < > 0.43 0.37  --   --   --   --  0.61  CREATININE 0.67  --   --  0.66  --   --   --  0.76  --   --   --   --   --    < > = values in this interval not displayed.    Estimated Creatinine Clearance: 115.4 mL/min (by C-G formula based on SCr of 0.76 mg/dL).   Assessment: Pt was transferred to the ICU May 26, 2022 after decompensation. She is morbidly obese. She coded on 2022/05/26 requiring 50 mg IV tPA for suspected PE and was started on IV heparin. She had a repeat code blue event on 1/23 requiring 100 mg IV tPA.   Heparin level remains therapeutic (0.61) on gtt at 1500 units/hr. No bleeding noted.   Goal of Therapy:  Heparin level  0.3-0.7 units/ml Monitor platelets by anticoagulation protocol: Yes   Plan:  Continue IV heparin 1500 units/hr.  Will f/u daily heparin level and CBC  Thanks for allowing pharmacy to be a part of this patient's care.  2/23, PharmD Clinical Pharmacist  05/09/2019 6:14 AM

## 2019-05-10 ENCOUNTER — Inpatient Hospital Stay (HOSPITAL_COMMUNITY): Payer: Managed Care, Other (non HMO)

## 2019-05-10 LAB — COMPREHENSIVE METABOLIC PANEL
ALT: 77 U/L — ABNORMAL HIGH (ref 0–44)
AST: 57 U/L — ABNORMAL HIGH (ref 15–41)
Albumin: 2.7 g/dL — ABNORMAL LOW (ref 3.5–5.0)
Alkaline Phosphatase: 86 U/L (ref 38–126)
Anion gap: 9 (ref 5–15)
BUN: 24 mg/dL — ABNORMAL HIGH (ref 6–20)
CO2: 31 mmol/L (ref 22–32)
Calcium: 7.6 mg/dL — ABNORMAL LOW (ref 8.9–10.3)
Chloride: 103 mmol/L (ref 98–111)
Creatinine, Ser: 0.7 mg/dL (ref 0.44–1.00)
GFR calc Af Amer: >60 mL/min (ref >60)
GFR calc non Af Amer: >60 mL/min (ref >60)
Glucose, Bld: 208 mg/dL — ABNORMAL HIGH (ref 70–99)
Potassium: 4 mmol/L (ref 3.5–5.1)
Sodium: 143 mmol/L (ref 135–145)
Total Bilirubin: 0.5 mg/dL (ref 0.3–1.2)
Total Protein: 5.5 g/dL — ABNORMAL LOW (ref 6.5–8.1)

## 2019-05-10 LAB — MAGNESIUM: Magnesium: 2.6 mg/dL — ABNORMAL HIGH (ref 1.7–2.4)

## 2019-05-10 LAB — PROCALCITONIN: Procalcitonin: <0.1 ng/mL

## 2019-05-10 LAB — GLUCOSE, CAPILLARY
Glucose-Capillary: 171 mg/dL — ABNORMAL HIGH (ref 70–99)
Glucose-Capillary: 193 mg/dL — ABNORMAL HIGH (ref 70–99)
Glucose-Capillary: 217 mg/dL — ABNORMAL HIGH (ref 70–99)
Glucose-Capillary: 231 mg/dL — ABNORMAL HIGH (ref 70–99)
Glucose-Capillary: 246 mg/dL — ABNORMAL HIGH (ref 70–99)
Glucose-Capillary: 276 mg/dL — ABNORMAL HIGH (ref 70–99)

## 2019-05-10 LAB — CBC
HCT: 36 % (ref 36.0–46.0)
Hemoglobin: 10.9 g/dL — ABNORMAL LOW (ref 12.0–15.0)
MCH: 27.5 pg (ref 26.0–34.0)
MCHC: 30.3 g/dL (ref 30.0–36.0)
MCV: 90.9 fL (ref 80.0–100.0)
Platelets: 120 10*3/uL — ABNORMAL LOW (ref 150–400)
RBC: 3.96 MIL/uL (ref 3.87–5.11)
RDW: 13.7 % (ref 11.5–15.5)
WBC: 11 10*3/uL — ABNORMAL HIGH (ref 4.0–10.5)
nRBC: 0.3 % — ABNORMAL HIGH (ref 0.0–0.2)

## 2019-05-10 LAB — D-DIMER, QUANTITATIVE: D-Dimer, Quant: 1.92 ug/mL-FEU — ABNORMAL HIGH (ref 0.00–0.50)

## 2019-05-10 LAB — TRIGLYCERIDES: Triglycerides: 940 mg/dL — ABNORMAL HIGH (ref <150)

## 2019-05-10 LAB — HEPARIN LEVEL (UNFRACTIONATED): Heparin Unfractionated: 0.66 IU/mL (ref 0.30–0.70)

## 2019-05-10 MED ORDER — ACETAMINOPHEN 325 MG PO TABS
650.0000 mg | ORAL_TABLET | ORAL | Status: DC | PRN
  Administered 2019-05-10 – 2019-05-15 (×10): 650 mg
  Filled 2019-05-10 (×10): qty 2

## 2019-05-10 MED ORDER — VANCOMYCIN HCL IN DEXTROSE 1-5 GM/200ML-% IV SOLN
1000.0000 mg | Freq: Two times a day (BID) | INTRAVENOUS | Status: DC
  Administered 2019-05-11: 1000 mg via INTRAVENOUS
  Filled 2019-05-10 (×2): qty 200

## 2019-05-10 MED ORDER — CISATRACURIUM BESYLATE (PF) 10 MG/5ML IV SOLN
10.0000 mg | INTRAVENOUS | Status: DC | PRN
  Administered 2019-05-10 – 2019-05-11 (×4): 10 mg via INTRAVENOUS
  Filled 2019-05-10 (×5): qty 5

## 2019-05-10 MED ORDER — POLYETHYLENE GLYCOL 3350 17 G PO PACK
17.0000 g | PACK | Freq: Every day | ORAL | Status: DC
  Administered 2019-05-10 – 2019-05-15 (×5): 17 g via ORAL
  Filled 2019-05-10 (×5): qty 1

## 2019-05-10 MED ORDER — INSULIN ASPART 100 UNIT/ML ~~LOC~~ SOLN
5.0000 [IU] | SUBCUTANEOUS | Status: DC
  Administered 2019-05-10 – 2019-05-12 (×11): 5 [IU] via SUBCUTANEOUS

## 2019-05-10 MED ORDER — VANCOMYCIN HCL 2000 MG/400ML IV SOLN
2000.0000 mg | Freq: Once | INTRAVENOUS | Status: AC
  Administered 2019-05-10: 2000 mg via INTRAVENOUS
  Filled 2019-05-10: qty 400

## 2019-05-10 MED ORDER — IBUPROFEN 100 MG/5ML PO SUSP
400.0000 mg | Freq: Three times a day (TID) | ORAL | Status: DC | PRN
  Administered 2019-05-10 – 2019-05-15 (×4): 400 mg via ORAL
  Filled 2019-05-10 (×6): qty 20

## 2019-05-10 NOTE — Progress Notes (Signed)
ANTICOAGULATION CONSULT NOTE   Pharmacy Consult for heparin Indication: Suspected PE  Allergies  Allergen Reactions  . Levalbuterol Shortness Of Breath  . Buspar [Buspirone Hcl]      Fainting feelings   . Sulfamethoxazole     REACTION: unspecified  . Sulfonamide Derivatives     REACTION: rash    Patient Measurements: Height: 5\' 2"  (157.5 cm) Weight: 263 lb 0.1 oz (119.3 kg) IBW/kg (Calculated) : 50.1 Heparin Dosing Weight: 81kg  Vital Signs: Temp: 103.2 F (39.6 C) (01/26 0400) Temp Source: Oral (01/26 0400) BP: 123/65 (01/26 0500) Pulse Rate: 82 (01/26 0500)  Labs: Recent Labs    05/08/19 0505 05/08/19 05/10/19 05/09/19 0436 05/09/19 0436 05/09/19 1352 05/09/19 1352 05/09/19 1812 05/10/19 0420  HGB 11.2*   < > 10.9*   < > 10.9*   < > 10.9* 10.9*  HCT 35.7*   < > 36.0   < > 32.0*  --  32.0* 36.0  PLT 131*  --  137*  --   --   --   --  120*  HEPARINUNFRC 0.37  --  0.61  --   --   --   --  0.66  CREATININE 0.76  --  0.75  --   --   --   --   --    < > = values in this interval not displayed.    Estimated Creatinine Clearance: 111.4 mL/min (by C-G formula based on SCr of 0.75 mg/dL).   Assessment: Pt was transferred to the ICU 05/05/22 after decompensation. She is morbidly obese. She coded on 2022-05-05 requiring 50 mg IV tPA for suspected PE and was started on IV heparin. She had a repeat code blue event on 1/23 requiring 100 mg IV tPA.   Heparin level remains therapeutic (0.66) on gtt at 1500 units/hr. No bleeding noted.   Goal of Therapy:  Heparin level 0.3-0.7 units/ml Monitor platelets by anticoagulation protocol: Yes   Plan:  Continue IV heparin 1500 units/hr.  Will f/u daily heparin level and CBC  Thanks for allowing pharmacy to be a part of this patient's care.  2/23, PharmD Clinical Pharmacist  05/10/2019 6:14 AM

## 2019-05-10 NOTE — Progress Notes (Signed)
No prone per MD Ogan, tube holder placed to secure tube.

## 2019-05-10 NOTE — Progress Notes (Signed)
NAME:  [name], MRN:  [id number], DOB:  May 01, 1975, LOS: 7 ADMISSION DATE:  04/29/2019, CONSULTATION DATE:  1/21 REFERRING MD:  Bonner Puna, CHIEF COMPLAINT:  dyspnea   Brief History   44 yo female found to be positive for COVID 19 on 04/26/19 developed progressive dyspnea associated with lethargy.  In ER found to have hypoxia.  Admitted to [facility] on 05/02/2019.  Started on decadron and remdesivir.  Treated with tocilizumab and convalescent plasma.  Developed worsening hypoxia and mental status, and required intubation 05/05/19.  Past Medical History  Pre-DM, HTN, Asthma, Obesity  Significant Hospital Events   1/19 Admit 1/20 Transfer to ICU 1/21 VDRF, ARDS protocol-->emergently intubated. Had 6 minute cardiac arrest w/ prolonged time w/ pulse ox in 30s. Given IV TPA w/ concern for PE 1/22 fully awake. Initially asking nursing staff about stopping. After discussing plan of care pt agreeable to aggressive care as long as no evidence of organ failure and on-going evidence of improvement. Starting heavy sedation goal and prone protocol. Checking echo and LE dopplers. Changing propofol to versed and fent at TG increased.  Later in the day developed acute hypoxemia again, repeat bedside echo showed worsening RV dysfunction, given TPA again.  1/23 worsening oxygenation  Consults:  PCCM  Procedures:  ETT 1/21 >>  L IJ CVL 1/22 >>  Significant Diagnostic Tests:  ECHO 1/22: LVEF 60-65%, otherwise normal.  LE dopplers 1/23> positive L popliteal thrombus  Micro Data:  SARS CoV 2 NAA 1/12 >> detected RVP 1/19 >> negative Resp 1/22: GPC Blood 1/19: negative resp 1/25:  Antimicrobials/COVID Rx:  Remdesivir 1/19 >>1/24 Decadron 1/19 >> Tocilizumab 1/19 Convalescent plasma 1/20   Zosyn 1/22 > 1/24, restarted 1/25->  Interim history/subjective:   1/26: proning deferred 2/2 pneumomediastinum. tmax to 103.2, cooling blanket and fever now improved off cooling blanket. Remains on 70%/10 (down from  80/16). Trying to keep pressures/peep low 2/2 pneumomedistinum (which is stable to slightly improved today) 1/25: tmax 102.9 overnight. New pneumomediastinum noted on cxr this am.  1/24: oxygenation better, low grade fever  Objective   Blood pressure 123/68, pulse 81, temperature (S) (!) 103.2 F (39.6 C), temperature source Oral, resp. rate (!) 23, height 5\' 2"  (1.575 m), weight 119.3 kg, last menstrual period 04/10/2019, SpO2 92 %.    Vent Mode: PCV FiO2 (%):  [70 %] 70 % Set Rate:  [30 bmp-34 bmp] 30 bmp PEEP:  [10 cmH20] 10 cmH20 Plateau Pressure:  [22 cmH20-23 cmH20] 23 cmH20   Intake/Output Summary (Last 24 hours) at 05/10/2019 0853 Last data filed at 05/10/2019 0600 Gross per 24 hour  Intake 2967.88 ml  Output 1750 ml  Net 1217.88 ml   Filed Weights   05/04/19 0538 05/07/19 0500 05/10/19 0500  Weight: 123.8 kg 126.3 kg 119.3 kg    Examination:  General:  In bed on vent HENT: NCAT ETT in place PULM: b/l wheeze and rhonchi, vent supported breathing CV: RRR, no mgr GI: BS+, soft, nontender MSK: normal bulk and tone Neuro: sedated on vent   Resolved Hospital Problem list     Assessment & Plan:  ARDS due to COVID 19 pneumonia: significantly improved oxygenation Bacterial pneumonia? Unclear if she actually has this  Fever due to COVID Pneumomediastinum Continue mechanical ventilation per ARDS protocol Target TVol 6-8cc/kgIBW Target Plateau Pressure < 30cm H20 Target driving pressure less than 15 cm of water Target PaO2 55-65: titrate PEEP/FiO2 per protocol As long as PaO2 to FiO2 ratio is less than 1:150 position  in prone position for 16 hours a day(held 2/2 pneumomediastinum however we can continue should she warrant it today. ) Check CVP daily if CVL in place Target CVP less than 4, diurese as necessary Ventilator associated pneumonia prevention protocol -proning held overnight 2/2 pneumomediastinum (we can continue this despite pneumomediastinum in setting of  potential need for increase peep).   Need for sedation for mechanical vent Severe ventilator dyssynchrony PAD protocol RASS target -2 Continue oral sedatives today Continue fentanyl/versed  S/P Cardiac arrest on 1/21, likely acute PE, received TPA Bedside echo on 1/22 with severe hypoxemia showed WORSENING RV failure compared to study earlier in day, likely second PE, received TPA again LE doppler positive for DVT on 1/23 Continue heparin infusion -ddimer is down today.   dm2 with Hyperglycemia -SSI -adding tube feed coverage -a1c 6.8  Transaminitis:  -improving trend  Thrombocytopenia:  -noted -follow closely  Best practice:  Diet: tube feeding Pain/Anxiety/Delirium protocol (if indicated): RASS target -4 to -5 VAP protocol (if indicated): yes DVT prophylaxis: on heparin GI prophylaxis: famotidine Glucose control: SSI Mobility: bed rest Code Status: DNR if arrests Family Communication: spoke with husband via phone on 1/26 Disposition: remain in ICU  Labs   CBC: Recent Labs  Lab 05/11/2019 1842 05/10/2019 1842 05/04/19 0900 05/04/19 0900 05/05/19 0526 05/05/19 1649 05/06/19 0640 05/06/19 0640 05/07/19 0455 05/07/19 0455 05/08/19 0505 05/08/19 0608 05/08/19 2100 05/09/19 0436 05/09/19 1352 05/09/19 1812 05/10/19 0420  WBC 6.8   < > 5.5   < > 7.2  --  11.8*  --  12.0*  --  10.6*  --   --  10.7*  --   --  11.0*  NEUTROABS 5.2  --  4.2  --  5.8  --  9.1*  --   --   --   --   --   --  8.1*  --   --   --   HGB 12.6   < > 12.9   < > 13.0   < > 11.8*   < > 11.2*   < > 11.2*   < > 11.2* 10.9* 10.9* 10.9* 10.9*  HCT 40.2   < > 40.0   < > 40.5   < > 37.3   < > 36.0   < > 35.7*   < > 33.0* 36.0 32.0* 32.0* 36.0  MCV 86.1   < > 83.3   < > 84.9  --  85.4  --  89.1  --  88.4  --   --  90.2  --   --  90.9  PLT 214   < > 222   < > 303  --  179  --  127*  --  131*  --   --  137*  --   --  120*   < > = values in this interval not displayed.    Basic Metabolic  Panel: Recent Labs  Lab 05/05/19 1732 05/05/19 2052 05/06/19 0640 05/06/19 0640 05/06/19 1837 05/07/19 0455 05/07/19 0455 05/08/19 0505 05/08/19 2197 05/08/19 2100 05/09/19 0436 05/09/19 1352 05/09/19 1812 05/10/19 0420  NA   < >  --  141   < >  --  140   < > 143   < > 143 144 143 143 143  K   < >  --  3.7   < >  --  4.2   < > 4.1   < > 3.7 3.8 4.2 4.0 4.0  CL   < >  --  102  --   --  101  --  100  --   --  99  --   --  103  CO2  --   --  26  --   --  30  --  33*  --   --  33*  --   --  31  GLUCOSE   < >  --  160*  --   --  200*  --  237*  --   --  239*  --   --  208*  BUN   < >  --  18  --   --  22*  --  22*  --   --  27*  --   --  24*  CREATININE   < >  --  0.67  --   --  0.66  --  0.76  --   --  0.75  --   --  0.70  CALCIUM  --   --  7.9*  --   --  7.5*  --  7.6*  --   --  7.8*  --   --  7.6*  MG  --  2.3 2.1  --  2.6* 2.6*  --   --   --   --   --   --   --  2.6*  PHOS  --  5.3* 3.2  --  4.9* 3.6  --   --   --   --   --   --   --   --    < > = values in this interval not displayed.   GFR: Estimated Creatinine Clearance: 111.4 mL/min (by C-G formula based on SCr of 0.7 mg/dL). Recent Labs  Lab 05/15/2019 1842 04/28/2019 2110 05/04/19 0900 05/07/19 0455 05/08/19 0505 05/09/19 0436 05/10/19 0420  PROCALCITON <0.10  --   --   --   --  <0.10  --   WBC 6.8  --    < > 12.0* 10.6* 10.7* 11.0*  LATICACIDVEN 2.1* 1.7  --   --   --   --   --    < > = values in this interval not displayed.    Liver Function Tests: Recent Labs  Lab 05/06/19 0640 05/07/19 0455 05/08/19 0505 05/09/19 0436 05/10/19 0420  AST 66* 109* 107* 68* 57*  ALT 58* 82* 112* 94* 77*  ALKPHOS 120 141* 119 103 86  BILITOT 0.6 0.6 0.7 0.7 0.5  PROT 6.5 5.7* 5.8* 6.0* 5.5*  ALBUMIN 2.8* 2.6* 2.7* 2.9* 2.7*   No results for input(s): LIPASE, AMYLASE in the last 168 hours. No results for input(s): AMMONIA in the last 168 hours.  ABG    Component Value Date/Time   PHART 7.330 (L) 05/09/2019 1812    PCO2ART 60.0 (H) 05/09/2019 1812   PO2ART 66.0 (L) 05/09/2019 1812   HCO3 31.0 (H) 05/09/2019 1812   TCO2 33 (H) 05/09/2019 1812   O2SAT 88.0 05/09/2019 1812     Coagulation Profile: Recent Labs  Lab 05/06/19 1837  INR 1.7*    Cardiac Enzymes: No results for input(s): CKTOTAL, CKMB, CKMBINDEX, TROPONINI in the last 168 hours.  HbA1C: Hgb A1c MFr Bld  Date/Time Value Ref Range Status  04/16/2019 09:10 PM 6.8 (H) 4.8 - 5.6 % Final    Comment:    (NOTE) Pre diabetes:          5.7%-6.4% Diabetes:              >  6.4% Glycemic control for   <7.0% adults with diabetes   01/28/2019 08:32 AM 6.9 (H) 4.6 - 6.5 % Final    Comment:    Glycemic Control Guidelines for People with Diabetes:Non Diabetic:  <6%Goal of Therapy: <7%Additional Action Suggested:  >8%     CBG: Recent Labs  Lab 05/09/19 1641 05/09/19 2045 05/10/19 0027 05/10/19 0328 05/10/19 0728  GLUCAP 207* 153* 231* 217* 171*       Critical care time: 40 minutes    Critical care time: The patient is critically ill with multiple organ systems failure and requires high complexity decision making for assessment and support, frequent evaluation and titration of therapies, application of advanced monitoring technologies and extensive interpretation of multiple databases.  Critical care time 40 mins. This represents my time independent of the NPs time taking care of the pt. This is excluding procedures.    Briant Sites DO [facility] 05/10/2019, 8:53 AM

## 2019-05-10 NOTE — Progress Notes (Signed)
MD notified that fever not breaking despite acetaminophen and ice packs. Dr. Warrick Parisian to order a cooling blanket.

## 2019-05-10 NOTE — Progress Notes (Signed)
eLink Physician-Brief Progress Note Patient Name: [name] DOB: July 12, 1975 MRN: [id number]   Date of Service  05/10/2019  HPI/Events of Note  PE x 2 and pneumominiastinum independently increase risk of proning, Fio2 70 % , PO2 66 mmHg.  eICU Interventions  Will defer proning tonight.        Thomasene Lot Onesti Bonfiglio 05/10/2019, 2:35 AM

## 2019-05-10 NOTE — Progress Notes (Signed)
Increase made based on ABG result.. MD Ogan aware.

## 2019-05-10 NOTE — Progress Notes (Signed)
Pharmacy Antibiotic Note  [name] is a 44 y.o. female admitted on 04/24/2019 with pneumonia.  She is currently on day #5 of Zosyn for pneumonia.  Tracheal aspirate cultures show abundand staph aureus.  Pharmacy has been consulted for Vancomycin dosing.  Plan: Continue Zosyn 3.375g IV Q8H infused over 4hrs.  Vancomycin 2000mg  IV x1, followed by 1g IV q12h  Measure Vanc peak and trough at steady state. Goal AUC = 400 - 550. Estimated AUC 524 using rounded SCr 0.8 Follow up renal function, culture results, and clinical course.    Height: 5\' 2"  (157.5 cm) Weight: 263 lb 0.1 oz (119.3 kg) IBW/kg (Calculated) : 50.1  Temp (24hrs), Avg:100.6 F (38.1 C), Min:97.5 F (36.4 C), Max:103.2 F (39.6 C)  Recent Labs  Lab 04/16/2019 1842 05/15/2019 2110 05/04/19 0900 05/06/19 0640 05/07/19 0455 05/08/19 0505 05/09/19 0436 05/10/19 0420  WBC 6.8  --    < > 11.8* 12.0* 10.6* 10.7* 11.0*  CREATININE 0.69  --    < > 0.67 0.66 0.76 0.75 0.70  LATICACIDVEN 2.1* 1.7  --   --   --   --   --   --    < > = values in this interval not displayed.    Estimated Creatinine Clearance: 111.4 mL/min (by C-G formula based on SCr of 0.7 mg/dL).    Allergies  Allergen Reactions  . Levalbuterol Shortness Of Breath  . Buspar [Buspirone Hcl]      Fainting feelings   . Sulfamethoxazole     REACTION: unspecified  . Sulfonamide Derivatives     REACTION: rash    Antimicrobials this admission: 1/19 Remdesivir >> 1/23 1/19 Actemra 800mg  1/22 Zosyn >> 1/24, resumed 1/25 >> (1/29) 1/26 Vancomycin >>   Dose adjustments this admission:  Microbiology results: 1/19 BCx: NGF 1/19 Resp panel: none 1/20 MRSA PCR: neg 1/22 Resp >> (mod GPCs) Normal flora 1/25 TA: abundant staph aureus 1/26 BCx:   Thank you for allowing pharmacy to be a part of this patient's care.  2/22 PharmD, BCPS Clinical pharmacist phone 7am- 5pm: [phone] 05/10/2019 2:38 PM

## 2019-05-10 NOTE — Progress Notes (Signed)
eLink Physician-Brief Progress Note Patient Name: [name] DOB: [birth year] MRN: [id number]   Date of Service  05/10/2019  HPI/Events of Note  Tmax 103.2, delay in cooling blanket arrival.  eICU Interventions  Tylenol 650 mg via NGT Q 4 hours PRN fever, Ibuprofen 400 mg via NGT Q 8 hours PRN fever        Migdalia Dk 05/10/2019, 5:46 AM

## 2019-05-10 NOTE — Progress Notes (Signed)
eLink Physician-Brief Progress Note Patient Name: [name] DOB: 1975-09-21 MRN: [id number]   Date of Service  05/10/2019  HPI/Events of Note  Marked elevation of temp not optimally responsive to Tylenol  eICU Interventions  Cooling blanket ordered        Migdalia Dk 05/10/2019, 4:27 AM

## 2019-05-11 ENCOUNTER — Inpatient Hospital Stay (HOSPITAL_COMMUNITY): Payer: Managed Care, Other (non HMO)

## 2019-05-11 LAB — POCT I-STAT 7, (LYTES, BLD GAS, ICA,H+H)
Acid-Base Excess: 6 mmol/L — ABNORMAL HIGH (ref 0.0–2.0)
Acid-Base Excess: 5 mmol/L — ABNORMAL HIGH (ref 0.0–2.0)
Acid-Base Excess: 9 mmol/L — ABNORMAL HIGH (ref 0.0–2.0)
Acid-Base Excess: 14 mmol/L — ABNORMAL HIGH (ref 0.0–2.0)
Bicarbonate: 33.9 mmol/L — ABNORMAL HIGH (ref 20.0–28.0)
Bicarbonate: 32.9 mmol/L — ABNORMAL HIGH (ref 20.0–28.0)
Bicarbonate: 35.2 mmol/L — ABNORMAL HIGH (ref 20.0–28.0)
Bicarbonate: 40.7 mmol/L — ABNORMAL HIGH (ref 20.0–28.0)
Calcium, Ion: 1.17 mmol/L (ref 1.15–1.40)
Calcium, Ion: 1.2 mmol/L (ref 1.15–1.40)
Calcium, Ion: 1.18 mmol/L (ref 1.15–1.40)
Calcium, Ion: 1.17 mmol/L (ref 1.15–1.40)
HCT: 33 % — ABNORMAL LOW (ref 36.0–46.0)
HCT: 35 % — ABNORMAL LOW (ref 36.0–46.0)
HCT: 31 % — ABNORMAL LOW (ref 36.0–46.0)
HCT: 30 % — ABNORMAL LOW (ref 36.0–46.0)
Hemoglobin: 11.2 g/dL — ABNORMAL LOW (ref 12.0–15.0)
Hemoglobin: 11.9 g/dL — ABNORMAL LOW (ref 12.0–15.0)
Hemoglobin: 10.5 g/dL — ABNORMAL LOW (ref 12.0–15.0)
Hemoglobin: 10.2 g/dL — ABNORMAL LOW (ref 12.0–15.0)
O2 Saturation: 81 %
O2 Saturation: 62 %
O2 Saturation: 88 %
O2 Saturation: 94 %
Patient temperature: 37.5
Patient temperature: 38.4
Patient temperature: 36.4
Patient temperature: 37.6
Potassium: 4.6 mmol/L (ref 3.5–5.1)
Potassium: 4.6 mmol/L (ref 3.5–5.1)
Potassium: 4.4 mmol/L (ref 3.5–5.1)
Potassium: 5 mmol/L (ref 3.5–5.1)
Sodium: 140 mmol/L (ref 135–145)
Sodium: 140 mmol/L (ref 135–145)
Sodium: 138 mmol/L (ref 135–145)
Sodium: 134 mmol/L — ABNORMAL LOW (ref 135–145)
TCO2: 36 mmol/L — ABNORMAL HIGH (ref 22–32)
TCO2: 35 mmol/L — ABNORMAL HIGH (ref 22–32)
TCO2: 37 mmol/L — ABNORMAL HIGH (ref 22–32)
TCO2: 43 mmol/L — ABNORMAL HIGH (ref 22–32)
pCO2 arterial: 64.7 mmHg — ABNORMAL HIGH (ref 32.0–48.0)
pCO2 arterial: 67.8 mmHg (ref 32.0–48.0)
pCO2 arterial: 52.2 mmHg — ABNORMAL HIGH (ref 32.0–48.0)
pCO2 arterial: 62.8 mmHg — ABNORMAL HIGH (ref 32.0–48.0)
pH, Arterial: 7.329 — ABNORMAL LOW (ref 7.350–7.450)
pH, Arterial: 7.301 — ABNORMAL LOW (ref 7.350–7.450)
pH, Arterial: 7.434 (ref 7.350–7.450)
pH, Arterial: 7.423 (ref 7.350–7.450)
pO2, Arterial: 51 mmHg — ABNORMAL LOW (ref 83.0–108.0)
pO2, Arterial: 40 mmHg — CL (ref 83.0–108.0)
pO2, Arterial: 52 mmHg — ABNORMAL LOW (ref 83.0–108.0)
pO2, Arterial: 73 mmHg — ABNORMAL LOW (ref 83.0–108.0)

## 2019-05-11 LAB — CULTURE, RESPIRATORY W GRAM STAIN: Special Requests: NORMAL

## 2019-05-11 LAB — PROCALCITONIN: Procalcitonin: 0.16 ng/mL

## 2019-05-11 LAB — CBC
HCT: 37.1 % (ref 36.0–46.0)
Hemoglobin: 11.3 g/dL — ABNORMAL LOW (ref 12.0–15.0)
MCH: 27.5 pg (ref 26.0–34.0)
MCHC: 30.5 g/dL (ref 30.0–36.0)
MCV: 90.3 fL (ref 80.0–100.0)
Platelets: 118 10*3/uL — ABNORMAL LOW (ref 150–400)
RBC: 4.11 MIL/uL (ref 3.87–5.11)
RDW: 13.5 % (ref 11.5–15.5)
WBC: 11.6 10*3/uL — ABNORMAL HIGH (ref 4.0–10.5)
nRBC: 0.5 % — ABNORMAL HIGH (ref 0.0–0.2)

## 2019-05-11 LAB — COMPREHENSIVE METABOLIC PANEL
ALT: 80 U/L — ABNORMAL HIGH (ref 0–44)
AST: 52 U/L — ABNORMAL HIGH (ref 15–41)
Albumin: 2.8 g/dL — ABNORMAL LOW (ref 3.5–5.0)
Alkaline Phosphatase: 111 U/L (ref 38–126)
Anion gap: 16 — ABNORMAL HIGH (ref 5–15)
BUN: 23 mg/dL — ABNORMAL HIGH (ref 6–20)
CO2: 27 mmol/L (ref 22–32)
Calcium: 8.1 mg/dL — ABNORMAL LOW (ref 8.9–10.3)
Chloride: 99 mmol/L (ref 98–111)
Creatinine, Ser: 0.69 mg/dL (ref 0.44–1.00)
GFR calc Af Amer: >60 mL/min (ref >60)
GFR calc non Af Amer: >60 mL/min (ref >60)
Glucose, Bld: 229 mg/dL — ABNORMAL HIGH (ref 70–99)
Potassium: 4.4 mmol/L (ref 3.5–5.1)
Sodium: 142 mmol/L (ref 135–145)
Total Bilirubin: 0.7 mg/dL (ref 0.3–1.2)
Total Protein: 5.8 g/dL — ABNORMAL LOW (ref 6.5–8.1)

## 2019-05-11 LAB — HEPARIN LEVEL (UNFRACTIONATED)
Heparin Unfractionated: 0.72 IU/mL — ABNORMAL HIGH (ref 0.30–0.70)
Heparin Unfractionated: 0.47 IU/mL (ref 0.30–0.70)

## 2019-05-11 LAB — GLUCOSE, CAPILLARY
Glucose-Capillary: 176 mg/dL — ABNORMAL HIGH (ref 70–99)
Glucose-Capillary: 191 mg/dL — ABNORMAL HIGH (ref 70–99)
Glucose-Capillary: 225 mg/dL — ABNORMAL HIGH (ref 70–99)
Glucose-Capillary: 225 mg/dL — ABNORMAL HIGH (ref 70–99)
Glucose-Capillary: 239 mg/dL — ABNORMAL HIGH (ref 70–99)
Glucose-Capillary: 298 mg/dL — ABNORMAL HIGH (ref 70–99)

## 2019-05-11 LAB — TRIGLYCERIDES: Triglycerides: 1005 mg/dL — ABNORMAL HIGH (ref <150)

## 2019-05-11 LAB — D-DIMER, QUANTITATIVE: D-Dimer, Quant: 1.55 ug/mL-FEU — ABNORMAL HIGH (ref 0.00–0.50)

## 2019-05-11 MED ORDER — CEFAZOLIN SODIUM-DEXTROSE 2-4 GM/100ML-% IV SOLN
2.0000 g | Freq: Three times a day (TID) | INTRAVENOUS | Status: DC
  Administered 2019-05-11 – 2019-05-15 (×12): 2 g via INTRAVENOUS
  Filled 2019-05-11 (×15): qty 100

## 2019-05-11 MED ORDER — MIDAZOLAM 50MG/50ML (1MG/ML) PREMIX INFUSION
2.0000 mg/h | INTRAVENOUS | Status: DC
  Administered 2019-05-11 – 2019-05-13 (×9): 10 mg/h via INTRAVENOUS
  Administered 2019-05-13: 7 mg/h via INTRAVENOUS
  Administered 2019-05-13: 9 mg/h via INTRAVENOUS
  Administered 2019-05-14: 4 mg/h via INTRAVENOUS
  Administered 2019-05-14 – 2019-05-15 (×3): 6 mg/h via INTRAVENOUS
  Administered 2019-05-15: 7 mg/h via INTRAVENOUS
  Administered 2019-05-15: 6 mg/h via INTRAVENOUS
  Administered 2019-05-16: 10 mg/h via INTRAVENOUS
  Administered 2019-05-16 – 2019-05-17 (×4): 8 mg/h via INTRAVENOUS
  Filled 2019-05-11 (×22): qty 50

## 2019-05-11 MED ORDER — FENTANYL 2500MCG IN NS 250ML (10MCG/ML) PREMIX INFUSION
50.0000 ug/h | INTRAVENOUS | Status: DC
  Administered 2019-05-11 – 2019-05-13 (×5): 200 ug/h via INTRAVENOUS
  Administered 2019-05-14: 175 ug/h via INTRAVENOUS
  Administered 2019-05-14: 200 ug/h via INTRAVENOUS
  Filled 2019-05-11 (×6): qty 250

## 2019-05-11 MED ORDER — SODIUM CHLORIDE 0.9% FLUSH: 10.0000 mL | INTRAVENOUS | Status: DC | PRN

## 2019-05-11 MED ORDER — ROCURONIUM BROMIDE 50 MG/5ML IV SOLN
100.0000 mg | Freq: Once | INTRAVENOUS | Status: DC
  Filled 2019-05-11: qty 10

## 2019-05-11 MED ORDER — MIDAZOLAM BOLUS VIA INFUSION
1.0000 mg | INTRAVENOUS | Status: DC | PRN
  Filled 2019-05-11: qty 2

## 2019-05-11 MED ORDER — FUROSEMIDE 10 MG/ML IJ SOLN
40.0000 mg | Freq: Once | INTRAMUSCULAR | Status: AC
  Administered 2019-05-11: 40 mg via INTRAVENOUS
  Filled 2019-05-11: qty 4

## 2019-05-11 MED ORDER — ARTIFICIAL TEARS OPHTHALMIC OINT
1.0000 "application " | TOPICAL_OINTMENT | Freq: Three times a day (TID) | OPHTHALMIC | Status: DC
  Administered 2019-05-11 – 2019-05-17 (×18): 1 via OPHTHALMIC
  Filled 2019-05-11 (×2): qty 3.5

## 2019-05-11 MED ORDER — SODIUM CHLORIDE 0.9% FLUSH
10.0000 mL | Freq: Two times a day (BID) | INTRAVENOUS | Status: DC
  Administered 2019-05-11 – 2019-05-17 (×12): 10 mL

## 2019-05-11 MED ORDER — FENTANYL BOLUS VIA INFUSION
50.0000 ug | INTRAVENOUS | Status: DC | PRN
  Filled 2019-05-11: qty 50

## 2019-05-11 MED ORDER — PRO-STAT SUGAR FREE PO LIQD
30.0000 mL | Freq: Every day | ORAL | Status: DC
  Administered 2019-05-11 – 2019-05-17 (×30): 30 mL
  Filled 2019-05-11 (×28): qty 30

## 2019-05-11 MED ORDER — SODIUM CHLORIDE 0.9 % IV SOLN
0.0000 ug/kg/min | INTRAVENOUS | Status: DC
  Administered 2019-05-11 – 2019-05-14 (×8): 3 ug/kg/min via INTRAVENOUS
  Administered 2019-05-14: 3.5 ug/kg/min via INTRAVENOUS
  Administered 2019-05-15 (×2): 3 ug/kg/min via INTRAVENOUS
  Administered 2019-05-16 – 2019-05-17 (×5): 4.5 ug/kg/min via INTRAVENOUS
  Filled 2019-05-11 (×22): qty 20

## 2019-05-11 MED ORDER — ROCURONIUM BROMIDE 10 MG/ML (PF) SYRINGE
PREFILLED_SYRINGE | INTRAVENOUS | Status: AC
  Administered 2019-05-11: 100 mg
  Filled 2019-05-11: qty 10

## 2019-05-11 MED ORDER — FUROSEMIDE 10 MG/ML IJ SOLN
40.0000 mg | Freq: Once | INTRAMUSCULAR | Status: AC
  Administered 2019-05-11: 04:00:00 40 mg via INTRAVENOUS
  Filled 2019-05-11: qty 4

## 2019-05-11 MED ORDER — HEPARIN (PORCINE) 25000 UT/250ML-% IV SOLN
1300.0000 [IU]/h | INTRAVENOUS | Status: DC
  Administered 2019-05-11 (×2): 1400 [IU]/h via INTRAVENOUS
  Administered 2019-05-12: 1300 [IU]/h via INTRAVENOUS
  Filled 2019-05-11 (×2): qty 250

## 2019-05-11 MED ORDER — FAMOTIDINE 40 MG/5ML PO SUSR
20.0000 mg | Freq: Every day | ORAL | Status: DC
  Administered 2019-05-12 – 2019-05-17 (×6): 20 mg
  Filled 2019-05-11 (×6): qty 2.5

## 2019-05-11 NOTE — Progress Notes (Signed)
NAME:  [name], MRN:  [id number], DOB:  October 16, 1975, LOS: 8 ADMISSION DATE:  2019-05-08, CONSULTATION DATE:  1/21 REFERRING MD:  Jarvis Newcomer, CHIEF COMPLAINT:  dyspnea   Brief History   44 yo female found to be positive for COVID 19 on 04/26/19 developed progressive dyspnea associated with lethargy.  In ER found to have hypoxia.  Admitted to [facility] on 2019/05/08.  Started on decadron and remdesivir.  Treated with tocilizumab and convalescent plasma.  Developed worsening hypoxia and mental status, and required intubation 05/05/19.  Past Medical History  Pre-DM, HTN, Asthma, Obesity  Significant Hospital Events   1/19 Admit 1/20 Transfer to ICU 1/21 VDRF, ARDS protocol-->emergently intubated. Had 6 minute cardiac arrest w/ prolonged time w/ pulse ox in 30s. Given IV TPA w/ concern for PE 1/22 fully awake. Initially asking nursing staff about stopping. After discussing plan of care pt agreeable to aggressive care as long as no evidence of organ failure and on-going evidence of improvement. Starting heavy sedation goal and prone protocol. Checking echo and LE dopplers. Changing propofol to versed and fent at TG increased.  Later in the day developed acute hypoxemia again, repeat bedside echo showed worsening RV dysfunction, given TPA again.  1/23 worsening oxygenation  Consults:  PCCM  Procedures:  ETT 1/21 >>  L IJ CVL 1/22 >>  Significant Diagnostic Tests:  ECHO 1/22: LVEF 60-65%, otherwise normal.  LE dopplers 1/23> positive L popliteal thrombus  Micro Data:  SARS CoV 2 NAA 1/12 >> detected RVP 1/19 >> negative Resp 1/22: GPC Blood 1/19: negative resp 1/25: MSSA  Antimicrobials/COVID Rx:  Remdesivir 1/19 >>1/24 Decadron 1/19 >> Tocilizumab 1/19 Convalescent plasma 1/20   Zosyn 1/22 > 1/24, restarted 1/25-> 1/27 vanc 1/26->1/27 Ancef 1/27->  Interim history/subjective:  1/27: overnight with desaturations. Started on continuous nimbex with better vent synchrony and oxygenation.  Able to wean peep from 12-14 as well. Tmax101.5 yesterday.  1/26: proning deferred 2/2 pneumomediastinum. tmax to 103.2, cooling blanket and fever now improved off cooling blanket. Remains on 70%/10 (down from 80/16). Trying to keep pressures/peep low 2/2 pneumomedistinum (which is stable to slightly improved today) 1/25: tmax 102.9 overnight. New pneumomediastinum noted on cxr this am.  1/24: oxygenation better, low grade fever  Objective   Blood pressure (!) 124/56, pulse 75, temperature 99 F (37.2 C), temperature source Esophageal, resp. rate (!) 36, height 5\' 2"  (1.575 m), weight 119.3 kg, SpO2 92 %.    Vent Mode: PRVC FiO2 (%):  [70 %-100 %] 100 % Set Rate:  [30 bmp] 30 bmp Vt Set:  [440 mL] 440 mL PEEP:  [10 cmH20-12 cmH20] 10 cmH20 Plateau Pressure:  [21 cmH20-30 cmH20] 21 cmH20   Intake/Output Summary (Last 24 hours) at 05/11/2019 0932 Last data filed at 05/11/2019 0800 Gross per 24 hour  Intake 1432.07 ml  Output 3025 ml  Net -1592.93 ml   Filed Weights   05/04/19 0538 05/07/19 0500 05/10/19 0500  Weight: 123.8 kg 126.3 kg 119.3 kg    Examination:  General:  In bed on vent HENT: NCAT ETT in place, Bluish discoloration to R side of face (abg checked and within usual range for her po2 50s') PULM: b/l wheeze and rhonchi, vent supported breathing CV: RRR, no mgr GI: BS+, soft, nontender MSK: normal bulk and tone Neuro: sedated on vent   Resolved Hospital Problem list     Assessment & Plan:  ARDS due to COVID 19 pneumonia: significantly improved oxygenation Bacterial pneumonia? Unclear if she actually has  this  Fever due to COVID Pneumomediastinum Continue mechanical ventilation per ARDS protocol Target TVol 6-8cc/kgIBW Target Plateau Pressure < 30cm H20 Target driving pressure less than 15 cm of water Target PaO2 55-65: titrate PEEP/FiO2 per protocol As long as PaO2 to FiO2 ratio is less than 1:150 position in prone position for 16 hours a day(held 2/2  pneumomediastinum however we can continue should she warrant it today. ) Check CVP daily if CVL in place Target CVP less than 4, diurese as necessary, given 40 this am may redose this afternoon.  Ventilator associated pneumonia prevention protocol -cont with proning protocol -pneumomediastinum stable  -started continuous paralysis overnight Need for sedation for mechanical vent Severe ventilator dyssynchrony PAD protocol RASS target -2 Continue fentanyl/versed  S/P Cardiac arrest on 1/21, likely acute PE, received TPA Bedside echo on 1/22 with severe hypoxemia showed WORSENING RV failure compared to study earlier in day, likely second PE, received TPA again LE doppler positive for DVT on 1/23 Continue heparin infusion -ddimer is down today.   dm2 with Hyperglycemia -SSI with tube feed coverage -a1c 6.8  Transaminitis:  -improving trend  Thrombocytopenia:  -noted -follow closely  Best practice:  Diet: tube feeding Pain/Anxiety/Delirium protocol (if indicated): RASS target -4 to -5 VAP protocol (if indicated): yes DVT prophylaxis: on heparin GI prophylaxis: famotidine Glucose control: SSI Mobility: bed rest Code Status: DNR if arrests Family Communication: spoke with husband via phone on 1/27 Disposition: remain in ICU  Labs   CBC: Recent Labs  Lab 05/05/19 0526 05/05/19 1649 05/06/19 0640 05/06/19 0640 05/07/19 0455 05/07/19 0455 05/08/19 0505 05/08/19 1856 05/09/19 0436 05/09/19 1352 05/09/19 1812 05/10/19 0420 05/10/19 2315 05/11/19 0430 05/11/19 0448  WBC 7.2  --  11.8*   < > 12.0*  --  10.6*  --  10.7*  --   --  11.0*  --  11.6*  --   NEUTROABS 5.8  --  9.1*  --   --   --   --   --  8.1*  --   --   --   --   --   --   HGB 13.0   < > 11.8*   < > 11.2*   < > 11.2*   < > 10.9*   < > 10.9* 10.9* 11.2* 11.3* 11.9*  HCT 40.5   < > 37.3   < > 36.0   < > 35.7*   < > 36.0   < > 32.0* 36.0 33.0* 37.1 35.0*  MCV 84.9  --  85.4   < > 89.1  --  88.4  --  90.2   --   --  90.9  --  90.3  --   PLT 303  --  179   < > 127*  --  131*  --  137*  --   --  120*  --  118*  --    < > = values in this interval not displayed.    Basic Metabolic Panel: Recent Labs  Lab 05/05/19 1732 05/05/19 2052 05/06/19 0640 05/06/19 0640 05/06/19 1837 05/07/19 0455 05/07/19 0455 05/08/19 0505 05/08/19 3149 05/09/19 0436 05/09/19 1352 05/09/19 1812 05/10/19 0420 05/10/19 2315 05/11/19 0430 05/11/19 0448  NA   < >  --  141   < >  --  140   < > 143   < > 144   < > 143 143 140 142 140  K   < >  --  3.7   < >  --  4.2   < > 4.1   < > 3.8   < > 4.0 4.0 4.6 4.4 4.6  CL   < >  --  102   < >  --  101  --  100  --  99  --   --  103  --  99  --   CO2  --   --  26   < >  --  30  --  33*  --  33*  --   --  31  --  27  --   GLUCOSE   < >  --  160*   < >  --  200*  --  237*  --  239*  --   --  208*  --  229*  --   BUN   < >  --  18   < >  --  22*  --  22*  --  27*  --   --  24*  --  23*  --   CREATININE   < >  --  0.67   < >  --  0.66  --  0.76  --  0.75  --   --  0.70  --  0.69  --   CALCIUM  --   --  7.9*   < >  --  7.5*  --  7.6*  --  7.8*  --   --  7.6*  --  8.1*  --   MG  --  2.3 2.1  --  2.6* 2.6*  --   --   --   --   --   --  2.6*  --   --   --   PHOS  --  5.3* 3.2  --  4.9* 3.6  --   --   --   --   --   --   --   --   --   --    < > = values in this interval not displayed.   GFR: Estimated Creatinine Clearance: 111.4 mL/min (by C-G formula based on SCr of 0.69 mg/dL). Recent Labs  Lab 05/08/19 0505 05/09/19 0436 05/10/19 0420 05/11/19 0430  PROCALCITON  --  <0.10 <0.10 0.16  WBC 10.6* 10.7* 11.0* 11.6*    Liver Function Tests: Recent Labs  Lab 05/07/19 0455 05/08/19 0505 05/09/19 0436 05/10/19 0420 05/11/19 0430  AST 109* 107* 68* 57* 52*  ALT 82* 112* 94* 77* 80*  ALKPHOS 141* 119 103 86 111  BILITOT 0.6 0.7 0.7 0.5 0.7  PROT 5.7* 5.8* 6.0* 5.5* 5.8*  ALBUMIN 2.6* 2.7* 2.9* 2.7* 2.8*   No results for input(s): LIPASE, AMYLASE in the last 168  hours. No results for input(s): AMMONIA in the last 168 hours.  ABG    Component Value Date/Time   PHART 7.301 (L) 05/11/2019 0448   PCO2ART 67.8 (HH) 05/11/2019 0448   PO2ART 40.0 (LL) 05/11/2019 0448   HCO3 32.9 (H) 05/11/2019 0448   TCO2 35 (H) 05/11/2019 0448   O2SAT 62.0 05/11/2019 0448     Coagulation Profile: Recent Labs  Lab 05/06/19 1837  INR 1.7*    Cardiac Enzymes: No results for input(s): CKTOTAL, CKMB, CKMBINDEX, TROPONINI in the last 168 hours.  HbA1C: Hgb A1c MFr Bld  Date/Time Value Ref Range Status  05/09/19 09:10 PM 6.8 (H) 4.8 - 5.6 % Final    Comment:    (NOTE) Pre diabetes:  5.7%-6.4% Diabetes:              >6.4% Glycemic control for   <7.0% adults with diabetes   01/28/2019 08:32 AM 6.9 (H) 4.6 - 6.5 % Final    Comment:    Glycemic Control Guidelines for People with Diabetes:Non Diabetic:  <6%Goal of Therapy: <7%Additional Action Suggested:  >8%     CBG: Recent Labs  Lab 05/10/19 1204 05/10/19 1452 05/10/19 1937 05/10/19 2355 05/11/19 0827  GLUCAP 276* 246* 193* 225* 176*       Critical care time: 35 minutes    Critical care time: The patient is critically ill with multiple organ systems failure and requires high complexity decision making for assessment and support, frequent evaluation and titration of therapies, application of advanced monitoring technologies and extensive interpretation of multiple databases.  Critical care time 45 mins. This represents my time independent of the NPs time taking care of the pt. This is excluding procedures.    Briant Sites DO [facility] 05/11/2019, 9:32 AM

## 2019-05-11 NOTE — Progress Notes (Signed)
Inpatient Diabetes Program Recommendations  AACE/ADA: New Consensus Statement on Inpatient Glycemic Control (2015)  Target Ranges:  Prepandial:   less than 140 mg/dL      Peak postprandial:   less than 180 mg/dL (1-2 hours)      Critically ill patients:  140 - 180 mg/dL   Lab Results  Component Value Date   GLUCAP 239 (H) 05/11/2019   HGBA1C 6.8 (H) 04/30/2019    Review of Glycemic Control Results for LAM, [id number] (MRN [id number]) as of 05/11/2019 14:30  Ref. Range 05/10/2019 19:37 05/10/2019 23:55 05/11/2019 05:19 05/11/2019 08:27 05/11/2019 11:17  Glucose-Capillary Latest Ref Range: 70 - 99 mg/dL 023 (H) 343 (H) 568 (H) 176 (H) 239 (H)  Current orders for Inpatient glycemic control: Levemir 19 units BID, Novolog 0-20 units Q4H, Novolog 5 units Q4H; Decadron 6 mg Q12H; Tube Feeding @ 40 ml/hr  Inpatient Diabetes Program Recommendations:    Please consider increasing Levemir to 25 units bid.   Thanks  Beryl Meager, RN, BC-ADM Inpatient Diabetes Coordinator Pager [phone] (8a-5p)

## 2019-05-11 NOTE — Progress Notes (Addendum)
Called to assess patient for desaturation.  She was experiencing ventilator dyssynchrony and desaturation prior to my arrival.  Repeat Chest xray shows worsening airspace disease with stable pneumomediastinum. Improved also with increasing sedation and bolus rocuronium.  Reproned at 5am. Plan for rocuronium infusion given improvement. Patient already on maximum sedation of versed and fentanyl infusion. Current settings 100% FiO2, and downtitrated PEEP from 14 to 12.   [name]

## 2019-05-11 NOTE — Care Plan (Signed)
Updated [name] by phone tonight.

## 2019-05-11 NOTE — Progress Notes (Signed)
ANTICOAGULATION CONSULT NOTE   Pharmacy Consult for heparin Indication: PE/DVT  Allergies  Allergen Reactions  . Levalbuterol Shortness Of Breath  . Buspar [Buspirone Hcl]      Fainting feelings   . Sulfamethoxazole     REACTION: unspecified  . Sulfonamide Derivatives     REACTION: rash    Patient Measurements: Height: 5\' 2"  (157.5 cm) Weight: 263 lb 0.1 oz (119.3 kg) IBW/kg (Calculated) : 50.1 Heparin Dosing Weight: 81kg  Vital Signs: Temp: 99 F (37.2 C) (01/27 0801) Temp Source: Esophageal (01/27 0801) BP: 124/56 (01/27 0801) Pulse Rate: 75 (01/27 0801)  Labs: Recent Labs    05/09/19 0436 05/09/19 1352 05/10/19 0420 05/10/19 2315 05/11/19 0430 05/11/19 0430 05/11/19 0448 05/11/19 1020  HGB 10.9*   < > 10.9*   < > 11.3*   < > 11.9* 10.5*  HCT 36.0   < > 36.0   < > 37.1  --  35.0* 31.0*  PLT 137*  --  120*  --  118*  --   --   --   HEPARINUNFRC 0.61  --  0.66  --  0.72*  --   --   --   CREATININE 0.75  --  0.70  --  0.69  --   --   --    < > = values in this interval not displayed.    Estimated Creatinine Clearance: 111.4 mL/min (by C-G formula based on SCr of 0.69 mg/dL).   Assessment: Pt was transferred to the ICU 05/20/22 after decompensation. She is morbidly obese. She coded on 20-May-2022 requiring 50 mg IV tPA for suspected PE and was started on IV heparin. She had a repeat code blue event on 1/23 requiring 100 mg IV tPA.  Dopplers were positive for DVT on 1/23.  Heparin level 0.72, increased to supratherapeutic on heparin at 1500 units/hr. CBC: Hgb stable at 11.3, Plt continue to decrease to 118k No major bleeding or complications reported.  RN reports some bruising and blood tinged sputum.  Goal of Therapy:  Heparin level 0.3-0.7 units/ml Monitor platelets by anticoagulation protocol: Yes   Plan:  Decrease to heparin IV infusion at 1400 units/hr Heparin level 6 hours after starting Daily heparin level and CBC  Thanks for allowing pharmacy to be a part  of this patient's care.  2/23 PharmD, BCPS Clinical pharmacist phone 7am- 5pm: [phone] 05/11/2019 10:50 AM

## 2019-05-11 NOTE — Progress Notes (Signed)
PHARMACY - PHYSICIAN COMMUNICATION CRITICAL VALUE ALERT - BLOOD CULTURE IDENTIFICATION (BCID)  Suttyn Winterton is an 44 y.o. female who presented to [facility] on 05-04-2019 with a chief complaint of COVID-19 pneumonia.  She is on day #6 zosyn, day #2 vancomycin, but was transitioned to Cefazolin this morning for MSSA pneumonia.  Assessment:  1/2 bottles (only aerobic bottles collected in each set): GPC Tracheal aspirate culture: MSSA  Name of physician (or Provider) Contacted: Dr. Gaynell Face  Current antibiotics: Cefazolin 2g IV q8h  Changes to prescribed antibiotics recommended: No changes, monitor cultures.  Lynann Beaver PharmD, BCPS Clinical pharmacist phone 7am- 5pm: [phone] 05/11/2019 1:04 PM

## 2019-05-11 NOTE — Progress Notes (Signed)
Patient's head repositioned and arms rotated.  Patient tolerated well.  No skin breakdown noted at this time.    

## 2019-05-11 NOTE — Progress Notes (Signed)
Pt emergently proned at this time d/t desat to 60s.  MD Ogan made verbal order after CXR and ABG performed.  Pt sat was 86% as soon as she flipped. RT will continue to monitor.

## 2019-05-11 NOTE — Progress Notes (Signed)
Nutrition Follow-up RD working remotely.  DOCUMENTATION CODES:   Morbid obesity  INTERVENTION:    Vital 1.5 at 40 ml/h (960 ml per day)   Pro-stat 30 ml 5 times per day   Provides 1940 kcal, 139.8 gm protein, 733 ml free water daily   When IVF discontinued, recommend free water flushes 200 ml every 4 hours.  NUTRITION DIAGNOSIS:   Increased nutrient needs related to catabolic illness(COVID) as evidenced by estimated needs.  GOAL:   Patient will meet greater than or equal to 90% of their needs  MONITOR:   Vent status, TF tolerance, Labs, I & O's  ASSESSMENT:   44 yo female admitted to [facility] 1/19 with hypoxia. COVID positive on 1/12. Required intubation on 1/21. PMH includes asthma, HLD, chronic bronchitis, GERD, dysrhythmia, pre-diabetes.   Proning was deferred 1/26 due to pneumomediastinum, which has improved this morning. Patient was re-proned at 5 am today. She remains intubated on ventilator support. MV: 13.3 L/min Temp (24hrs), Avg:99.6 F (37.6 C), Min:95.4 F (35.2 C), Max:101.5 F (38.6 C)   Currently receiving Vital 1.5 at 40 ml/h via OGT with Pro-stat 60 ml BID providing 1840 kcal, 125 gm protein, 730 ml free water daily. Tolerating well.  Labs reviewed.  CBG's: [id number]  Medications reviewed and include decadron, novolog SSI every 4 hours + 5 units every 4 hours, levemir, miralax, nimbex.   I/O +4.1 L since admission Current weight 119.3 kg (1/26) down from 127 kg on admission.   Diet Order:   Diet Order            Diet NPO time specified  Diet effective now              EDUCATION NEEDS:   Not appropriate for education at this time  Skin:  Skin Assessment: Reviewed RN Assessment  Last BM:  1/26  Height:   Ht Readings from Last 1 Encounters:  05/04/19 5\' 2"  (1.575 m)    Weight:   Wt Readings from Last 1 Encounters:  05/10/19 119.3 kg    Ideal Body Weight:  50 kg  BMI:  Body mass index is 48.1 kg/m.  Estimated  Nutritional Needs:   Kcal:  1940-2400  Protein:  115-125 gm  Fluid:  >/= 1.6 L    05/12/19, RD, LDN, CNSC Pager [phone] After Hours Pager [phone]

## 2019-05-11 NOTE — Progress Notes (Signed)
eLink Physician-Brief Progress Note Patient Name: [name] DOB: 1975/10/07 MRN: [id number]   Date of Service  05/11/2019  HPI/Events of Note  Pt with profound desaturation which is not improving, she's had some pinkish secretions from her ETT  eICU Interventions  Stat portable CXR, Lasix 40 mg iv x 1, Rocuronium 100 mg iv x 1, if CXR unremarkable for pneumothorax the plan is to prone her, I asked Dr. Celine Mans who is at [facility] to see her.        [name] 05/11/2019, 4:33 AM

## 2019-05-11 NOTE — Progress Notes (Signed)
ANTICOAGULATION CONSULT NOTE   Pharmacy Consult for heparin Indication: PE/DVT  Allergies  Allergen Reactions  . Levalbuterol Shortness Of Breath  . Buspar [Buspirone Hcl]      Fainting feelings   . Sulfamethoxazole     REACTION: unspecified  . Sulfonamide Derivatives     REACTION: rash    Patient Measurements: Height: 5\' 2"  (157.5 cm) Weight: 263 lb 0.1 oz (119.3 kg) IBW/kg (Calculated) : 50.1 Heparin Dosing Weight: 81kg  Vital Signs: Temp: 98.6 F (37 C) (01/27 1603) Temp Source: Esophageal (01/27 1439) BP: 137/69 (01/27 1603) Pulse Rate: 80 (01/27 1603)  Labs: Recent Labs    05/09/19 0436 05/09/19 1352 05/10/19 0420 05/10/19 2315 05/11/19 0430 05/11/19 0430 05/11/19 0448 05/11/19 1020 05/11/19 1700  HGB 10.9*   < > 10.9*   < > 11.3*   < > 11.9* 10.5*  --   HCT 36.0   < > 36.0   < > 37.1  --  35.0* 31.0*  --   PLT 137*  --  120*  --  118*  --   --   --   --   HEPARINUNFRC 0.61  --  0.66  --  0.72*  --   --   --  0.47  CREATININE 0.75  --  0.70  --  0.69  --   --   --   --    < > = values in this interval not displayed.    Estimated Creatinine Clearance: 111.4 mL/min (by C-G formula based on SCr of 0.69 mg/dL).   Assessment: Pt was transferred to the ICU 06-May-2022 after decompensation. She is morbidly obese. She coded on 2022-05-06 requiring 50 mg IV tPA for suspected PE and was started on IV heparin. She had a repeat code blue event on 1/23 requiring 100 mg IV tPA.  Dopplers were positive for DVT on 1/23.  Heparin level 0.72, increased to supratherapeutic on heparin at 1500 units/hr. CBC: Hgb stable at 11.3, Plt continue to decrease to 118k No major bleeding or complications reported.  RN reports some bruising and blood tinged sputum.  PM: Heparin level therapeutic (0.42) on 1400 units/hr.  No worsening bruising issues mentioned above per discussion with RN.  Goal of Therapy:  Heparin level 0.3-0.7 units/ml Monitor platelets by anticoagulation protocol: Yes    Plan:  Continue heparin IV infusion at 1400 units/hr Heparin level 6 hours to confirm therapeutic Daily heparin level and CBC  Thanks for allowing pharmacy to be a part of this patient's care.  2/23, PharmD, BCPS Pharmacy: [phone] 05/11/2019 6:11 PM

## 2019-05-11 NOTE — Progress Notes (Signed)
Patient placed in supine position.  ETT secured.  No breakdown noted.  Patient tolerated well with no complications. RN x3 RTx2 at bedside.

## 2019-05-12 ENCOUNTER — Inpatient Hospital Stay (HOSPITAL_COMMUNITY): Payer: Managed Care, Other (non HMO)

## 2019-05-12 LAB — POCT I-STAT 7, (LYTES, BLD GAS, ICA,H+H)
Acid-Base Excess: 15 mmol/L — ABNORMAL HIGH (ref 0.0–2.0)
Bicarbonate: 41.5 mmol/L — ABNORMAL HIGH (ref 20.0–28.0)
Calcium, Ion: 1.19 mmol/L (ref 1.15–1.40)
HCT: 32 % — ABNORMAL LOW (ref 36.0–46.0)
Hemoglobin: 10.9 g/dL — ABNORMAL LOW (ref 12.0–15.0)
O2 Saturation: 94 %
Patient temperature: 37.2
Potassium: 4.9 mmol/L (ref 3.5–5.1)
Sodium: 134 mmol/L — ABNORMAL LOW (ref 135–145)
TCO2: 43 mmol/L — ABNORMAL HIGH (ref 22–32)
pCO2 arterial: 64.5 mmHg — ABNORMAL HIGH (ref 32.0–48.0)
pH, Arterial: 7.417 (ref 7.350–7.450)
pO2, Arterial: 72 mmHg — ABNORMAL LOW (ref 83.0–108.0)

## 2019-05-12 LAB — HEPARIN LEVEL (UNFRACTIONATED)
Heparin Unfractionated: 0.87 IU/mL — ABNORMAL HIGH (ref 0.30–0.70)
Heparin Unfractionated: 0.31 IU/mL (ref 0.30–0.70)
Heparin Unfractionated: <0.1 IU/mL — ABNORMAL LOW (ref 0.30–0.70)

## 2019-05-12 LAB — AMYLASE: Amylase: 70 U/L (ref 28–100)

## 2019-05-12 LAB — COMPREHENSIVE METABOLIC PANEL
ALT: 72 U/L — ABNORMAL HIGH (ref 0–44)
AST: 37 U/L (ref 15–41)
Albumin: 2.9 g/dL — ABNORMAL LOW (ref 3.5–5.0)
Alkaline Phosphatase: 101 U/L (ref 38–126)
Anion gap: 10 (ref 5–15)
BUN: 25 mg/dL — ABNORMAL HIGH (ref 6–20)
CO2: 35 mmol/L — ABNORMAL HIGH (ref 22–32)
Calcium: 8.5 mg/dL — ABNORMAL LOW (ref 8.9–10.3)
Chloride: 93 mmol/L — ABNORMAL LOW (ref 98–111)
Creatinine, Ser: 0.61 mg/dL (ref 0.44–1.00)
GFR calc Af Amer: >60 mL/min (ref >60)
GFR calc non Af Amer: >60 mL/min (ref >60)
Glucose, Bld: 230 mg/dL — ABNORMAL HIGH (ref 70–99)
Potassium: 5.1 mmol/L (ref 3.5–5.1)
Sodium: 138 mmol/L (ref 135–145)
Total Bilirubin: 0.8 mg/dL (ref 0.3–1.2)
Total Protein: 6 g/dL — ABNORMAL LOW (ref 6.5–8.1)

## 2019-05-12 LAB — CBC
HCT: 34 % — ABNORMAL LOW (ref 36.0–46.0)
Hemoglobin: 10.7 g/dL — ABNORMAL LOW (ref 12.0–15.0)
MCH: 27.9 pg (ref 26.0–34.0)
MCHC: 31.5 g/dL (ref 30.0–36.0)
MCV: 88.5 fL (ref 80.0–100.0)
Platelets: 118 10*3/uL — ABNORMAL LOW (ref 150–400)
RBC: 3.84 MIL/uL — ABNORMAL LOW (ref 3.87–5.11)
RDW: 13.5 % (ref 11.5–15.5)
WBC: 7.5 10*3/uL (ref 4.0–10.5)
nRBC: 0.8 % — ABNORMAL HIGH (ref 0.0–0.2)

## 2019-05-12 LAB — GLUCOSE, CAPILLARY
Glucose-Capillary: 158 mg/dL — ABNORMAL HIGH (ref 70–99)
Glucose-Capillary: 220 mg/dL — ABNORMAL HIGH (ref 70–99)
Glucose-Capillary: 245 mg/dL — ABNORMAL HIGH (ref 70–99)
Glucose-Capillary: 265 mg/dL — ABNORMAL HIGH (ref 70–99)
Glucose-Capillary: 272 mg/dL — ABNORMAL HIGH (ref 70–99)
Glucose-Capillary: 318 mg/dL — ABNORMAL HIGH (ref 70–99)

## 2019-05-12 LAB — D-DIMER, QUANTITATIVE: D-Dimer, Quant: 0.98 ug/mL-FEU — ABNORMAL HIGH (ref 0.00–0.50)

## 2019-05-12 LAB — TRIGLYCERIDES: Triglycerides: 1291 mg/dL — ABNORMAL HIGH (ref <150)

## 2019-05-12 LAB — LIPASE, BLOOD: Lipase: 47 U/L (ref 11–51)

## 2019-05-12 MED ORDER — FUROSEMIDE 10 MG/ML IJ SOLN
40.0000 mg | Freq: Once | INTRAMUSCULAR | Status: AC
  Administered 2019-05-12: 40 mg via INTRAVENOUS
  Filled 2019-05-12: qty 4

## 2019-05-12 MED ORDER — OXYCODONE HCL 5 MG PO TABS
10.0000 mg | ORAL_TABLET | Freq: Four times a day (QID) | ORAL | Status: DC
  Administered 2019-05-12 – 2019-05-17 (×18): 10 mg
  Filled 2019-05-12 (×18): qty 2

## 2019-05-12 MED ORDER — INSULIN ASPART 100 UNIT/ML ~~LOC~~ SOLN
7.0000 [IU] | SUBCUTANEOUS | Status: DC
  Administered 2019-05-12 – 2019-05-15 (×18): 7 [IU] via SUBCUTANEOUS

## 2019-05-12 MED ORDER — INSULIN DETEMIR 100 UNIT/ML ~~LOC~~ SOLN
22.0000 [IU] | Freq: Two times a day (BID) | SUBCUTANEOUS | Status: DC
  Administered 2019-05-12 – 2019-05-14 (×5): 22 [IU] via SUBCUTANEOUS
  Filled 2019-05-12 (×6): qty 0.22

## 2019-05-12 MED ORDER — CLONAZEPAM 1 MG PO TABS
2.0000 mg | ORAL_TABLET | Freq: Two times a day (BID) | ORAL | Status: DC
  Administered 2019-05-12 – 2019-05-17 (×9): 2 mg
  Filled 2019-05-12 (×9): qty 2

## 2019-05-12 MED FILL — Fentanyl Citrate Preservative Free (PF) Inj 2500 MCG/50ML: INTRAMUSCULAR | Qty: 50 | Status: AC

## 2019-05-12 MED FILL — Sodium Chloride IV Soln 0.9%: INTRAVENOUS | Qty: 250 | Status: AC

## 2019-05-12 NOTE — Progress Notes (Signed)
Able to update pt's husband via phone.  All questions answered to best of my ability.   Cont with current care at this time.  He understands she is not making progress at this time.

## 2019-05-12 NOTE — Progress Notes (Signed)
NAME:  [name], MRN:  [id number], DOB:  [birth year], LOS: 9 ADMISSION DATE:  04/19/2019, CONSULTATION DATE:  1/21 REFERRING MD:  Jarvis Newcomer, CHIEF COMPLAINT:  dyspnea   Brief History   44 yo female found to be positive for COVID 19 on 04/26/19 developed progressive dyspnea associated with lethargy.  In ER found to have hypoxia.  Admitted to [facility] on 04/26/2019.  Started on decadron and remdesivir.  Treated with tocilizumab and convalescent plasma.  Developed worsening hypoxia and mental status, and required intubation 05/05/19.  Past Medical History  Pre-DM, HTN, Asthma, Obesity  Significant Hospital Events   1/19 Admit 1/20 Transfer to ICU 1/21 VDRF, ARDS protocol-->emergently intubated. Had 6 minute cardiac arrest w/ prolonged time w/ pulse ox in 30s. Given IV TPA w/ concern for PE 1/22 fully awake. Initially asking nursing staff about stopping. After discussing plan of care pt agreeable to aggressive care as long as no evidence of organ failure and on-going evidence of improvement. Starting heavy sedation goal and prone protocol. Checking echo and LE dopplers. Changing propofol to versed and fent at TG increased.  Later in the day developed acute hypoxemia again, repeat bedside echo showed worsening RV dysfunction, given TPA again.  1/23 worsening oxygenation  Consults:  PCCM  Procedures:  ETT 1/21 >>  L IJ CVL 1/22 >>  Significant Diagnostic Tests:  ECHO 1/22: LVEF 60-65%, otherwise normal.  LE dopplers 1/23> positive L popliteal thrombus  Micro Data:  SARS CoV 2 NAA 1/12 >> detected RVP 1/19 >> negative Resp 1/22: GPC Blood 1/19: neg resp 1/25: MSSA Blood 1/26: 1 of 2 gpc  Antimicrobials/COVID Rx:  Remdesivir 1/19 >>1/24 Decadron 1/19 >> Tocilizumab 1/19 Convalescent plasma 1/20   Zosyn 1/22 > 1/24, restarted 1/25-> 1/27 vanc 1/26->1/27 Ancef 1/27->  Interim history/subjective:  1/28: cont to remain on proning protocol. No acute events overnight. tmax noted to be 101.5  again, cooling blanket in place 1/27: overnight with desaturations. Started on continuous nimbex with better vent synchrony and oxygenation. Able to wean peep from 12-14 as well. Tmax101.5 yesterday.  1/26: proning deferred 2/2 pneumomediastinum. tmax to 103.2, cooling blanket and fever now improved off cooling blanket. Remains on 70%/10 (down from 80/16). Trying to keep pressures/peep low 2/2 pneumomedistinum (which is stable to slightly improved today) 1/25: tmax 102.9 overnight. New pneumomediastinum noted on cxr this am.  1/24: oxygenation better, low grade fever  Objective   Blood pressure 116/62, pulse 89, temperature (!) 100.8 F (38.2 C), temperature source Esophageal, resp. rate (!) 27, height 5\' 2"  (1.575 m), weight 119.3 kg, SpO2 92 %.    Vent Mode: PRVC FiO2 (%):  [90 %-100 %] 100 % Set Rate:  [30 bmp] 30 bmp Vt Set:  [440 mL] 440 mL PEEP:  [12 cmH20] 12 cmH20 Plateau Pressure:  [31 cmH20-34 cmH20] 31 cmH20   Intake/Output Summary (Last 24 hours) at 05/12/2019 0845 Last data filed at 05/12/2019 0800 Gross per 24 hour  Intake 2663.07 ml  Output 4675 ml  Net -2011.93 ml   Filed Weights   05/04/19 0538 05/07/19 0500 05/10/19 0500  Weight: 123.8 kg 126.3 kg 119.3 kg    Examination:  General:  In bed on vent, prone HENT: NCAT ETT in place bluish discoloration resolved PULM: b/l wheeze and rhonchi, vent supported breathing CV: RRR, no mgr GI: BS+, soft, nontender MSK: normal bulk and tone Neuro: sedated on vent   Resolved Hospital Problem list     Assessment & Plan:  ARDS due to  COVID 19 pneumonia: significantly improved oxygenation Bacterial pneumonia? Unclear if she actually has this  Fever due to COVID Pneumomediastinum Continue mechanical ventilation per ARDS protocol Target TVol 6-8cc/kgIBW Target Plateau Pressure < 30cm H20 Target driving pressure less than 15 cm of water Target PaO2 55-65: titrate PEEP/FiO2 per protocol As long as PaO2 to FiO2 ratio is  less than 1:150 position in prone position for 16 hours a day(held 2/2 pneumomediastinum however we can continue should she warrant it today. ) Check CVP daily if CVL in place Target CVP less than 4, diurese as necessary, will redose lasix today Ventilator associated pneumonia prevention protocol -cont with proning protocol -pneumomediastinum stable   -started continuous paralysis 1/27 will cont today. Unable to wean vent settings thus far.  -considerable d/w husband re: ecmo therapy. He endorses pt did not want intubation and that going down that road would not be consistent with her wishes. Wants to cont as we are for now.  Need for sedation for mechanical vent Severe ventilator dyssynchrony PAD protocol RASS target -2 Continue fentanyl/versed  S/P Cardiac arrest on 1/21, likely acute PE, received TPA Bedside echo on 1/22 with severe hypoxemia showed WORSENING RV failure compared to study earlier in day, likely second PE, received TPA again LE doppler positive for DVT on 1/23 Continue heparin infusion -ddimer is down today.   dm2 with Hyperglycemia -SSI with tube feed coverage -a1c 6.8  Transaminitis:  -improving trend  Hypertriglyceridemia:  -off propofol -will check lipase/amylase to ensure no pancreatitis in setting inabilty to check for symptoms. And known fever without + cultures   Thrombocytopenia:  -noted -follow closely  Best practice:  Diet: tube feeding Pain/Anxiety/Delirium protocol (if indicated): RASS target -4 to -5 VAP protocol (if indicated): yes DVT prophylaxis: on heparin GI prophylaxis: famotidine Glucose control: SSI Mobility: bed rest Code Status: DNR if arrests Family Communication: attempted to update husband via phone but went to voicemail (1/28) will retry later today.  Disposition: remain in ICU  Labs   CBC: Recent Labs  Lab 05/06/19 0640 05/07/19 0455 05/08/19 0505 05/08/19 0981 05/09/19 0436 05/09/19 1352 05/10/19 0420  05/10/19 2315 05/11/19 0430 05/11/19 0448 05/11/19 1020 05/11/19 2205 05/12/19 0437  WBC 11.8*   < > 10.6*  --  10.7*  --  11.0*  --  11.6*  --   --   --  7.5  NEUTROABS 9.1*  --   --   --  8.1*  --   --   --   --   --   --   --   --   HGB 11.8*   < > 11.2*   < > 10.9*   < > 10.9*   < > 11.3* 11.9* 10.5* 10.2* 10.7*  HCT 37.3   < > 35.7*   < > 36.0   < > 36.0   < > 37.1 35.0* 31.0* 30.0* 34.0*  MCV 85.4   < > 88.4  --  90.2  --  90.9  --  90.3  --   --   --  88.5  PLT 179   < > 131*  --  137*  --  120*  --  118*  --   --   --  118*   < > = values in this interval not displayed.    Basic Metabolic Panel: Recent Labs  Lab 05/05/19 1732 05/05/19 2052 05/06/19 1914 05/06/19 7829 05/06/19 1837 05/07/19 0455 05/07/19 0455 05/08/19 0505 05/08/19 5621 05/09/19 0436 05/09/19 1352  05/10/19 0420 05/10/19 2315 05/11/19 0430 05/11/19 0448 05/11/19 1020 05/11/19 2205 05/12/19 0437  NA   < >  --  141   < >  --  140   < > 143   < > 144   < > 143   < > 142 140 138 134* 138  K   < >  --  3.7   < >  --  4.2   < > 4.1   < > 3.8   < > 4.0   < > 4.4 4.6 4.4 5.0 5.1  CL   < >  --  102   < >  --  101   < > 100  --  99  --  103  --  99  --   --   --  93*  CO2  --   --  26   < >  --  30   < > 33*  --  33*  --  31  --  27  --   --   --  35*  GLUCOSE   < >  --  160*   < >  --  200*   < > 237*  --  239*  --  208*  --  229*  --   --   --  230*  BUN   < >  --  18   < >  --  22*   < > 22*  --  27*  --  24*  --  23*  --   --   --  25*  CREATININE   < >  --  0.67   < >  --  0.66   < > 0.76  --  0.75  --  0.70  --  0.69  --   --   --  0.61  CALCIUM  --   --  7.9*   < >  --  7.5*   < > 7.6*  --  7.8*  --  7.6*  --  8.1*  --   --   --  8.5*  MG  --  2.3 2.1  --  2.6* 2.6*  --   --   --   --   --  2.6*  --   --   --   --   --   --   PHOS  --  5.3* 3.2  --  4.9* 3.6  --   --   --   --   --   --   --   --   --   --   --   --    < > = values in this interval not displayed.   GFR: Estimated Creatinine  Clearance: 111.4 mL/min (by C-G formula based on SCr of 0.61 mg/dL). Recent Labs  Lab 05/09/19 0436 05/10/19 0420 05/11/19 0430 05/12/19 0437  PROCALCITON <0.10 <0.10 0.16  --   WBC 10.7* 11.0* 11.6* 7.5    Liver Function Tests: Recent Labs  Lab 05/08/19 0505 05/09/19 0436 05/10/19 0420 05/11/19 0430 05/12/19 0437  AST 107* 68* 57* 52* 37  ALT 112* 94* 77* 80* 72*  ALKPHOS 119 103 86 111 101  BILITOT 0.7 0.7 0.5 0.7 0.8  PROT 5.8* 6.0* 5.5* 5.8* 6.0*  ALBUMIN 2.7* 2.9* 2.7* 2.8* 2.9*   No results for input(s): LIPASE, AMYLASE in the last 168 hours. No results for input(s): AMMONIA in the last 168 hours.  ABG    Component Value Date/Time   PHART 7.423 05/11/2019 2205   PCO2ART 62.8 (H) 05/11/2019 2205   PO2ART 73.0 (L) 05/11/2019 2205   HCO3 40.7 (H) 05/11/2019 2205   TCO2 43 (H) 05/11/2019 2205   O2SAT 94.0 05/11/2019 2205     Coagulation Profile: Recent Labs  Lab 05/06/19 1837  INR 1.7*    Cardiac Enzymes: No results for input(s): CKTOTAL, CKMB, CKMBINDEX, TROPONINI in the last 168 hours.  HbA1C: Hgb A1c MFr Bld  Date/Time Value Ref Range Status  04/24/2019 09:10 PM 6.8 (H) 4.8 - 5.6 % Final    Comment:    (NOTE) Pre diabetes:          5.7%-6.4% Diabetes:              >6.4% Glycemic control for   <7.0% adults with diabetes   01/28/2019 08:32 AM 6.9 (H) 4.6 - 6.5 % Final    Comment:    Glycemic Control Guidelines for People with Diabetes:Non Diabetic:  <6%Goal of Therapy: <7%Additional Action Suggested:  >8%     CBG: Recent Labs  Lab 05/11/19 1638 05/11/19 2005 05/11/19 2355 05/12/19 0421 05/12/19 0734  GLUCAP 298* 191* 220* 245* 158*       Critical care time: The patient is critically ill with multiple organ systems failure and requires high complexity decision making for assessment and support, frequent evaluation and titration of therapies, application of advanced monitoring technologies and extensive interpretation of multiple  databases.  Critical care time 43 mins. This represents my time independent of the NPs time taking care of the pt. This is excluding procedures.    Briant Sites DO [facility] 05/12/2019, 8:45 AM

## 2019-05-12 NOTE — Progress Notes (Signed)
ANTICOAGULATION CONSULT NOTE   Pharmacy Consult for heparin Indication: PE/DVT  Allergies  Allergen Reactions  . Levalbuterol Shortness Of Breath  . Buspar [Buspirone Hcl]      Fainting feelings   . Sulfamethoxazole     REACTION: unspecified  . Sulfonamide Derivatives     REACTION: rash    Patient Measurements: Height: 5\' 2"  (157.5 cm) Weight: 263 lb 0.1 oz (119.3 kg) IBW/kg (Calculated) : 50.1 Heparin Dosing Weight: 81kg  Vital Signs: Temp: 100.8 F (38.2 C) (01/28 1815) Temp Source: Esophageal (01/28 1600) BP: 124/64 (01/28 1815) Pulse Rate: 84 (01/28 1815)  Labs: Recent Labs    05/10/19 0420 05/10/19 2315 05/11/19 0430 05/11/19 0448 05/11/19 1020 05/11/19 1700 05/11/19 2205 05/11/19 2300 05/12/19 0437 05/12/19 1012 05/12/19 1610  HGB 10.9*   < > 11.3*   < > 10.5*  --  10.2*  --  10.7*  --   --   HCT 36.0   < > 37.1   < > 31.0*  --  30.0*  --  34.0*  --   --   PLT 120*  --  118*  --   --   --   --   --  118*  --   --   HEPARINUNFRC 0.66  --  0.72*  --   --    < >  --  0.87*  --  0.31 <0.10*  CREATININE 0.70  --  0.69  --   --   --   --   --  0.61  --   --    < > = values in this interval not displayed.    Estimated Creatinine Clearance: 111.4 mL/min (by C-G formula based on SCr of 0.61 mg/dL).   Assessment: Pt was transferred to the ICU 05-19-2022 after decompensation. She is morbidly obese. She coded on 19-May-2022 requiring 50 mg IV tPA for suspected PE and was started on IV heparin. She had a repeat code blue event on 1/23 requiring 100 mg IV tPA.  Dopplers were positive for DVT on 1/23.  Heparin level undetectable.  Per discussion with RN, peripheral IV site where heparin infusing has infiltrated. She will change heparin to infuse via IJ and heparin levels will now need to be drawn peripherally by phlebotomy.  Goal of Therapy:  Heparin level 0.3-0.7 units/ml Monitor platelets by anticoagulation protocol: Yes   Plan:  Continue heparin IV infusion at 1300  units/hr Recheck heparin level 6 hours  Daily heparin level and CBC  Thanks for allowing pharmacy to be a part of this patient's care.  2/23, PharmD, BCPS Pharmacy: [phone] 05/12/2019 6:45 PM

## 2019-05-12 NOTE — Progress Notes (Signed)
Pt positioned supined w RT at bedside.

## 2019-05-12 NOTE — Progress Notes (Signed)
RN updated [name] on pt's POC; informed that pt remains prone, paralyzed, sedated, and intubated. Spouse is pleased with provided treatment at this time and will call this afternoon.  RN will continue to monitor/assess pt

## 2019-05-12 NOTE — Progress Notes (Signed)
Patient placed in prone position.  ETT secured with cloth tape at 22cm at the lip.  Patient tolerated well with no complications.  

## 2019-05-12 NOTE — Progress Notes (Signed)
ANTICOAGULATION CONSULT NOTE   Pharmacy Consult for heparin Indication: PE/DVT  Allergies  Allergen Reactions  . Levalbuterol Shortness Of Breath  . Buspar [Buspirone Hcl]      Fainting feelings   . Sulfamethoxazole     REACTION: unspecified  . Sulfonamide Derivatives     REACTION: rash    Patient Measurements: Height: 5\' 2"  (157.5 cm) Weight: 263 lb 0.1 oz (119.3 kg) IBW/kg (Calculated) : 50.1 Heparin Dosing Weight: 81kg  Vital Signs: Temp: 101.3 F (38.5 C) (01/28 0500) Temp Source: Esophageal (01/28 0000) BP: 132/63 (01/28 0500) Pulse Rate: 97 (01/28 0500)  Labs: Recent Labs    05/10/19 0420 05/10/19 2315 05/11/19 0430 05/11/19 0448 05/11/19 1020 05/11/19 1020 05/11/19 1700 05/11/19 2205 05/11/19 2300 05/12/19 0437  HGB 10.9*   < > 11.3*   < > 10.5*   < >  --  10.2*  --  10.7*  HCT 36.0   < > 37.1   < > 31.0*  --   --  30.0*  --  34.0*  PLT 120*  --  118*  --   --   --   --   --   --  118*  HEPARINUNFRC 0.66  --  0.72*  --   --   --  0.47  --  0.87*  --   CREATININE 0.70  --  0.69  --   --   --   --   --   --  0.61   < > = values in this interval not displayed.    Estimated Creatinine Clearance: 111.4 mL/min (by C-G formula based on SCr of 0.61 mg/dL).   Assessment: Pt was transferred to the ICU 05-15-2022 after decompensation. She is morbidly obese. She coded on 2022/05/15 requiring 50 mg IV tPA for suspected PE and was started on IV heparin. She had a repeat code blue event on 1/23 requiring 100 mg IV tPA.  Dopplers were positive for DVT on 1/23.  Heparin level 0.31, therapeutic but significantly decreased on heparin at 1300 units/hr. CBC: Hgb decreased to 10.7, Plt low/stable at 118k (baseline Plt 191) No major bleeding or complications reported.  Goal of Therapy:  Heparin level 0.3-0.7 units/ml Monitor platelets by anticoagulation protocol: Yes   Plan:  Continue heparin IV infusion at 1300 units/hr Heparin level 6 hours after starting Daily heparin level  and CBC  Thanks for allowing pharmacy to be a part of this patient's care.  2/23 PharmD, BCPS Clinical pharmacist phone 7am- 5pm: [phone] 05/12/2019 7:02 AM

## 2019-05-12 NOTE — Progress Notes (Signed)
RT NOTE:  Pt turned to supine position. ETT stable throughout.

## 2019-05-12 NOTE — Progress Notes (Signed)
ANTICOAGULATION CONSULT NOTE   Pharmacy Consult for heparin Indication: PE/DVT  Allergies  Allergen Reactions  . Levalbuterol Shortness Of Breath  . Buspar [Buspirone Hcl]      Fainting feelings   . Sulfamethoxazole     REACTION: unspecified  . Sulfonamide Derivatives     REACTION: rash    Patient Measurements: Height: 5\' 2"  (157.5 cm) Weight: 263 lb 0.1 oz (119.3 kg) IBW/kg (Calculated) : 50.1 Heparin Dosing Weight: 81kg  Vital Signs: Temp: 100.6 F (38.1 C) (01/28 0200) Temp Source: Esophageal (01/28 0000) BP: 135/69 (01/28 0200) Pulse Rate: 97 (01/28 0200)  Labs: Recent Labs    05/09/19 0436 05/09/19 1352 05/10/19 0420 05/10/19 2315 05/11/19 0430 05/11/19 0430 05/11/19 0448 05/11/19 0448 05/11/19 1020 05/11/19 1700 05/11/19 2205 05/11/19 2300  HGB 10.9*   < > 10.9*   < > 11.3*   < > 11.9*   < > 10.5*  --  10.2*  --   HCT 36.0   < > 36.0   < > 37.1   < > 35.0*  --  31.0*  --  30.0*  --   PLT 137*  --  120*  --  118*  --   --   --   --   --   --   --   HEPARINUNFRC 0.61  --  0.66  --  0.72*  --   --   --   --  0.47  --  0.87*  CREATININE 0.75  --  0.70  --  0.69  --   --   --   --   --   --   --    < > = values in this interval not displayed.    Estimated Creatinine Clearance: 111.4 mL/min (by C-G formula based on SCr of 0.69 mg/dL).   Assessment: Pt was transferred to the ICU 05-31-22 after decompensation. She is morbidly obese. She coded on 05-31-22 requiring 50 mg IV tPA for suspected PE and was started on IV heparin. She had a repeat code blue event on 1/23 requiring 100 mg IV tPA.  Dopplers were positive for DVT on 1/23.  Heparin level 0.72, increased to supratherapeutic on heparin at 1500 units/hr. CBC: Hgb stable at 11.3, Plt continue to decrease to 118k No major bleeding or complications reported.  RN reports some bruising and blood tinged sputum.  05/12/19 0200 UPDATE Heparin level: 0.87 IU/mL, slightly above therapeutic goal range RN reports  pink-tinged sputum w/oral care and with deep suctioning; MD aware H/H: 10.2/30  Goal of Therapy:  Heparin level 0.3-0.7 units/ml Monitor platelets by anticoagulation protocol: Yes   Plan:  Decrease heparin  infusion rate  to 1300 units/hr Repeat heparin level in ~ 6 hours   Daily heparin level and CBC  Thanks for allowing pharmacy to participate in  this patient's care.  04-02-1976, Pharm. D. Clinical Pharmacist 05/12/2019 2:40 AM

## 2019-05-12 NOTE — Plan of Care (Signed)
  Problem: Education: Goal: Knowledge of risk factors and measures for prevention of condition will improve Outcome: Not Progressing   Problem: Coping: Goal: Psychosocial and spiritual needs will be supported Outcome: Not Progressing   Problem: Respiratory: Goal: Will maintain a patent airway Outcome: Progressing Goal: Complications related to the disease process, condition or treatment will be avoided or minimized Outcome: Progressing   Problem: Education: Goal: Knowledge of General Education information will improve Description: Including pain rating scale, medication(s)/side effects and non-pharmacologic comfort measures Outcome: Not Progressing   Problem: Health Behavior/Discharge Planning: Goal: Ability to manage health-related needs will improve Outcome: Not Progressing   Problem: Clinical Measurements: Goal: Ability to maintain clinical measurements within normal limits will improve Outcome: Not Progressing   Problem: Clinical Measurements: Goal: Ability to maintain clinical measurements within normal limits will improve Outcome: Not Progressing Goal: Will remain free from infection Outcome: Not Progressing Goal: Diagnostic test results will improve Outcome: Not Progressing Goal: Respiratory complications will improve Outcome: Not Progressing Goal: Cardiovascular complication will be avoided Outcome: Progressing   Problem: Activity: Goal: Risk for activity intolerance will decrease Outcome: Not Progressing   Problem: Nutrition: Goal: Adequate nutrition will be maintained Outcome: Progressing   Problem: Coping: Goal: Level of anxiety will decrease Outcome: Not Progressing   Problem: Elimination: Goal: Will not experience complications related to bowel motility Outcome: Progressing Goal: Will not experience complications related to urinary retention Outcome: Progressing   Problem: Pain Managment: Goal: General experience of comfort will improve Outcome:  Not Progressing   Problem: Safety: Goal: Ability to remain free from injury will improve Outcome: Progressing   Problem: Skin Integrity: Goal: Risk for impaired skin integrity will decrease Outcome: Progressing

## 2019-05-12 NOTE — Progress Notes (Signed)
Updated Sherrine Maples (spouse) by phone.

## 2019-05-13 ENCOUNTER — Inpatient Hospital Stay (HOSPITAL_COMMUNITY): Payer: Managed Care, Other (non HMO)

## 2019-05-13 LAB — CBC
HCT: 33 % — ABNORMAL LOW (ref 36.0–46.0)
Hemoglobin: 10.3 g/dL — ABNORMAL LOW (ref 12.0–15.0)
MCH: 27.5 pg (ref 26.0–34.0)
MCHC: 31.2 g/dL (ref 30.0–36.0)
MCV: 88 fL (ref 80.0–100.0)
Platelets: 110 10*3/uL — ABNORMAL LOW (ref 150–400)
RBC: 3.75 MIL/uL — ABNORMAL LOW (ref 3.87–5.11)
RDW: 13.5 % (ref 11.5–15.5)
WBC: 9 10*3/uL (ref 4.0–10.5)
nRBC: 0.3 % — ABNORMAL HIGH (ref 0.0–0.2)

## 2019-05-13 LAB — HEPARIN LEVEL (UNFRACTIONATED)
Heparin Unfractionated: 0.32 IU/mL (ref 0.30–0.70)
Heparin Unfractionated: 0.24 IU/mL — ABNORMAL LOW (ref 0.30–0.70)
Heparin Unfractionated: 0.2 IU/mL — ABNORMAL LOW (ref 0.30–0.70)

## 2019-05-13 LAB — COMPREHENSIVE METABOLIC PANEL
ALT: 54 U/L — ABNORMAL HIGH (ref 0–44)
AST: 28 U/L (ref 15–41)
Albumin: 2.9 g/dL — ABNORMAL LOW (ref 3.5–5.0)
Alkaline Phosphatase: 91 U/L (ref 38–126)
Anion gap: 9 (ref 5–15)
BUN: 26 mg/dL — ABNORMAL HIGH (ref 6–20)
CO2: 36 mmol/L — ABNORMAL HIGH (ref 22–32)
Calcium: 8.6 mg/dL — ABNORMAL LOW (ref 8.9–10.3)
Chloride: 89 mmol/L — ABNORMAL LOW (ref 98–111)
Creatinine, Ser: 0.51 mg/dL (ref 0.44–1.00)
GFR calc Af Amer: >60 mL/min (ref >60)
GFR calc non Af Amer: >60 mL/min (ref >60)
Glucose, Bld: 267 mg/dL — ABNORMAL HIGH (ref 70–99)
Potassium: 5 mmol/L (ref 3.5–5.1)
Sodium: 134 mmol/L — ABNORMAL LOW (ref 135–145)
Total Bilirubin: 0.5 mg/dL (ref 0.3–1.2)
Total Protein: 6 g/dL — ABNORMAL LOW (ref 6.5–8.1)

## 2019-05-13 LAB — TRIGLYCERIDES: Triglycerides: 881 mg/dL — ABNORMAL HIGH (ref <150)

## 2019-05-13 LAB — GLUCOSE, CAPILLARY
Glucose-Capillary: 167 mg/dL — ABNORMAL HIGH (ref 70–99)
Glucose-Capillary: 186 mg/dL — ABNORMAL HIGH (ref 70–99)
Glucose-Capillary: 188 mg/dL — ABNORMAL HIGH (ref 70–99)
Glucose-Capillary: 192 mg/dL — ABNORMAL HIGH (ref 70–99)
Glucose-Capillary: 253 mg/dL — ABNORMAL HIGH (ref 70–99)
Glucose-Capillary: 279 mg/dL — ABNORMAL HIGH (ref 70–99)

## 2019-05-13 LAB — POCT I-STAT 7, (LYTES, BLD GAS, ICA,H+H)
Acid-Base Excess: 17 mmol/L — ABNORMAL HIGH (ref 0.0–2.0)
Bicarbonate: 43.8 mmol/L — ABNORMAL HIGH (ref 20.0–28.0)
Calcium, Ion: 1.18 mmol/L (ref 1.15–1.40)
HCT: 32 % — ABNORMAL LOW (ref 36.0–46.0)
Hemoglobin: 10.9 g/dL — ABNORMAL LOW (ref 12.0–15.0)
O2 Saturation: 92 %
Patient temperature: 37.7
Potassium: 4.5 mmol/L (ref 3.5–5.1)
Sodium: 130 mmol/L — ABNORMAL LOW (ref 135–145)
TCO2: 46 mmol/L — ABNORMAL HIGH (ref 22–32)
pCO2 arterial: 62.8 mmHg — ABNORMAL HIGH (ref 32.0–48.0)
pH, Arterial: 7.454 — ABNORMAL HIGH (ref 7.350–7.450)
pO2, Arterial: 67 mmHg — ABNORMAL LOW (ref 83.0–108.0)

## 2019-05-13 LAB — D-DIMER, QUANTITATIVE: D-Dimer, Quant: 0.85 ug/mL-FEU — ABNORMAL HIGH (ref 0.00–0.50)

## 2019-05-13 MED ORDER — FUROSEMIDE 10 MG/ML IJ SOLN
40.0000 mg | Freq: Two times a day (BID) | INTRAMUSCULAR | Status: AC
  Administered 2019-05-13 (×2): 40 mg via INTRAVENOUS
  Filled 2019-05-13 (×2): qty 4

## 2019-05-13 MED ORDER — HEPARIN (PORCINE) 25000 UT/250ML-% IV SOLN
1550.0000 [IU]/h | INTRAVENOUS | Status: DC
  Administered 2019-05-14 (×2): 1550 [IU]/h via INTRAVENOUS
  Filled 2019-05-13 (×2): qty 250

## 2019-05-13 MED ORDER — HEPARIN (PORCINE) 25000 UT/250ML-% IV SOLN
1350.0000 [IU]/h | INTRAVENOUS | Status: DC
  Administered 2019-05-13: 1350 [IU]/h via INTRAVENOUS
  Filled 2019-05-13: qty 250

## 2019-05-13 NOTE — Progress Notes (Signed)
ANTICOAGULATION CONSULT NOTE   Pharmacy Consult for heparin Indication: PE/DVT  Allergies  Allergen Reactions  . Levalbuterol Shortness Of Breath  . Buspar [Buspirone Hcl]      Fainting feelings   . Sulfamethoxazole     REACTION: unspecified  . Sulfonamide Derivatives     REACTION: rash    Patient Measurements: Height: 5\' 2"  (157.5 cm) Weight: 263 lb 0.1 oz (119.3 kg) IBW/kg (Calculated) : 50.1 Heparin Dosing Weight: 81kg  Vital Signs: Temp: 99.3 F (37.4 C) (01/29 0330) Temp Source: Esophageal (01/28 1600) BP: 139/74 (01/29 0125) Pulse Rate: 83 (01/29 0330)  Labs: Recent Labs    05/11/19 0430 05/11/19 0448 05/11/19 2300 05/12/19 0437 05/12/19 0437 05/12/19 1012 05/12/19 1610 05/12/19 2222 05/13/19 0130  HGB 11.3*   < >  --  10.7*   < >  --   --  10.9* 10.3*  HCT 37.1   < >  --  34.0*  --   --   --  32.0* 33.0*  PLT 118*  --   --  118*  --   --   --   --  110*  HEPARINUNFRC 0.72*   < >   < >  --   --  0.31 <0.10*  --  0.32  CREATININE 0.69  --   --  0.61  --   --   --   --  0.51   < > = values in this interval not displayed.    Estimated Creatinine Clearance: 111.4 mL/min (by C-G formula based on SCr of 0.51 mg/dL).   Assessment: Pt was transferred to the ICU May 22, 2022 after decompensation. She is morbidly obese. She coded on 22-May-2022 requiring 50 mg IV tPA for suspected PE and was started on IV heparin. She had a repeat code blue event on 1/23 requiring 100 mg IV tPA.  Dopplers were positive for DVT on 1/23.    05/13/19 0330   Heparin level: 0.32IU/mL-->just within goal therapeutic range Level drawn peripherally by lab RN reports bruising only from multiple needle sticks H/H: 10.3/33   Plates 03-09-1999   D-Dimer trending down  Goal of Therapy:  Heparin level 0.3-0.7 units/ml Monitor platelets by anticoagulation protocol: Yes   Plan:  Continue heparin  infusion rate  at 1300 units/hr Repeat heparin level in ~ 6 hours  (confirmatory) Daily heparin level and  CBC  Thanks for allowing pharmacy to participate in  this patient's care.  [id number], Pharm. D. Clinical Pharmacist 05/13/2019 3:45 AM

## 2019-05-13 NOTE — Progress Notes (Signed)
ANTICOAGULATION CONSULT NOTE   Pharmacy Consult for heparin IV Indication: PE/DVT  Allergies  Allergen Reactions  . Levalbuterol Shortness Of Breath  . Buspar [Buspirone Hcl]      Fainting feelings   . Sulfamethoxazole     REACTION: unspecified  . Sulfonamide Derivatives     REACTION: rash    Patient Measurements: Height: 5\' 2"  (157.5 cm) Weight: 262 lb 5.6 oz (119 kg) IBW/kg (Calculated) : 50.1 Heparin Dosing Weight: 81kg  Vital Signs: Temp: 101.3 F (38.5 C) (01/29 1800) Temp Source: Esophageal (01/29 1800) BP: 129/70 (01/29 1800) Pulse Rate: 85 (01/29 1800)  Labs: Recent Labs    05/11/19 0430 05/11/19 0448 05/12/19 0437 05/12/19 1012 05/12/19 1610 05/12/19 2222 05/13/19 0130 05/13/19 0801 05/13/19 1735  HGB 11.3*   < > 10.7*  --   --  10.9* 10.3*  --   --   HCT 37.1   < > 34.0*  --   --  32.0* 33.0*  --   --   PLT 118*  --  118*  --   --   --  110*  --   --   HEPARINUNFRC 0.72*   < >  --    < >   < >  --  0.32 0.24* 0.20*  CREATININE 0.69  --  0.61  --   --   --  0.51  --   --    < > = values in this interval not displayed.    Estimated Creatinine Clearance: 111.2 mL/min (by C-G formula based on SCr of 0.51 mg/dL).   Assessment: 33 yoF admitted on 1/19 and transferred to the ICU 1/20 after decompensation. She is morbidly obese. She coded on 05/15/2022 requiring 50 mg IV tPA for suspected PE and was started on IV heparin. She had a repeat code blue event on 1/23 requiring 100 mg IV tPA.  Dopplers were positive for DVT on 1/23.  Heparin level 0.2, subtherapeutic and decreased despite rate increase to 1350 units/hr.  Bruising reported on wrists, possibly from ABGs, no worsening from previous days.  Goal of Therapy:  Heparin level 0.3-0.7 units/ml Monitor platelets by anticoagulation protocol: Yes   Plan:  Increase to heparin IV infusion at 1500 units/hr Heparin level 6 hours after rate change Daily heparin level and CBC  Thanks for allowing pharmacy to be a  part of this patient's care.  2/23, PharmD, BCPS Pharmacy: [phone] 05/13/2019 7:07 PM

## 2019-05-13 NOTE — Progress Notes (Signed)
Call made to pt's husband. Update given on condition and plan of care. Questions answered.

## 2019-05-13 NOTE — Progress Notes (Signed)
ANTICOAGULATION CONSULT NOTE   Pharmacy Consult for heparin IV Indication: PE/DVT  Allergies  Allergen Reactions  . Levalbuterol Shortness Of Breath  . Buspar [Buspirone Hcl]      Fainting feelings   . Sulfamethoxazole     REACTION: unspecified  . Sulfonamide Derivatives     REACTION: rash    Patient Measurements: Height: 5\' 2"  (157.5 cm) Weight: 262 lb 5.6 oz (119 kg) IBW/kg (Calculated) : 50.1 Heparin Dosing Weight: 81kg  Vital Signs: Temp: 99.9 F (37.7 C) (01/29 0900) Temp Source: Axillary (01/29 0800) BP: 141/70 (01/29 0900) Pulse Rate: 79 (01/29 0900)  Labs: Recent Labs    05/11/19 0430 05/11/19 0448 05/11/19 2300 05/12/19 0437 05/12/19 0437 05/12/19 1012 05/12/19 1610 05/12/19 2222 05/13/19 0130  HGB 11.3*   < >  --  10.7*   < >  --   --  10.9* 10.3*  HCT 37.1   < >  --  34.0*  --   --   --  32.0* 33.0*  PLT 118*  --   --  118*  --   --   --   --  110*  HEPARINUNFRC 0.72*   < >   < >  --   --  0.31 <0.10*  --  0.32  CREATININE 0.69  --   --  0.61  --   --   --   --  0.51   < > = values in this interval not displayed.    Estimated Creatinine Clearance: 111.2 mL/min (by C-G formula based on SCr of 0.51 mg/dL).   Assessment: 46 yoF admitted on 1/19 and transferred to the ICU 1/20 after decompensation. She is morbidly obese. She coded on 05/23/22 requiring 50 mg IV tPA for suspected PE and was started on IV heparin. She had a repeat code blue event on 1/23 requiring 100 mg IV tPA.  Dopplers were positive for DVT on 1/23.  Heparin level 0.24, decreased to sub-therapeutic on heparin at 1300 units/hr. CBC: Hgb decreased to 10.3, Plt decreased to 110k (baseline Plt 191) No major bleeding or complications reported.  RN reports bruising from IV sites.  Goal of Therapy:  Heparin level 0.3-0.7 units/ml Monitor platelets by anticoagulation protocol: Yes   Plan:  Increase to heparin IV infusion at 1350 units/hr Heparin level 6 hours after rate change Daily  heparin level and CBC  Thanks for allowing pharmacy to be a part of this patient's care.  2/23 PharmD, BCPS Clinical pharmacist phone 7am- 5pm: [phone] 05/13/2019 9:44 AM

## 2019-05-13 NOTE — Progress Notes (Signed)
Updated Algernon Huxley (spouse) that we re-proned [name] at [employer]

## 2019-05-13 NOTE — Progress Notes (Signed)
Pt's spouse called for update.  Nurse answered all questions to best of ability.

## 2019-05-13 NOTE — Progress Notes (Signed)
Per pharmacy, no changes to heparin infusion at this time.

## 2019-05-13 NOTE — Progress Notes (Signed)
NAME:  [name], MRN:  [id number], DOB:  03/28/1976, LOS: 10 ADMISSION DATE:  05/13/19, CONSULTATION DATE:  1/21 REFERRING MD:  Bonner Puna, CHIEF COMPLAINT:  dyspnea   Brief History   44 yo female found to be positive for COVID 19 on 04/26/19 developed progressive dyspnea associated with lethargy.  In ER found to have hypoxia.  Admitted to [facility] on 2019-05-13.  Started on decadron and remdesivir.  Treated with tocilizumab and convalescent plasma.  Developed worsening hypoxia and mental status, and required intubation 05/05/19.  Past Medical History  Pre-DM, HTN, Asthma, Obesity  Significant Hospital Events   1/19 Admit 1/20 Transfer to ICU 1/21 VDRF, ARDS protocol-->emergently intubated. Had 6 minute cardiac arrest w/ prolonged time w/ pulse ox in 30s. Given IV TPA w/ concern for PE 1/22 fully awake. Initially asking nursing staff about stopping. After discussing plan of care pt agreeable to aggressive care as long as no evidence of organ failure and on-going evidence of improvement. Starting heavy sedation goal and prone protocol. Checking echo and LE dopplers. Changing propofol to versed and fent at TG increased.  Later in the day developed acute hypoxemia again, repeat bedside echo showed worsening RV dysfunction, given TPA again.  1/23 worsening oxygenation 1/25 pneumomediastinum noted on chest x-ray 1/26 continued fevers 1/27 paralytics added  Consults:  PCCM  Procedures:  ETT 1/21 >>  L IJ CVL 1/22 >>  Significant Diagnostic Tests:  ECHO 1/22: LVEF 60-65%, otherwise normal.  LE dopplers 1/23> positive L popliteal thrombus  Micro Data:  SARS CoV 2 NAA 1/12 >> detected RVP 1/19 >> negative Resp 1/22: GPC Blood 1/19: neg resp 1/25: MSSA Blood 1/26: 1 of 2 gpc  Antimicrobials/COVID Rx:  Remdesivir 1/19 >>1/24 Decadron 1/19 >>1/28 Tocilizumab 1/19 Convalescent plasma 1/20   Zosyn 1/22 > 1/24, restarted 1/25-> 1/27 vanc 1/26->1/27 Ancef 1/27->  Interim history/subjective:   1/29: Midazolam 10, fentanyl 200, cis atracurium FiO2 0.90, PEEP 12 I/O+ 945 cc total Hyperglycemia, CBG 253-279   Objective   Blood pressure 124/63, pulse 89, temperature 100 F (37.8 C), resp. rate (!) 30, height 5\' 2"  (1.575 m), weight 119 kg, SpO2 90 %.    Vent Mode: PRVC FiO2 (%):  [90 %-100 %] 90 % Set Rate:  [30 bmp] 30 bmp Vt Set:  [440 mL] 440 mL PEEP:  [12 cmH20] 12 cmH20 Plateau Pressure:  [29 cmH20-39 cmH20] 39 cmH20   Intake/Output Summary (Last 24 hours) at 05/13/2019 0109 Last data filed at 05/13/2019 0600 Gross per 24 hour  Intake 2617.87 ml  Output 3525 ml  Net -907.13 ml   Filed Weights   05/07/19 0500 05/10/19 0500 05/13/19 0352  Weight: 126.3 kg 119.3 kg 119 kg    Examination:  General: obese, prone HENT: ETT inplace PULM:insp and exp rhonchi and squeaks CV: RRR, no mgr GI: hypoactive BS MSK: normal bulk and tone Neuro: sedated , paralyzed   Resolved Hospital Problem list     Assessment & Plan:  ARDS due to COVID 19 pneumonia, acute respiratory failure Probable MSSA superimposed bacterial pneumonia Fever due to COVID, MSSA pneumonia, Pneumomediastinum 1/25 Mechanical ventilation via ARDS protocol, target PRVC 6 cc/kg Wean PEEP and FiO2 as able, 0.90, PEEP 12.  Do not want to increase PEEP at this time given her pneumomediastinum.  Follow chest x-ray Goal plateau pressure less than 30, driving pressure less than 15 Paralytics for vent synchrony, gas exchange, plan to continue Cycle prone positioning as long as she can tolerate hemodynamically, adequately oxygenate  Deep sedation per PAD protocol, goal RASS -4, currently fentanyl, midazolam Diuresis as blood pressure and renal function can tolerate, goal CVP 5-8.  Lasix given 1/28, plan to repeat 1/29 VAP prevention order set Antibiotics changed to cefazolin 1/27  Fever, severe sepsis COVID-19 pneumonia MSSA pneumonia Strep viridans bacteremia Antibiotics changed to cefazolin 1/27 If she  can stabilize then may require TEE to assess for endocarditis, help determine duration of antibiotic therapy.  Deferred at least for now  Need for sedation for mechanical vent Severe ventilator dyssynchrony Paralytics initiated for vent dyssynchrony Fentanyl, Versed, RASS goal -4 with paralysis   S/P Cardiac arrest on 1/21, likely acute PE, received TPA Bedside echo on 1/22 with severe hypoxemia showed WORSENING RV failure compared to study earlier in day, likely second PE, received TPA again LE doppler positive for DVT on 1/23 Continue heparin infusion  dm2 with Hyperglycemia Sliding-scale insulin per protocol Tube feeding coverage 7 units every 4 hours Levemir 22 units twice daily  Transaminitis:  Improved, following intermittently  Hypertriglyceridemia:  Now off propofol Lipase 47  Thrombocytopenia:  Due to critical illness, continue to follow CBC and for any evidence of bleeding  Best practice:  Diet: tube feeding Pain/Anxiety/Delirium protocol (if indicated): RASS target -4 to -5 VAP protocol (if indicated): yes DVT prophylaxis: on heparin GI prophylaxis: famotidine Glucose control: SSI Mobility: bed rest Code Status: DNR if arrests Family Communication: Note goals of care discussions have been ongoing with patient's husband.  Current therapy is acceptable but escalation, for example to ECMO, not felt to be consistent with the patient's wishes. Updated him on status 1/29.    Disposition: remain in ICU  Labs   CBC: Recent Labs  Lab 05/09/19 0436 05/09/19 1352 05/10/19 0420 05/10/19 2315 05/11/19 0430 05/11/19 0448 05/11/19 1020 05/11/19 2205 05/12/19 0437 05/12/19 2222 05/13/19 0130  WBC 10.7*  --  11.0*  --  11.6*  --   --   --  7.5  --  9.0  NEUTROABS 8.1*  --   --   --   --   --   --   --   --   --   --   HGB 10.9*   < > 10.9*   < > 11.3*   < > 10.5* 10.2* 10.7* 10.9* 10.3*  HCT 36.0   < > 36.0   < > 37.1   < > 31.0* 30.0* 34.0* 32.0* 33.0*  MCV  90.2  --  90.9  --  90.3  --   --   --  88.5  --  88.0  PLT 137*  --  120*  --  118*  --   --   --  118*  --  110*   < > = values in this interval not displayed.    Basic Metabolic Panel: Recent Labs  Lab 05/06/19 1837 05/07/19 0455 05/08/19 0505 05/09/19 0436 05/09/19 1352 05/10/19 0420 05/10/19 2315 05/11/19 0430 05/11/19 0448 05/11/19 1020 05/11/19 2205 05/12/19 0437 05/12/19 2222 05/13/19 0130  NA  --  140   < > 144   < > 143   < > 142   < > 138 134* 138 134* 134*  K  --  4.2   < > 3.8   < > 4.0   < > 4.4   < > 4.4 5.0 5.1 4.9 5.0  CL  --  101   < > 99  --  103  --  99  --   --   --  93*  --  89*  CO2  --  30   < > 33*  --  31  --  27  --   --   --  35*  --  36*  GLUCOSE  --  200*   < > 239*  --  208*  --  229*  --   --   --  230*  --  267*  BUN  --  22*   < > 27*  --  24*  --  23*  --   --   --  25*  --  26*  CREATININE  --  0.66   < > 0.75  --  0.70  --  0.69  --   --   --  0.61  --  0.51  CALCIUM  --  7.5*   < > 7.8*  --  7.6*  --  8.1*  --   --   --  8.5*  --  8.6*  MG 2.6* 2.6*  --   --   --  2.6*  --   --   --   --   --   --   --   --   PHOS 4.9* 3.6  --   --   --   --   --   --   --   --   --   --   --   --    < > = values in this interval not displayed.   GFR: Estimated Creatinine Clearance: 111.2 mL/min (by C-G formula based on SCr of 0.51 mg/dL). Recent Labs  Lab 05/09/19 0436 05/09/19 0436 05/10/19 0420 05/11/19 0430 05/12/19 0437 05/13/19 0130  PROCALCITON <0.10  --  <0.10 0.16  --   --   WBC 10.7*   < > 11.0* 11.6* 7.5 9.0   < > = values in this interval not displayed.    Liver Function Tests: Recent Labs  Lab 05/09/19 0436 05/10/19 0420 05/11/19 0430 05/12/19 0437 05/13/19 0130  AST 68* 57* 52* 37 28  ALT 94* 77* 80* 72* 54*  ALKPHOS 103 86 111 101 91  BILITOT 0.7 0.5 0.7 0.8 0.5  PROT 6.0* 5.5* 5.8* 6.0* 6.0*  ALBUMIN 2.9* 2.7* 2.8* 2.9* 2.9*   Recent Labs  Lab 05/12/19 1106  LIPASE 47  AMYLASE 70   No results for input(s):  AMMONIA in the last 168 hours.  ABG    Component Value Date/Time   PHART 7.417 05/12/2019 2222   PCO2ART 64.5 (H) 05/12/2019 2222   PO2ART 72.0 (L) 05/12/2019 2222   HCO3 41.5 (H) 05/12/2019 2222   TCO2 43 (H) 05/12/2019 2222   O2SAT 94.0 05/12/2019 2222     Coagulation Profile: Recent Labs  Lab 05/06/19 1837  INR 1.7*    Cardiac Enzymes: No results for input(s): CKTOTAL, CKMB, CKMBINDEX, TROPONINI in the last 168 hours.  HbA1C: Hgb A1c MFr Bld  Date/Time Value Ref Range Status  05/15/2019 09:10 PM 6.8 (H) 4.8 - 5.6 % Final    Comment:    (NOTE) Pre diabetes:          5.7%-6.4% Diabetes:              >6.4% Glycemic control for   <7.0% adults with diabetes   01/28/2019 08:32 AM 6.9 (H) 4.6 - 6.5 % Final    Comment:    Glycemic Control Guidelines for People with Diabetes:Non Diabetic:  <6%Goal of Therapy: <7%Additional Action Suggested:  >  8%     CBG: Recent Labs  Lab 05/12/19 1620 05/12/19 1951 05/13/19 0011 05/13/19 0403 05/13/19 0802  GLUCAP 318* 272* 188* 279* 253*       Critical care time: The patient is critically ill with multiple organ systems failure and requires high complexity decision making for assessment and support, frequent evaluation and titration of therapies, application of advanced monitoring technologies and extensive interpretation of multiple databases.  Critical care time  35  mins. This represents my time independent of the NPs time taking care of the pt. This is excluding procedures.     Levy Pupa, MD, PhD 05/13/2019, 8:42 AM [facility] [phone] or if no answer [phone]

## 2019-05-13 NOTE — Progress Notes (Signed)
RT NOTE:  Pt proned at this time. ETT taped center @ 22cm. Foam pads placed on cheeks and upper lip. ETT secured throughout turn. Head turned to the left.

## 2019-05-14 ENCOUNTER — Inpatient Hospital Stay (HOSPITAL_COMMUNITY): Payer: Managed Care, Other (non HMO)

## 2019-05-14 LAB — BASIC METABOLIC PANEL
Anion gap: 13 (ref 5–15)
BUN: 31 mg/dL — ABNORMAL HIGH (ref 6–20)
CO2: 36 mmol/L — ABNORMAL HIGH (ref 22–32)
Calcium: 9.1 mg/dL (ref 8.9–10.3)
Chloride: 87 mmol/L — ABNORMAL LOW (ref 98–111)
Creatinine, Ser: 0.59 mg/dL (ref 0.44–1.00)
GFR calc Af Amer: >60 mL/min (ref >60)
GFR calc non Af Amer: >60 mL/min (ref >60)
Glucose, Bld: 170 mg/dL — ABNORMAL HIGH (ref 70–99)
Potassium: 4.5 mmol/L (ref 3.5–5.1)
Sodium: 136 mmol/L (ref 135–145)

## 2019-05-14 LAB — HEPARIN LEVEL (UNFRACTIONATED)
Heparin Unfractionated: 0.24 IU/mL — ABNORMAL LOW (ref 0.30–0.70)
Heparin Unfractionated: 0.32 IU/mL (ref 0.30–0.70)
Heparin Unfractionated: 0.5 IU/mL (ref 0.30–0.70)

## 2019-05-14 LAB — GLUCOSE, CAPILLARY
Glucose-Capillary: 174 mg/dL — ABNORMAL HIGH (ref 70–99)
Glucose-Capillary: 136 mg/dL — ABNORMAL HIGH (ref 70–99)
Glucose-Capillary: 145 mg/dL — ABNORMAL HIGH (ref 70–99)
Glucose-Capillary: 170 mg/dL — ABNORMAL HIGH (ref 70–99)
Glucose-Capillary: 226 mg/dL — ABNORMAL HIGH (ref 70–99)
Glucose-Capillary: 211 mg/dL — ABNORMAL HIGH (ref 70–99)

## 2019-05-14 LAB — CBC
HCT: 33.9 % — ABNORMAL LOW (ref 36.0–46.0)
Hemoglobin: 10.4 g/dL — ABNORMAL LOW (ref 12.0–15.0)
MCH: 27.3 pg (ref 26.0–34.0)
MCHC: 30.7 g/dL (ref 30.0–36.0)
MCV: 89 fL (ref 80.0–100.0)
Platelets: 117 10*3/uL — ABNORMAL LOW (ref 150–400)
RBC: 3.81 MIL/uL — ABNORMAL LOW (ref 3.87–5.11)
RDW: 14 % (ref 11.5–15.5)
WBC: 8.5 10*3/uL (ref 4.0–10.5)
nRBC: 0.4 % — ABNORMAL HIGH (ref 0.0–0.2)

## 2019-05-14 LAB — TRIGLYCERIDES: Triglycerides: 1535 mg/dL — ABNORMAL HIGH (ref <150)

## 2019-05-14 LAB — AMYLASE: Amylase: 85 U/L (ref 28–100)

## 2019-05-14 LAB — PHOSPHORUS: Phosphorus: 4.6 mg/dL (ref 2.5–4.6)

## 2019-05-14 LAB — MAGNESIUM: Magnesium: 2 mg/dL (ref 1.7–2.4)

## 2019-05-14 LAB — LIPASE, BLOOD: Lipase: 60 U/L — ABNORMAL HIGH (ref 11–51)

## 2019-05-14 MED ORDER — METOPROLOL TARTRATE 5 MG/5ML IV SOLN
5.0000 mg | Freq: Four times a day (QID) | INTRAVENOUS | Status: DC | PRN
  Administered 2019-05-16: 5 mg via INTRAVENOUS
  Filled 2019-05-14: qty 5

## 2019-05-14 MED ORDER — HYDROMORPHONE BOLUS VIA INFUSION
0.5000 mg | INTRAVENOUS | Status: DC | PRN
  Filled 2019-05-14: qty 1

## 2019-05-14 MED ORDER — FUROSEMIDE 10 MG/ML IJ SOLN
40.0000 mg | Freq: Every day | INTRAMUSCULAR | Status: DC
  Administered 2019-05-14 – 2019-05-17 (×4): 40 mg via INTRAVENOUS
  Filled 2019-05-14 (×4): qty 4

## 2019-05-14 MED ORDER — SODIUM CHLORIDE 0.9 % IV SOLN
1.0000 mg/h | INTRAVENOUS | Status: DC
  Administered 2019-05-14: 2 mg/h via INTRAVENOUS
  Administered 2019-05-15 – 2019-05-16 (×4): 4 mg/h via INTRAVENOUS
  Filled 2019-05-14 (×6): qty 5

## 2019-05-14 NOTE — Progress Notes (Signed)
ANTICOAGULATION CONSULT NOTE   Pharmacy Consult for heparin IV Indication: PE/DVT  Allergies  Allergen Reactions  . Levalbuterol Shortness Of Breath  . Buspar [Buspirone Hcl]      Fainting feelings   . Sulfamethoxazole     REACTION: unspecified  . Sulfonamide Derivatives     REACTION: rash    Patient Measurements: Height: 5\' 2"  (157.5 cm) Weight: 262 lb 5.6 oz (119 kg) IBW/kg (Calculated) : 50.1 Heparin Dosing Weight: 81kg  Vital Signs: Temp: 99 F (37.2 C) (01/30 0600) Temp Source: Core (01/30 0745) BP: 112/55 (01/30 0600) Pulse Rate: 91 (01/30 0600)  Labs: Recent Labs    05/12/19 0437 05/12/19 1012 05/13/19 0130 05/13/19 0130 05/13/19 0801 05/13/19 1735 05/13/19 2315 05/14/19 0125  HGB 10.7*   < > 10.3*   < >  --   --  10.9* 10.4*  HCT 34.0*   < > 33.0*  --   --   --  32.0* 33.9*  PLT 118*  --  110*  --   --   --   --  117*  HEPARINUNFRC  --    < > 0.32   < > 0.24* 0.20*  --  0.24*  CREATININE 0.61  --  0.51  --   --   --   --  0.59   < > = values in this interval not displayed.    Estimated Creatinine Clearance: 111.2 mL/min (by C-G formula based on SCr of 0.59 mg/dL).   Assessment: 47 yoF admitted on 1/19 and transferred to the ICU 1/20 after decompensation. She is morbidly obese. She coded on May 30, 2022 requiring 50 mg IV tPA for suspected PE and was started on IV heparin. She had a repeat code blue event on 1/23 requiring 100 mg IV tPA.  Dopplers were positive for DVT on 1/23.  Heparin level 0.32,  therapeutic on heparin at 1550 units/hr. CBC: Hgb low/stable at 10.4, Plt low/stable at 117k (baseline Plt 191) No major bleeding or complications reported.   Goal of Therapy:  Heparin level 0.3-0.7 units/ml Monitor platelets by anticoagulation protocol: Yes   Plan:  Continue heparin IV infusion at 1550 units/hr Heparin level in 6 hours to confirm Daily heparin level and CBC  Thanks for allowing pharmacy to be a part of this patient's care.  2/23 PharmD, BCPS Clinical pharmacist phone 7am- 5pm: [phone] 05/14/2019 8:14 AM

## 2019-05-14 NOTE — Progress Notes (Signed)
Pt placed in the supine position. ET tube position stable throughout turn.

## 2019-05-14 NOTE — Progress Notes (Signed)
ANTICOAGULATION CONSULT NOTE   Pharmacy Consult for heparin IV Indication: PE/DVT  Allergies  Allergen Reactions  . Levalbuterol Shortness Of Breath  . Buspar [Buspirone Hcl]      Fainting feelings   . Sulfamethoxazole     REACTION: unspecified  . Sulfonamide Derivatives     REACTION: rash    Patient Measurements: Height: 5\' 2"  (157.5 cm) Weight: 262 lb 5.6 oz (119 kg) IBW/kg (Calculated) : 50.1 Heparin Dosing Weight: 81kg  Vital Signs: Temp: 98.4 F (36.9 C) (01/30 0200) Temp Source: Esophageal (01/30 0200) BP: 105/58 (01/30 0200) Pulse Rate: 84 (01/30 0200)  Labs: Recent Labs    05/11/19 0430 05/11/19 0448 05/12/19 0437 05/12/19 1012 05/13/19 0130 05/13/19 0130 05/13/19 0801 05/13/19 1735 05/13/19 2315 05/14/19 0125  HGB 11.3*   < > 10.7*   < > 10.3*   < >  --   --  10.9* 10.4*  HCT 37.1   < > 34.0*   < > 33.0*  --   --   --  32.0* 33.9*  PLT 118*   < > 118*  --  110*  --   --   --   --  117*  HEPARINUNFRC 0.72*   < >  --    < > 0.32   < > 0.24* 0.20*  --  0.24*  CREATININE 0.69  --  0.61  --  0.51  --   --   --   --   --    < > = values in this interval not displayed.    Estimated Creatinine Clearance: 111.2 mL/min (by C-G formula based on SCr of 0.51 mg/dL).   Assessment: 3 yoF admitted on 1/19 and transferred to the ICU 1/20 after decompensation. She is morbidly obese. She coded on May 09, 2022 requiring 50 mg IV tPA for suspected PE and was started on IV heparin. She had a repeat code blue event on 1/23 requiring 100 mg IV tPA.  Dopplers were positive for DVT on 1/23.  05/14/19 0230   Heparin level: 0.24IU/mL-->just below goal range despite rate increase to 1500 units/hr  RN reports bruising: one in mid0chest and one on Lt AC area-->Probably d/t proning CBC: Hb down to 10.4 (stable for her) and platelets up to 117K  Goal of Therapy:  Heparin level 0.3-0.7 units/ml Monitor platelets by anticoagulation protocol: Yes   Plan:  Increase  heparin infusion  rate to 1550 units/hr (smaller incremental change) Re-check  HL ~ 6-8 hours after rate change Daily heparin level and CBC Monitor for signs/symptoms of bleeding  Thanks for allowing pharmacy to be a part of this patient's care.  05/16/19, Pharm. D. Clinical Pharmacist 05/14/2019 3:12 AM

## 2019-05-14 NOTE — Progress Notes (Signed)
Pt's husband updated via telephone call at this time.  

## 2019-05-14 NOTE — Progress Notes (Signed)
Pt placed in the supine position. Pt's Sp02 dropped to 77% but quickly increased to 84%. No breakdown noted.

## 2019-05-14 NOTE — Progress Notes (Signed)
ANTICOAGULATION CONSULT NOTE   Pharmacy Consult for heparin IV Indication: PE/DVT  Allergies  Allergen Reactions  . Levalbuterol Shortness Of Breath  . Buspar [Buspirone Hcl]      Fainting feelings   . Sulfamethoxazole     REACTION: unspecified  . Sulfonamide Derivatives     REACTION: rash    Patient Measurements: Height: 5\' 2"  (157.5 cm) Weight: 262 lb 5.6 oz (119 kg) IBW/kg (Calculated) : 50.1 Heparin Dosing Weight: 81kg  Vital Signs: Temp: 99.5 F (37.5 C) (01/30 1215) Temp Source: Axillary (01/30 1115) BP: 165/61 (01/30 1215) Pulse Rate: 114 (01/30 1215)  Labs: Recent Labs    05/12/19 0437 05/12/19 1012 05/13/19 0130 05/13/19 0801 05/13/19 1735 05/13/19 2315 05/14/19 0125 05/14/19 0924 05/14/19 1546  HGB 10.7*   < > 10.3*  --   --  10.9* 10.4*  --   --   HCT 34.0*   < > 33.0*  --   --  32.0* 33.9*  --   --   PLT 118*  --  110*  --   --   --  117*  --   --   HEPARINUNFRC  --    < > 0.32   < >   < >  --  0.24* 0.32 0.50  CREATININE 0.61  --  0.51  --   --   --  0.59  --   --    < > = values in this interval not displayed.    Estimated Creatinine Clearance: 111.2 mL/min (by C-G formula based on SCr of 0.59 mg/dL).   Assessment: 29 yoF admitted on 1/19 and transferred to the ICU 1/20 after decompensation. She is morbidly obese. She coded on May 29, 2022 requiring 50 mg IV tPA for suspected PE and was started on IV heparin. She had a repeat code blue event on 1/23 requiring 100 mg IV tPA.  Dopplers were positive for DVT on 1/23.  Heparin level 0.5, therapeutic on heparin at 1550 units/hr.  No major bleeding or complications reported.   Goal of Therapy:  Heparin level 0.3-0.7 units/ml Monitor platelets by anticoagulation protocol: Yes   Plan:  Continue heparin IV infusion at 1550 units/hr Daily heparin level and CBC  Thanks for allowing pharmacy to be a part of this patient's care.  2/23, PharmD, BCPS Pharmacy: [phone] 05/14/2019 5:06 PM

## 2019-05-14 NOTE — Plan of Care (Signed)
PT proned againt at 0500 05/14/19.  Sats 89 to90% prior to proning.  After proned, Pt's sats 85% then recovered within 15 minutes to 88% or better.  Temp down from 101.6F via oral.  Continued with sedation/paralytic to assist pt to tolerate ventilator and to improve oxygenation.    Problem: Respiratory: Goal: Complications related to the disease process, condition or treatment will be avoided or minimized Outcome: Not Progressing   Problem: Clinical Measurements: Goal: Ability to maintain clinical measurements within normal limits will improve Outcome: Not Progressing Goal: Will remain free from infection Outcome: Not Progressing Goal: Respiratory complications will improve Outcome: Not Progressing   Problem: Activity: Goal: Risk for activity intolerance will decrease Outcome: Not Progressing

## 2019-05-14 NOTE — Progress Notes (Addendum)
NAME:  [name], MRN:  [id number], DOB:  November 30, 1975, LOS: 11 ADMISSION DATE:  June 02, 2019, CONSULTATION DATE:  1/21 REFERRING MD:  Jarvis Newcomer, CHIEF COMPLAINT:  dyspnea   Brief History   44 yo female found to be positive for COVID 19 on 04/26/19 developed progressive dyspnea associated with lethargy.  Admitted to [facility] on 06/02/2019.   Intubated 1/21 for ARDS, brief cardiac arrest due to severe hypoxia, but noted to be neuro intact  Past Medical History  Pre-DM, HTN, Asthma, Obesity  Significant Hospital Events   1/19 Admit 1/20 Transfer to ICU 1/21 VDRF, ARDS protocol-->emergently intubated. Had 6 minute cardiac arrest w/ prolonged time w/ pulse ox in 30s. Given IV TPA w/ concern for PE 1/22 fully awake. Initially asking nursing staff about stopping. After discussing plan of care pt agreeable to aggressive care as long as no evidence of organ failure and on-going evidence of improvement. Starting heavy sedation goal and prone protocol. Checking echo and LE dopplers. Changing propofol to versed and fent at TG increased.  Later in the day developed acute hypoxemia again, repeat bedside echo showed worsening RV dysfunction, given TPA again.  1/23 worsening oxygenation 1/25 pneumomediastinum noted on chest x-ray 1/26 continued fevers 1/27 paralytics added 1/29 90%/+12, paralysed  Consults:  PCCM  Procedures:  ETT 1/21 >>  L IJ CVL 1/22 >>  Significant Diagnostic Tests:  ECHO 1/22: LVEF 60-65%, otherwise normal.  LE dopplers 1/23> positive L popliteal thrombus  Micro Data:  SARS CoV 2 NAA 1/12 >> detected RVP 1/19 >> negative Resp 1/22: nml flora Blood 1/19: neg resp 1/25: MSSA Blood 1/26: strep parasanguinis >>  Antimicrobials/COVID Rx:  Remdesivir 1/19 >>1/24 Decadron 1/19 >>1/28 Tocilizumab 1/19 Convalescent plasma 1/20   Zosyn 1/22 > 1/24, restarted 1/25-> 1/27 vanc 1/26->1/27 Ancef 1/27->  Interim history/subjective:   Critically ill, sedated and paralyzed Prone  position since 5 AM Good urine output with Lasix Afebrile , slight hypertensive    Objective   Blood pressure (!) 165/61, pulse (!) 114, temperature 99.5 F (37.5 C), resp. rate (!) 30, height 5\' 2"  (1.575 m), weight 119 kg, SpO2 93 %.    Vent Mode: PRVC FiO2 (%):  [90 %-100 %] 100 % Set Rate:  [30 bmp] 30 bmp Vt Set:  [350 mL-440 mL] 350 mL PEEP:  [12 cmH20] 12 cmH20 Plateau Pressure:  [30 cmH20-34 cmH20] 31 cmH20   Intake/Output Summary (Last 24 hours) at 05/14/2019 1349 Last data filed at 05/14/2019 1130 Gross per 24 hour  Intake 2087.5 ml  Output 2805 ml  Net -717.5 ml   Filed Weights   05/07/19 0500 05/10/19 0500 05/13/19 0352  Weight: 126.3 kg 119.3 kg 119 kg    Examination:  General: obese, prone , HENT: ETT inplace , mild pallor, no icterus PULM: Coarse bilateral ventilated breath sounds CV: RRR, no mgr GI: hypoactive BS MSK: normal bulk and tone Neuro: sedated , paralyzed , RASS -5  Labs show normal electrolytes, normal renal function, no leukocytosis, stable anemia, increased triglycerides 1500  Chest x-ray 1/30 personally reviewed which shows slight improvement in bilateral diffuse airspace disease , no pneumothorax or pneumomediastinum   Resolved Hospital Problem list   Transaminitis:   Assessment & Plan:  ARDS due to COVID 19 pneumonia Probable MSSA superimposed bacterial pneumonia Pneumomediastinum 1/25 Mechanical ventilation via ARDS protocol, target PRVC 6 cc/kg FiO2 increased to 100%, PEEP remains at 12, will increase slightly to 14 since barotrauma is resolved Goal plateau pressure less than 30, driving pressure  less than 15 Paralytics  to continue Cycle prone positioning x 16 h with supine 8 hours Deep sedation per PAD protocol, goal RASS -4, currently fentanyl, midazolam Diuresis as blood pressure and renal function can tolerate, goal CVP 5-8, Lasix 40 daily VAP prevention order set   Fever, severe sepsis COVID-19 pneumonia MSSA  pneumonia Strep  bacteremia Antibiotics changed to cefazolin 1/27 -complete 10 to 14 days   Need for sedation for mechanical vent Severe ventilator dyssynchrony Paralytics initiated for vent dyssynchrony Continue Versed, change from fentanyl to Dilaudid drip. Add parenteral clonazepam and oxycodone RASS goal -4 with paralysis   S/P Cardiac arrest on 1/21, likely acute PE, received TPA Bedside echo on 1/22 with severe hypoxemia showed WORSENING RV failure compared to study earlier in day, likely second PE, received TPA again LE doppler positive for DVT on 1/23 Continue heparin infusion  dm2 with Hyperglycemia Sliding-scale insulin per protocol Tube feeding coverage 7 units every 4 hours Levemir 22 units twice daily   Hypertriglyceridemia: Rising in spite of being off propofol Lipase level, no clinical evidence of pancreatitis May consider insulin drip if continues to rise   Thrombocytopenia:  Due to critical illness, continue to follow CBC and for any evidence of bleeding  Best practice:  Diet: tube feeding Pain/Anxiety/Delirium protocol (if indicated): RASS target -4 to -5 VAP protocol (if indicated): yes DVT prophylaxis: on heparin GI prophylaxis: famotidine Glucose control: SSI Mobility: bed rest Code Status: DNR if arrests Family Communication: Current therapy is acceptable but escalation, for example to ECMO, not felt to be consistent with the patient's wishes. Updated husband 1/29.    Disposition:  ICU  The patient is critically ill with multiple organ systems failure and requires high complexity decision making for assessment and support, frequent evaluation and titration of therapies, application of advanced monitoring technologies and extensive interpretation of multiple databases. Critical Care Time devoted to patient care services described in this note independent of APP/resident  time is 35 minutes.   Kara Mead MD. Shade Flood. [facility]  If no response to pager , please call [phone]   05/14/2019

## 2019-05-14 NOTE — Progress Notes (Signed)
RT NOTE:  Pt turned to prone position without complications. ETT taped and secured at 22 cm center lip. Foam pads placed on cheeks and upper lip. Head turned to the left.

## 2019-05-15 ENCOUNTER — Inpatient Hospital Stay (HOSPITAL_COMMUNITY): Payer: Managed Care, Other (non HMO)

## 2019-05-15 DIAGNOSIS — A419 Sepsis, unspecified organism: Secondary | ICD-10-CM

## 2019-05-15 DIAGNOSIS — R6520 Severe sepsis without septic shock: Secondary | ICD-10-CM

## 2019-05-15 DIAGNOSIS — E781 Pure hyperglyceridemia: Secondary | ICD-10-CM

## 2019-05-15 LAB — POCT I-STAT 7, (LYTES, BLD GAS, ICA,H+H)
Acid-Base Excess: 17 mmol/L — ABNORMAL HIGH (ref 0.0–2.0)
Bicarbonate: 46.7 mmol/L — ABNORMAL HIGH (ref 20.0–28.0)
Calcium, Ion: 1.24 mmol/L (ref 1.15–1.40)
HCT: 34 % — ABNORMAL LOW (ref 36.0–46.0)
Hemoglobin: 11.6 g/dL — ABNORMAL LOW (ref 12.0–15.0)
O2 Saturation: 79 %
Patient temperature: 98.5
Potassium: 5.2 mmol/L — ABNORMAL HIGH (ref 3.5–5.1)
Sodium: 132 mmol/L — ABNORMAL LOW (ref 135–145)
TCO2: 49 mmol/L — ABNORMAL HIGH (ref 22–32)
pCO2 arterial: 91.5 mmHg (ref 32.0–48.0)
pH, Arterial: 7.316 — ABNORMAL LOW (ref 7.350–7.450)
pO2, Arterial: 51 mmHg — ABNORMAL LOW (ref 83.0–108.0)

## 2019-05-15 LAB — CBC
HCT: 34.6 % — ABNORMAL LOW (ref 36.0–46.0)
Hemoglobin: 10.3 g/dL — ABNORMAL LOW (ref 12.0–15.0)
MCH: 27.4 pg (ref 26.0–34.0)
MCHC: 29.8 g/dL — ABNORMAL LOW (ref 30.0–36.0)
MCV: 92 fL (ref 80.0–100.0)
Platelets: 151 10*3/uL (ref 150–400)
RBC: 3.76 MIL/uL — ABNORMAL LOW (ref 3.87–5.11)
RDW: 14.5 % (ref 11.5–15.5)
WBC: 8.5 10*3/uL (ref 4.0–10.5)
nRBC: 0.6 % — ABNORMAL HIGH (ref 0.0–0.2)

## 2019-05-15 LAB — BASIC METABOLIC PANEL
Anion gap: 10 (ref 5–15)
BUN: 28 mg/dL — ABNORMAL HIGH (ref 6–20)
CO2: 39 mmol/L — ABNORMAL HIGH (ref 22–32)
Calcium: 8.9 mg/dL (ref 8.9–10.3)
Chloride: 84 mmol/L — ABNORMAL LOW (ref 98–111)
Creatinine, Ser: 0.63 mg/dL (ref 0.44–1.00)
GFR calc Af Amer: >60 mL/min (ref >60)
GFR calc non Af Amer: >60 mL/min (ref >60)
Glucose, Bld: 199 mg/dL — ABNORMAL HIGH (ref 70–99)
Potassium: 5.1 mmol/L (ref 3.5–5.1)
Sodium: 133 mmol/L — ABNORMAL LOW (ref 135–145)

## 2019-05-15 LAB — GLUCOSE, CAPILLARY
Glucose-Capillary: 106 mg/dL — ABNORMAL HIGH (ref 70–99)
Glucose-Capillary: 108 mg/dL — ABNORMAL HIGH (ref 70–99)
Glucose-Capillary: 126 mg/dL — ABNORMAL HIGH (ref 70–99)
Glucose-Capillary: 138 mg/dL — ABNORMAL HIGH (ref 70–99)
Glucose-Capillary: 165 mg/dL — ABNORMAL HIGH (ref 70–99)
Glucose-Capillary: 175 mg/dL — ABNORMAL HIGH (ref 70–99)
Glucose-Capillary: 197 mg/dL — ABNORMAL HIGH (ref 70–99)
Glucose-Capillary: 76 mg/dL (ref 70–99)
Glucose-Capillary: 76 mg/dL (ref 70–99)
Glucose-Capillary: 81 mg/dL (ref 70–99)
Glucose-Capillary: 84 mg/dL (ref 70–99)
Glucose-Capillary: 86 mg/dL (ref 70–99)
Glucose-Capillary: 88 mg/dL (ref 70–99)
Glucose-Capillary: 89 mg/dL (ref 70–99)
Glucose-Capillary: 93 mg/dL (ref 70–99)
Glucose-Capillary: 97 mg/dL (ref 70–99)

## 2019-05-15 LAB — CULTURE, BLOOD (ROUTINE X 2)
Culture: NO GROWTH
Special Requests: ADEQUATE

## 2019-05-15 LAB — TRIGLYCERIDES: Triglycerides: 2870 mg/dL — ABNORMAL HIGH (ref <150)

## 2019-05-15 LAB — HEPARIN LEVEL (UNFRACTIONATED)
Heparin Unfractionated: 0.46 IU/mL (ref 0.30–0.70)
Heparin Unfractionated: 0.48 IU/mL (ref 0.30–0.70)

## 2019-05-15 MED ORDER — ENOXAPARIN SODIUM 120 MG/0.8ML ~~LOC~~ SOLN
120.0000 mg | Freq: Two times a day (BID) | SUBCUTANEOUS | Status: DC
  Administered 2019-05-15 – 2019-05-17 (×5): 120 mg via SUBCUTANEOUS
  Filled 2019-05-15 (×6): qty 0.8

## 2019-05-15 MED ORDER — SODIUM CHLORIDE 0.9 % IV SOLN
2.0000 g | INTRAVENOUS | Status: DC
  Administered 2019-05-15 – 2019-05-16 (×2): 2 g via INTRAVENOUS
  Filled 2019-05-15 (×2): qty 20

## 2019-05-15 MED ORDER — INSULIN (MYXREDLIN) INFUSION FOR HYPERTRIGLYCERIDEMIA
0.1000 [IU]/kg/h | INTRAVENOUS | Status: DC
  Administered 2019-05-15 – 2019-05-17 (×7): 0.1 [IU]/kg/h via INTRAVENOUS
  Filled 2019-05-15 (×8): qty 100

## 2019-05-15 MED ORDER — DEXTROSE 5 % IV SOLN: INTRAVENOUS | Status: DC

## 2019-05-15 MED ORDER — DEXTROSE 10 % IV SOLN
INTRAVENOUS | Status: DC
  Administered 2019-05-16: 20 mL/h via INTRAVENOUS

## 2019-05-15 NOTE — Progress Notes (Signed)
Called husband Sherrine Maples at [phone] and left message.  He called back at [phone] for an update on the patient.

## 2019-05-15 NOTE — Progress Notes (Addendum)
NAME:  [name], MRN:  [id number], DOB:  Oct 05, 1975, LOS: 12 ADMISSION DATE:  May 18, 2019, CONSULTATION DATE:  1/21 REFERRING MD:  Jarvis Newcomer, CHIEF COMPLAINT:  dyspnea   Brief History   44 yo female found to be positive for COVID 19 on 04/26/19 developed progressive dyspnea associated with lethargy.  Admitted to [facility] on May 18, 2019.   Intubated 1/21 for ARDS, brief cardiac arrest due to severe hypoxia, but noted to be neuro intact  Past Medical History  Pre-DM, HTN, Asthma, Obesity  Significant Hospital Events   1/19 Admit 1/20 Transfer to ICU 1/21 VDRF, ARDS protocol-->emergently intubated. Had 6 minute cardiac arrest w/ prolonged time w/ pulse ox in 30s. Given IV TPA w/ concern for PE 1/22 fully awake. Initially asking nursing staff about stopping. After discussing plan of care pt agreeable to aggressive care as long as no evidence of organ failure and on-going evidence of improvement. Starting heavy sedation goal and prone protocol. Checking echo and LE dopplers. Changing propofol to versed and fent at TG increased.  Later in the day developed acute hypoxemia again, repeat bedside echo showed worsening RV dysfunction, given TPA again.  1/23 worsening oxygenation 1/25 pneumomediastinum noted on chest x-ray 1/26 continued fevers 1/27 paralytics added 1/29 90%/+12, paralysed  Consults:  PCCM  Procedures:  ETT 1/21 >>  L IJ CVL 1/22 >>  Significant Diagnostic Tests:  ECHO 1/22: LVEF 60-65%, otherwise normal.  LE dopplers 1/23> positive L popliteal thrombus  Micro Data:  SARS CoV 2 NAA 1/12 >> detected RVP 1/19 >> negative Resp 1/22: nml flora Blood 1/19: neg resp 1/25: MSSA Blood 1/26: strep parasanguinis 1/4 >> S to ceftx  Antimicrobials/COVID Rx:  Remdesivir 1/19 >>1/24 Decadron 1/19 >>1/28 Tocilizumab 1/19 Convalescent plasma 1/20   Zosyn 1/22 > 1/24, restarted 1/25-> 1/27 vanc 1/26->1/27 Ancef 1/27-> 1/31 ceftx 1/31 >>  Interim history/subjective:   Remains  critically ill, intubated Sedated and paralyzed 100%/PEEP 14 Febrile 102 Good urine output with Lasix   Objective   Blood pressure 135/60, pulse (!) 104, temperature (!) 102 F (38.9 C), temperature source Esophageal, resp. rate (!) 30, height 5\' 2"  (1.575 m), weight 123.9 kg, SpO2 95 %.    Vent Mode: PRVC FiO2 (%):  [100 %] 100 % Set Rate:  [30 bmp] 30 bmp Vt Set:  [350 mL] 350 mL PEEP:  [12 cmH20-14 cmH20] 14 cmH20 Plateau Pressure:  [30 cmH20-32 cmH20] 31 cmH20   Intake/Output Summary (Last 24 hours) at 05/15/2019 1213 Last data filed at 05/15/2019 1100 Gross per 24 hour  Intake 2095.93 ml  Output 3490 ml  Net -1394.07 ml   Filed Weights   05/10/19 0500 05/13/19 0352 05/15/19 0424  Weight: 119.3 kg 119 kg 123.9 kg    Examination:  General: obese, prone , HENT: ETT inplace , mild pallor, no icterus PULM: Coarse bilateral ventilated breath sounds CV: Unable to evaluate GI: hypoactive BS MSK: normal bulk and tone Neuro: sedated , paralyzed , RASS -5  Labs show mild hyperkalemia, hyponatremia, increased triglycerides 2870, stable anemia. ABG shows respiratory acidosis and hypoxia  Chest x-ray 1/31 personally reviewed, prone position, ET tube not visible, bilateral unchanged interstitial opacities   Resolved Hospital Problem list   Transaminitis:   Assessment & Plan:  ARDS due to COVID 19 pneumonia Probable MSSA superimposed bacterial pneumonia Pneumomediastinum 1/25 -resolved  Mechanical ventilation via ARDS protocol, target PRVC 6-8 cc/kg , currently at 7 cc  FiO2 increased to 100%, PEEP limited to 14 due to barotrauma, Goal plateau  pressure less than 30, driving pressure less than 15 Continue Nimbex drip Cycle prone positioning x 16 h with supine 8 hours Deep sedation per PAD protocol, goal RASS -4 Diuresis as blood pressure and renal function can tolerate, goal CVP 5-8, Lasix 40 daily VAP prevention order set   Fever, severe sepsis COVID-19  pneumonia MSSA pneumonia -completed Ancef Strep  Bacteremia   -Change back to ceftriaxone based on sensitivities of strep although possible this is a contaminant but will treat aggressively since she is having fevers   Need for sedation for mechanical vent Severe ventilator dyssynchrony  Continue Versed + Dilaudid drip. Added parenteral clonazepam and oxycodone RASS goal -4 while on paralytic   S/P Cardiac arrest on 1/21, likely acute PE, received TPA Bedside echo on 1/22 with severe hypoxemia showed WORSENING RV failure compared to study earlier in day, likely second PE, received TPA again LE doppler positive for DVT on 1/23 Continue heparin infusion -okay to switch to Lovenox per pharmacy  dm2 with Hyperglycemia Hypertriglyceridemia: Rising in spite of being off propofol for several days Lipase level nml, no clinical evidence of pancreatitis Start insulin drip 0.1 unit/kg/h -maintain CBG 100-1 80 range , stopped all subcutaneous insulin If tolerated, will increase 2.2 units/kg/h   Thrombocytopenia: Improving  Summary-strep could be a contaminant but treating due to fevers, unclear cause of hypertriglyceridemia, prognosis is that of severe ARDS, high mortality  Current therapy is acceptable but escalation, for example to ECMO, not felt to be consistent with the patient's wishes.  Best practice:  Diet: tube feeding Pain/Anxiety/Delirium protocol (if indicated): RASS target -4 to -5 VAP protocol (if indicated): yes DVT prophylaxis: on heparin GI prophylaxis: famotidine Glucose control: SSI Mobility: bed rest Code Status: DNR if arrests Family Communication: Updated husband 1/31.    Disposition:  ICU   The patient is critically ill with multiple organ systems failure and requires high complexity decision making for assessment and support, frequent evaluation and titration of therapies, application of advanced monitoring technologies and extensive interpretation of  multiple databases. Critical Care Time devoted to patient care services described in this note independent of APP/resident  time is 35 minutes.   Kara Mead MD. Shade Flood. [place] [facility]  If no response to pager , please call [phone]   05/15/2019

## 2019-05-15 NOTE — Plan of Care (Signed)
  Problem: Education: Goal: Knowledge of risk factors and measures for prevention of condition will improve Outcome: Progressing   Problem: Coping: Goal: Psychosocial and spiritual needs will be supported Outcome: Progressing   Problem: Clinical Measurements: Goal: Ability to maintain clinical measurements within normal limits will improve Outcome: Progressing Goal: Cardiovascular complication will be avoided Outcome: Progressing   Problem: Nutrition: Goal: Adequate nutrition will be maintained Outcome: Progressing   Problem: Coping: Goal: Level of anxiety will decrease Outcome: Progressing   Problem: Elimination: Goal: Will not experience complications related to bowel motility Outcome: Progressing Goal: Will not experience complications related to urinary retention Outcome: Progressing   Problem: Pain Managment: Goal: General experience of comfort will improve Outcome: Progressing   Problem: Safety: Goal: Ability to remain free from injury will improve Outcome: Progressing   Problem: Skin Integrity: Goal: Risk for impaired skin integrity will decrease Outcome: Progressing

## 2019-05-15 NOTE — Progress Notes (Signed)
Patient head turned and  ETT secured with cloth tape at 22 cm at the lip.  Patient tolerated well with no complications. 

## 2019-05-15 NOTE — Progress Notes (Signed)
ANTICOAGULATION CONSULT NOTE   Pharmacy Consult for Lovenox Indication: PE/DVT  Allergies  Allergen Reactions  . Levalbuterol Shortness Of Breath  . Buspar [Buspirone Hcl]      Fainting feelings   . Sulfamethoxazole     REACTION: unspecified  . Sulfonamide Derivatives     REACTION: rash    Patient Measurements: Height: 5\' 2"  (157.5 cm) Weight: 273 lb 2.4 oz (123.9 kg) IBW/kg (Calculated) : 50.1 Heparin Dosing Weight: 81kg  Vital Signs: Temp: 102 F (38.9 C) (01/31 0700) Temp Source: Esophageal (01/31 0400) BP: 133/60 (01/31 0700) Pulse Rate: 105 (01/31 0700)  Labs: Recent Labs    05/13/19 0130 05/13/19 0801 05/14/19 0125 05/14/19 0125 05/14/19 0924 05/14/19 1546 05/15/19 0021 05/15/19 0300  HGB 10.3*   < > 10.4*   < >  --   --  11.6* 10.3*  HCT 33.0*   < > 33.9*  --   --   --  34.0* 34.6*  PLT 110*  --  117*  --   --   --   --  151  HEPARINUNFRC 0.32   < > 0.24*   < > 0.32 0.50  --  0.46  CREATININE 0.51  --  0.59  --   --   --   --  0.63   < > = values in this interval not displayed.    Estimated Creatinine Clearance: 113.9 mL/min (by C-G formula based on SCr of 0.63 mg/dL).   Assessment: 64 yoF admitted on 1/19 and transferred to the ICU 1/20 after decompensation. She is morbidly obese. She coded on 05/26/2022 requiring 50 mg IV tPA for suspected PE and was started on IV heparin. She had a repeat code blue event on 1/23 requiring 100 mg IV tPA.  Dopplers were positive for DVT on 1/23.  Pharmacy is now consulted to transition heparin drip to Lovenox.  Heparin level 0.48, remains therapeutic on heparin at 1550 units/hr. CBC: Hgb low/stable at 10.3, Plt improved to 151 (baseline Plt 191) SCr 0.63 No major bleeding or complications reported.   Goal of Therapy:  Heparin level 0.3-0.7 units/ml Monitor platelets by anticoagulation protocol: Yes   Plan:  Stop heparin x1 hour, then start Lovenox Lovenox 1 mg/kg (120 mg) [place] BID Follow up renal function, and CBC at  least q72h  Thanks for allowing pharmacy to be a part of this patient's care.  2/23 PharmD, BCPS Clinical pharmacist phone 7am- 5pm: [phone] 05/15/2019 7:28 AM

## 2019-05-15 NOTE — Progress Notes (Signed)
Patient placed in supine position.  ETT secured.  No breakdown noted.  Patient tolerated well with no complications. 

## 2019-05-15 NOTE — Progress Notes (Addendum)
ANTICOAGULATION CONSULT NOTE   Pharmacy Consult for heparin IV Indication: PE/DVT  Allergies  Allergen Reactions  . Levalbuterol Shortness Of Breath  . Buspar [Buspirone Hcl]      Fainting feelings   . Sulfamethoxazole     REACTION: unspecified  . Sulfonamide Derivatives     REACTION: rash    Patient Measurements: Height: 5\' 2"  (157.5 cm) Weight: 273 lb 2.4 oz (123.9 kg) IBW/kg (Calculated) : 50.1 Heparin Dosing Weight: 81kg  Vital Signs: Temp: 100.8 F (38.2 C) (01/31 0400) Temp Source: Esophageal (01/31 0000) BP: 136/54 (01/31 0400) Pulse Rate: 103 (01/31 0400)  Labs: Recent Labs    05/13/19 0130 05/13/19 0801 05/14/19 0125 05/14/19 0125 05/14/19 0924 05/14/19 1546 05/15/19 0021 05/15/19 0300  HGB 10.3*   < > 10.4*   < >  --   --  11.6* 10.3*  HCT 33.0*   < > 33.9*  --   --   --  34.0* 34.6*  PLT 110*  --  117*  --   --   --   --  151  HEPARINUNFRC 0.32   < > 0.24*   < > 0.32 0.50  --  0.46  CREATININE 0.51  --  0.59  --   --   --   --   --    < > = values in this interval not displayed.    Estimated Creatinine Clearance: 113.9 mL/min (by C-G formula based on SCr of 0.59 mg/dL).   Assessment: 89 yoF admitted on 1/19 and transferred to the ICU 1/20 after decompensation. She is morbidly obese. She coded on 2022-05-31 requiring 50 mg IV tPA for suspected PE and was started on IV heparin. She had a repeat code blue event on 1/23 requiring 100 mg IV tPA.  Dopplers were positive for DVT on 1/23.  05/15/19 0500  Heparin level: 0.46IU/mL-->in therapeutic goal range w/rate increase to 1550 units/hr  RN reports that there are no changes in bruising at this time CBC: Hb 10.3 (stable) and platelets now  151K  Goal of Therapy:  Heparin level 0.3-0.7 units/ml Monitor platelets by anticoagulation protocol: Yes   Plan:  Continue  heparin infusion rate at 1550 units/hr (smaller incremental change)  Daily heparin level and CBC Monitor for signs/symptoms of  bleeding  Thanks for allowing pharmacy to be a part of this patient's care.  05/28/2019, Pharm. D. Clinical Pharmacist 05/15/2019 5:21 AM

## 2019-05-15 NOTE — Progress Notes (Signed)
Called patient's husband Sherrine Maples.  Informed him that the patient was no longer tolerating being on her back so we did prone her again to help improve her oxygenation with good effect so far.  He asked about her fever and treatment of triglycerides and I told him what we are doing at this time to address this issue.

## 2019-05-15 NOTE — Progress Notes (Signed)
Pt placed to prone position without complications.  Foam pads already in place and in good condition.  ETT secured with tape at 22 at the lip. Pt tolerated well.

## 2019-05-15 NOTE — Progress Notes (Signed)
Placed on cooling blanket at this time, motrin and tylenol ineffective

## 2019-05-15 NOTE — Progress Notes (Signed)
Pt being proned at this time d/t desat.  Pt at 71% at this time on 100% and 14+.  Pt ETT taped at 23 @ lip.  No issues to note.

## 2019-05-15 NOTE — Progress Notes (Signed)
flexiseal placed at this time, pt did not tolerate being turned on side well. desat as low as 40%. Sats slowing came back up to 80s when finished and turned back to supine

## 2019-05-16 ENCOUNTER — Inpatient Hospital Stay (HOSPITAL_COMMUNITY): Payer: Managed Care, Other (non HMO)

## 2019-05-16 LAB — TRIGLYCERIDES: Triglycerides: 1837 mg/dL — ABNORMAL HIGH (ref <150)

## 2019-05-16 LAB — BASIC METABOLIC PANEL
Anion gap: 8 (ref 5–15)
BUN: 21 mg/dL — ABNORMAL HIGH (ref 6–20)
CO2: 41 mmol/L — ABNORMAL HIGH (ref 22–32)
Calcium: 8.9 mg/dL (ref 8.9–10.3)
Chloride: 86 mmol/L — ABNORMAL LOW (ref 98–111)
Creatinine, Ser: 0.38 mg/dL — ABNORMAL LOW (ref 0.44–1.00)
GFR calc Af Amer: >60 mL/min (ref >60)
GFR calc non Af Amer: >60 mL/min (ref >60)
Glucose, Bld: 91 mg/dL (ref 70–99)
Potassium: 4.6 mmol/L (ref 3.5–5.1)
Sodium: 135 mmol/L (ref 135–145)

## 2019-05-16 LAB — CBC
HCT: 34.9 % — ABNORMAL LOW (ref 36.0–46.0)
Hemoglobin: 10.3 g/dL — ABNORMAL LOW (ref 12.0–15.0)
MCH: 27.7 pg (ref 26.0–34.0)
MCHC: 29.5 g/dL — ABNORMAL LOW (ref 30.0–36.0)
MCV: 93.8 fL (ref 80.0–100.0)
Platelets: 148 10*3/uL — ABNORMAL LOW (ref 150–400)
RBC: 3.72 MIL/uL — ABNORMAL LOW (ref 3.87–5.11)
RDW: 15.2 % (ref 11.5–15.5)
WBC: 6.9 10*3/uL (ref 4.0–10.5)
nRBC: 1 % — ABNORMAL HIGH (ref 0.0–0.2)

## 2019-05-16 LAB — GLUCOSE, CAPILLARY
Glucose-Capillary: 104 mg/dL — ABNORMAL HIGH (ref 70–99)
Glucose-Capillary: 110 mg/dL — ABNORMAL HIGH (ref 70–99)
Glucose-Capillary: 115 mg/dL — ABNORMAL HIGH (ref 70–99)
Glucose-Capillary: 121 mg/dL — ABNORMAL HIGH (ref 70–99)
Glucose-Capillary: 123 mg/dL — ABNORMAL HIGH (ref 70–99)
Glucose-Capillary: 139 mg/dL — ABNORMAL HIGH (ref 70–99)
Glucose-Capillary: 139 mg/dL — ABNORMAL HIGH (ref 70–99)
Glucose-Capillary: 145 mg/dL — ABNORMAL HIGH (ref 70–99)
Glucose-Capillary: 146 mg/dL — ABNORMAL HIGH (ref 70–99)
Glucose-Capillary: 147 mg/dL — ABNORMAL HIGH (ref 70–99)
Glucose-Capillary: 159 mg/dL — ABNORMAL HIGH (ref 70–99)
Glucose-Capillary: 175 mg/dL — ABNORMAL HIGH (ref 70–99)
Glucose-Capillary: 177 mg/dL — ABNORMAL HIGH (ref 70–99)
Glucose-Capillary: 217 mg/dL — ABNORMAL HIGH (ref 70–99)
Glucose-Capillary: 234 mg/dL — ABNORMAL HIGH (ref 70–99)
Glucose-Capillary: 258 mg/dL — ABNORMAL HIGH (ref 70–99)
Glucose-Capillary: 84 mg/dL (ref 70–99)

## 2019-05-16 LAB — LIPASE, BLOOD: Lipase: 71 U/L — ABNORMAL HIGH (ref 11–51)

## 2019-05-16 LAB — PHOSPHORUS: Phosphorus: 2.7 mg/dL (ref 2.5–4.6)

## 2019-05-16 LAB — MAGNESIUM: Magnesium: 2.3 mg/dL (ref 1.7–2.4)

## 2019-05-16 NOTE — Plan of Care (Signed)
Patient remains paralyzed and sedated.  Was not tolerating supine position at start of shift.  Reproned.  Remains on 100% FIO2 and 14 of peep.  Problem: Education: Goal: Knowledge of risk factors and measures for prevention of condition will improve Outcome: Not Progressing   Problem: Respiratory: Goal: Complications related to the disease process, condition or treatment will be avoided or minimized Outcome: Not Progressing   Problem: Education: Goal: Knowledge of General Education information will improve Description: Including pain rating scale, medication(s)/side effects and non-pharmacologic comfort measures Outcome: Not Progressing   Problem: Health Behavior/Discharge Planning: Goal: Ability to manage health-related needs will improve Outcome: Not Progressing   Problem: Clinical Measurements: Goal: Ability to maintain clinical measurements within normal limits will improve Outcome: Not Progressing Goal: Will remain free from infection Outcome: Not Progressing Goal: Respiratory complications will improve Outcome: Not Progressing   Problem: Activity: Goal: Risk for activity intolerance will decrease Outcome: Not Progressing

## 2019-05-16 NOTE — Progress Notes (Signed)
Pt was turned in supine position, and Desat to 0%. Pt was pronned again and O2 steadily increasing. 80 at this time. MD is aware

## 2019-05-16 NOTE — Progress Notes (Signed)
RN informed Sherrine Maples that pt did not tolerate being unproned.  Pt's O2  sats decreased to 42%, MD aware and pt was reproned.  Sherrine Maples will call Charge Nurse in approximatley 2 hours with POC.  RN will continue to monitor/assess pt.

## 2019-05-16 NOTE — Progress Notes (Signed)
RN updated [name], husband, on pt's progress and POC.  Plan to unprone around 12 noon.  RN will continue to monitor/assess pt

## 2019-05-16 NOTE — Progress Notes (Signed)
At 1230 pt was returned to supine position with RT x2 and RN x4. Upon supine position ETT secure at 23 cm with cloth tape and protective barriers in place on pt face. Minutes later pt SpO2 zero. RT back to room to assess pt. ETT out to 20cm. Cloth tape removed and ETT advanced back to 23cm at the lip. Return tidal volumes on the vent appropriate. ETT resecured with cloth tape and protective barriers to face. Pt SpO2 remained zero. RT x2 and RN x5 emergently reproned pt. SpO2 began to rise. RT notified Dr. Vassie Loll and Merry Proud NP. Unsure if this desaturation event is related to supinating the pt or the ETT being out 3cm. Verbal order received to increase peep to 16. RT will continue to monitor.

## 2019-05-16 NOTE — Progress Notes (Signed)
Spoke with pt's Husband Sherrine Maples c) [phone].  Planning to come tomorrow 05-30-19 for visit with 2 Children.  Also would like to have video chat with pt's parents available. Then would like to talk about making patient comfortable.

## 2019-05-16 NOTE — Plan of Care (Signed)
  Problem: Education: Goal: Knowledge of risk factors and measures for prevention of condition will improve Outcome: Not Progressing   Problem: Respiratory: Goal: Will maintain a patent airway Outcome: Progressing Goal: Complications related to the disease process, condition or treatment will be avoided or minimized Outcome: Progressing   Problem: Education: Goal: Knowledge of General Education information will improve Description: Including pain rating scale, medication(s)/side effects and non-pharmacologic comfort measures Outcome: Not Progressing   Problem: Health Behavior/Discharge Planning: Goal: Ability to manage health-related needs will improve Outcome: Progressing   Problem: Clinical Measurements: Goal: Ability to maintain clinical measurements within normal limits will improve Outcome: Progressing Goal: Will remain free from infection Outcome: Progressing Goal: Diagnostic test results will improve Outcome: Not Progressing Goal: Respiratory complications will improve Outcome: Not Progressing Goal: Cardiovascular complication will be avoided Outcome: Progressing   Problem: Activity: Goal: Risk for activity intolerance will decrease Outcome: Progressing   Problem: Nutrition: Goal: Adequate nutrition will be maintained Outcome: Progressing   Problem: Coping: Goal: Level of anxiety will decrease Outcome: Progressing   Problem: Elimination: Goal: Will not experience complications related to bowel motility Outcome: Progressing Goal: Will not experience complications related to urinary retention Outcome: Progressing   Problem: Pain Managment: Goal: General experience of comfort will improve Outcome: Progressing   Problem: Safety: Goal: Ability to remain free from injury will improve Outcome: Progressing   Problem: Safety: Goal: Ability to remain free from injury will improve Outcome: Progressing   Problem: Skin Integrity: Goal: Risk for impaired skin  integrity will decrease Outcome: Progressing

## 2019-05-16 NOTE — Progress Notes (Signed)
Per CCM MD pt placed supine and spo2 immediately dropped to 42%. Pt re-proned emergently and spo2 improved to 80%. CCM MD at bedside and will call family.

## 2019-05-16 NOTE — Progress Notes (Signed)
RT NOTE:  Head turned to the right without complication. ETT secured throughout.

## 2019-05-16 NOTE — Progress Notes (Signed)
Patient's head repositioned and arms rotated.  Patient tolerated well.  No skin breakdown noted at this time.    

## 2019-05-16 NOTE — Progress Notes (Addendum)
NAME:  [name], MRN:  [id number], DOB:  05-23-1975, LOS: 13 ADMISSION DATE:  05/02/2019, CONSULTATION DATE:  1/21 REFERRING MD:  Jarvis Newcomer, CHIEF COMPLAINT:  dyspnea   Brief History   44 yo female found to be positive for COVID 19 on 04/26/19 developed progressive dyspnea associated with lethargy.  Admitted to [facility] on 05/05/2019.   Intubated 1/21 for ARDS, brief cardiac arrest due to severe hypoxia, but noted to be neuro intact  Past Medical History  Pre-DM, HTN, Asthma, Obesity  Significant Hospital Events   1/19 Admit 1/20 Transfer to ICU 1/21 VDRF, ARDS protocol-->emergently intubated. Had 6 minute cardiac arrest w/ prolonged time w/ pulse ox in 30s. Given IV TPA w/ concern for PE 1/22 fully awake. Initially asking nursing staff about stopping. After discussing plan of care pt agreeable to aggressive care as long as no evidence of organ failure and on-going evidence of improvement. Starting heavy sedation goal and prone protocol. Checking echo and LE dopplers. Changing propofol to versed and fent at TG increased.  Later in the day developed acute hypoxemia again, repeat bedside echo showed worsening RV dysfunction, given TPA again.  1/23 worsening oxygenation 1/25 pneumomediastinum noted on chest x-ray 1/26 continued fevers 1/27 paralytics added 1/29 90%/+12, paralysed 1/31 100%/PEEP 14 ,Febrile 102 , insulin drip started for high triglycerides  Consults:  PCCM  Procedures:  ETT 1/21 >>  L IJ CVL 1/22 >>  Significant Diagnostic Tests:  ECHO 1/22: LVEF 60-65%, otherwise normal.  LE dopplers 1/23> positive L popliteal thrombus  Micro Data:  SARS CoV 2 NAA 1/12 >> detected RVP 1/19 >> negative Resp 1/22: nml flora Blood 1/19: neg resp 1/25: MSSA Blood 1/26: strep parasanguinis 1/4 >> S to ceftx  Antimicrobials/COVID Rx:  Remdesivir 1/19 >>1/24 Decadron 1/19 >>1/28 Tocilizumab 1/19 Convalescent plasma 1/20   Zosyn 1/22 > 1/24, restarted 1/25-> 1/27 vanc 1/26->1/27 Ancef  1/27-> 1/31 ceftx 1/31 >>  Interim history/subjective:   Did not tolerate supine position in prone again on 1/31 within 2 to 3 hours. Critically ill, intubated 100%/PEEP of 14 Good urine output with Lasix Remains on insulin drip Febrile  Objective   Blood pressure (!) 145/68, pulse 96, temperature (!) 100.9 F (38.3 C), temperature source Esophageal, resp. rate (!) 30, height 5\' 2"  (1.575 m), weight 126.9 kg, SpO2 91 %.    Vent Mode: PRVC FiO2 (%):  [100 %] 100 % Set Rate:  [30 bmp] 30 bmp Vt Set:  [350 mL] 350 mL PEEP:  [14 cmH20] 14 cmH20 Plateau Pressure:  [3 cmH20-37 cmH20] 3 cmH20   Intake/Output Summary (Last 24 hours) at 05/16/2019 1209 Last data filed at 05/16/2019 1200 Gross per 24 hour  Intake 3208.42 ml  Output 2225 ml  Net 983.42 ml   Filed Weights   05/13/19 0352 05/15/19 0424 05/16/19 0500  Weight: 119 kg 123.9 kg 126.9 kg    Examination:  General: obese, middle-aged woman, prone , critically ill HENT: ETT inplace , mild pallor, no icterus PULM: Coarse bilateral ventilated breath sounds CV: Unable to evaluate GI: hypoactive BS MSK: normal bulk and tone Neuro: sedated , paralyzed , RASS -5  Labs show normal electrolytes, normal renal function, triglycerides decreased to 1 800, no leukocytosis, stable anemia and thrombocytopenia  Chest x-ray 2/1 personally reviewed, ET tube appears high-due to prone position, persistent bilateral interstitial and alveolar infiltrates   Resolved Hospital Problem list   Transaminitis:   Assessment & Plan:  ARDS due to COVID 19 pneumonia Probable MSSA superimposed  bacterial pneumonia Pneumomediastinum 1/25 -resolved  Mechanical ventilation via ARDS protocol, target PRVC 6-8 cc/kg , currently at 7 cc  FiO2 increased to 100%, PEEP limited to 14 due to barotrauma, Goal plateau pressure less than 30, driving pressure less than 15 Continue Nimbex drip Cycle prone positioning x 16 h with supine 8 hours >> not tolerating  supine at this time x 2 cycles , will increase PEEP to 16 Deep sedation per PAD protocol, goal RASS -4 Diuresis as blood pressure and renal function can tolerate, goal CVP 5-8, Lasix 40 daily VAP prevention order set   Fever, severe sepsis COVID-19 pneumonia MSSA pneumonia -completed Ancef Strep  Bacteremia   -Ct ceftriaxone based on sensitivities of strep- although possible this is a contaminant but will treat aggressively since she is having fevers -Repeat blood cultures for clearing   Need for sedation for mechanical vent Severe ventilator dyssynchrony  Continue Versed + Dilaudid drip. ct parenteral clonazepam and oxycodone RASS goal -5 while on paralytic   S/P Cardiac arrest on 1/21, likely acute PE, received TPA Bedside echo on 1/22 with severe hypoxemia showed WORSENING RV failure compared to study earlier in day, likely second PE, received TPA again LE doppler positive for DVT on 1/23 Continue therapeutic Lovenox per pharmacy  dm2 with Hyperglycemia Hypertriglyceridemia: Rising in spite of being off propofol for several days Lipase level nml, no clinical evidence of pancreatitis Ct  insulin drip 0.1 unit/kg/h -maintain CBG 100-1 80 range with D10 drip Triglycerides trending down, follow daily   Thrombocytopenia: Improving  Summary-persistent fevers-treating for strep bacteremia, refractory hypoxia and not tolerating supine position  Current therapy is acceptable but escalation, for example to ECMO, not felt to be consistent with the patient's wishes. If she deteriorates further, husband would be amenable to withdrawal of life support but feel that given her young age, we should push forward  Best practice:  Diet: tube feeding Pain/Anxiety/Delirium protocol (if indicated): RASS target -4 to -5 VAP protocol (if indicated): yes DVT prophylaxis: on heparin GI prophylaxis: famotidine Glucose control: SSI Mobility: bed rest Code Status: DNR if arrests Family  Communication: Updated husband 2/1   Disposition:  ICU   The patient is critically ill with multiple organ systems failure and requires high complexity decision making for assessment and support, frequent evaluation and titration of therapies, application of advanced monitoring technologies and extensive interpretation of multiple databases. Critical Care Time devoted to patient care services described in this note independent of APP/resident  time is 35 minutes.   Kara Mead MD. [place]. [facility]  If no response to pager , please call [phone]   05/16/2019

## 2019-05-16 DEATH — deceased

## 2019-05-17 ENCOUNTER — Inpatient Hospital Stay (HOSPITAL_COMMUNITY): Payer: Managed Care, Other (non HMO)

## 2019-05-17 LAB — BASIC METABOLIC PANEL
Anion gap: 8 (ref 5–15)
BUN: 17 mg/dL (ref 6–20)
CO2: 39 mmol/L — ABNORMAL HIGH (ref 22–32)
Calcium: 8.8 mg/dL — ABNORMAL LOW (ref 8.9–10.3)
Chloride: 89 mmol/L — ABNORMAL LOW (ref 98–111)
Creatinine, Ser: 0.36 mg/dL — ABNORMAL LOW (ref 0.44–1.00)
GFR calc Af Amer: >60 mL/min (ref >60)
GFR calc non Af Amer: >60 mL/min (ref >60)
Glucose, Bld: 97 mg/dL (ref 70–99)
Potassium: 4.5 mmol/L (ref 3.5–5.1)
Sodium: 136 mmol/L (ref 135–145)

## 2019-05-17 LAB — GLUCOSE, CAPILLARY
Glucose-Capillary: 110 mg/dL — ABNORMAL HIGH (ref 70–99)
Glucose-Capillary: 116 mg/dL — ABNORMAL HIGH (ref 70–99)
Glucose-Capillary: 133 mg/dL — ABNORMAL HIGH (ref 70–99)
Glucose-Capillary: 135 mg/dL — ABNORMAL HIGH (ref 70–99)
Glucose-Capillary: 145 mg/dL — ABNORMAL HIGH (ref 70–99)

## 2019-05-17 LAB — PHOSPHORUS: Phosphorus: 2.4 mg/dL — ABNORMAL LOW (ref 2.5–4.6)

## 2019-05-17 LAB — MAGNESIUM: Magnesium: 1.9 mg/dL (ref 1.7–2.4)

## 2019-05-17 LAB — TRIGLYCERIDES: Triglycerides: 1192 mg/dL — ABNORMAL HIGH (ref <150)

## 2019-05-17 MED ORDER — ACETAMINOPHEN 650 MG RE SUPP: 650.0000 mg | Freq: Four times a day (QID) | RECTAL | Status: DC | PRN

## 2019-05-17 MED ORDER — POLYVINYL ALCOHOL 1.4 % OP SOLN
1.0000 [drp] | Freq: Four times a day (QID) | OPHTHALMIC | Status: DC | PRN
  Filled 2019-05-17: qty 15

## 2019-05-17 MED ORDER — ACETAMINOPHEN 325 MG PO TABS: 650.0000 mg | ORAL_TABLET | Freq: Four times a day (QID) | ORAL | Status: DC | PRN

## 2019-05-17 MED ORDER — DIPHENHYDRAMINE HCL 50 MG/ML IJ SOLN: 25.0000 mg | INTRAMUSCULAR | Status: DC | PRN

## 2019-05-17 MED ORDER — GLYCOPYRROLATE 0.2 MG/ML IJ SOLN: 0.2000 mg | INTRAMUSCULAR | Status: DC | PRN

## 2019-05-17 MED ORDER — GLYCOPYRROLATE 1 MG PO TABS
1.0000 mg | ORAL_TABLET | ORAL | Status: DC | PRN
  Filled 2019-05-17: qty 1

## 2019-05-17 MED ORDER — DEXTROSE 5 % IV SOLN: INTRAVENOUS | Status: DC

## 2019-05-20 MED FILL — Fentanyl Citrate Preservative Free (PF) Inj 2500 MCG/50ML: INTRAMUSCULAR | Qty: 50 | Status: AC

## 2019-05-20 MED FILL — Sodium Chloride IV Soln 0.9%: INTRAVENOUS | Qty: 250 | Status: AC

## 2019-05-30 ENCOUNTER — Other Ambulatory Visit: Payer: Managed Care, Other (non HMO)

## 2019-06-13 NOTE — Progress Notes (Signed)
Pt transported to comfort care room on ventilator with RT and RN. Pt to have scheduled family visit with husband and children. Pt taken down in prone position due to respiratory status and inability to be supine. Pt tolerated well. VSS throughout transport to and from. RT will continue to monitor. Will transition to comfort measures when ready.

## 2019-06-13 NOTE — Progress Notes (Signed)
Pt supined at 1205 and extubated and 1208 to comfort care per physician order/ family request. RTx2 and RN x3 remained at pt bedside.

## 2019-06-13 NOTE — Discharge Summary (Signed)
Physician Death Summary  Patient ID: Ashanti Ratti MRN: [id number] DOB/AGE: 08/24/1975 44 y.o.  Admit date: May 17, 2019 Discharge date: 05/21/2019  Admission Diagnoses: Acute respiratory failure  Discharge Diagnoses:  Principal Problem:   Acute respiratory distress syndrome (ARDS) due to COVID-19 virus [facility]) Active Problems:   Controlled type 2 diabetes mellitus without complication, without long-term current use of insulin (HCC)   Essential hypertension   Acute respiratory failure with hypoxia [facility])   Cardiac arrest  Discharged Condition: Deceased  Hospital Course:  44 year old with prediabetes, hypertension, asthma, obesity admitted on 05/20/2019 for severe COVID-19 pneumonia.  Treated with remdesivir, Decadron, Tocilizumab, convalescent plasma.  She developed severe ARDS, progressive respiratory failure and emergently intubated on 1/21.  Post intubation she had a 6-minute cardiac arrest with w/ prolonged time w/ pulse ox in 30s.Given IV TPA w/ concern for PE.  Patient was neurologically intact and indicated to the team multiple times that she wanted to stop interventions and come off the ventilator. After discussing plan of care patient and family were agreeable to aggressive care as long as no evidence of organ failure and on-going evidence of improvement.  She continued to worsen over the course of hospitalization with worsening hypoxemia, pneumomediastinum, continued fevers.  She was treated with antibiotics for hospital-acquired pneumonia.  Paralyzed on 1/21 and proned.  Her ARDS progressively got worse requiring 16 of PEEP 100% FiO2.  She was continuously proned and did not tolerate supine position with instant desaturations to 40s and 50s on pulse ox.  We had multiple discussions with the husband who made it clear that he or his wife would not want long-term support or ECMO, which was offered.  After the husband and kids had a chance to visit her on 2/2 he requested that she be made  comfortable and extubated.  Orders placed for terminal withdrawal and comfort care.  She passed away immediately after extubation later in the day.  Signed: Chilton Greathouse MD [facility] 05/25/2019, 4:36 PM

## 2019-06-13 NOTE — Progress Notes (Signed)
Wasted 50ml Dilaudid and 30ml Versed into stericycle, witnessed by Haven S. RN 

## 2019-06-13 NOTE — Progress Notes (Signed)
NAME:  [name], MRN:  [id number], DOB:  04-02-1976, LOS: 14 ADMISSION DATE:  05/05/2019, CONSULTATION DATE:  1/21 REFERRING MD:  Jarvis Newcomer, CHIEF COMPLAINT:  dyspnea   Brief History   44 yo female found to be positive for COVID 19 on 04/26/19 developed progressive dyspnea associated with lethargy.  Admitted to [facility] on 05/05/19.   Intubated 1/21 for ARDS, brief cardiac arrest due to severe hypoxia, but noted to be neuro intact  Past Medical History  Pre-DM, HTN, Asthma, Obesity  Significant Hospital Events   1/19 Admit 1/20 Transfer to ICU 1/21 VDRF, ARDS protocol-->emergently intubated. Had 6 minute cardiac arrest w/ prolonged time w/ pulse ox in 30s. Given IV TPA w/ concern for PE 1/22 fully awake. Initially asking nursing staff about stopping. After discussing plan of care pt agreeable to aggressive care as long as no evidence of organ failure and on-going evidence of improvement. Starting heavy sedation goal and prone protocol. Checking echo and LE dopplers. Changing propofol to versed and fent at TG increased.  Later in the day developed acute hypoxemia again, repeat bedside echo showed worsening RV dysfunction, given TPA again.  1/23 worsening oxygenation 1/25 pneumomediastinum noted on chest x-ray 1/26 continued fevers 1/27 paralytics added 1/29 90%/+12, paralysed 1/31 100%/PEEP 14 ,Febrile 102 , insulin drip started for high triglycerides 2/1 Not tolerating supine position  Consults:  PCCM  Procedures:  ETT 1/21 >>  L IJ CVL 1/22 >>  Significant Diagnostic Tests:  ECHO 1/22: LVEF 60-65%, otherwise normal.  LE dopplers 1/23> positive L popliteal thrombus  Micro Data:  SARS CoV 2 NAA 1/12 >> detected RVP 1/19 >> negative Resp 1/22: nml flora Blood 1/19: neg resp 1/25: MSSA Blood 1/26: strep parasanguinis 1/4 >> S to ceftx  Antimicrobials/COVID Rx:  Remdesivir 1/19 >>1/24 Decadron 1/19 >>1/28 Tocilizumab 1/19 Convalescent plasma 1/20   Zosyn 1/22 > 1/24, restarted  1/25-> 1/27 vanc 1/26->1/27 Ancef 1/27-> 1/31 ceftx 1/31 >>  Interim history/subjective:   Remains on high vent settings.  Has not tolerated supine position and had to be proned again immediately yesterday due to desat  Objective   Blood pressure 127/63, pulse 92, temperature 98.8 F (37.1 C), temperature source Bladder, resp. rate (!) 30, height 5\' 2"  (1.575 m), weight 127.1 kg, SpO2 94 %.    Vent Mode: PRVC FiO2 (%):  [100 %] 100 % Set Rate:  [30 bmp] 30 bmp Vt Set:  [350 mL] 350 mL PEEP:  [14 cmH20-16 cmH20] 16 cmH20 Plateau Pressure:  [32 cmH20-35 cmH20] 35 cmH20   Intake/Output Summary (Last 24 hours) at 05/18/2019 07/15/2019 Last data filed at 05/31/2019 0800 Gross per 24 hour  Intake 3144.3 ml  Output 1940 ml  Net 1204.3 ml   Filed Weights   05/15/19 0424 05/16/19 0500 06/02/2019 0500  Weight: 123.9 kg 126.9 kg 127.1 kg    Examination: Gen:      No acute distress, obese HEENT:  EOMI, sclera anicteric Neck:     No masses; no thyromegaly, ETT Lungs:    Clear to auscultation bilaterally; normal respiratory effort CV:         Regular rate and rhythm; no murmurs Abd:      + bowel sounds; soft, non-tender; no palpable masses, no distension Ext:    No edema; adequate peripheral perfusion Skin:      Warm and dry; no rash Neuro: Sedated, paralyzed   Resolved Hospital Problem list   Transaminitis:   Assessment & Plan:  ARDS due to COVID  19 pneumonia Probable MSSA superimposed bacterial pneumonia Pneumomediastinum 1/25 -resolved Continue as needed ventilation, Nimbex drip Not tolerating supine at this time  Fever, severe sepsis COVID-19 pneumonia MSSA pneumonia -completed Ancef Strep  Bacteremia  On ceftriaxone.   Need for sedation for mechanical vent Severe ventilator dyssynchrony Versed, Dilaudid drip.  Unable to wean at this time Clonazepam, oxycodone Paralyzed for vent dyssynchrony   S/P Cardiac arrest on 1/21, likely acute PE, received TPA Bedside echo on 1/22  with severe hypoxemia showed WORSENING RV failure compared to study earlier in day, likely second PE, received TPA again LE doppler positive for DVT on 1/23 On therapeutic Lovenox per pharmacy  dm2 with Hyperglycemia Hypertriglyceridemia: Rising in spite of being off propofol for several days Lipase level nml, no clinical evidence of pancreatitis Insulin drip Triglycerides trending down    Thrombocytopenia: Improving  Goals of care Discussed with husband who is able to visit with his children.  Since patient is on maximal support, unable to lay supine husband is requesting to make her comfortable.  He is clear that his wife would not want to be supported like this, does not want ECMO or other therapies.  Orders placed for withdrawal of care.  Best practice:  Diet: tube feeding Pain/Anxiety/Delirium protocol (if indicated): RASS target -4 to -5 VAP protocol (if indicated): yes DVT prophylaxis: on heparin GI prophylaxis: famotidine Glucose control: SSI Mobility: bed rest Code Status: DNR if arrests Family Communication: Updated husband 2/2.  Transitioning to comfort care.  See above  Disposition:  ICU  The patient is critically ill with multiple organ system failure and requires high complexity decision making for assessment and support, frequent evaluation and titration of therapies, advanced monitoring, review of radiographic studies and interpretation of complex data.   Critical Care Time devoted to patient care services, exclusive of separately billable procedures, described in this note is 45 minutes.   Marshell Garfinkel MD [facility] Please see [email or web address] for pager details.  May 19, 2019, 8:28 AM

## 2019-06-13 NOTE — Progress Notes (Signed)
Patient terminally extubated at 1205 and transitioned to comfort measures.  At 1224 2 nurses, Clovis Riley and [place], listened and felt for any signs of life.  After one minute no pulse was palpated and no heart or lung sounds auscultated.  Patient pronounced dead at 103.  Physician, Dr. Isaiah Serge, notified and [facility] called.  Next of Kin updated via phone call.  Post-mortem care complete.  Patient belongings returned to family prior to extubation.

## 2019-06-13 DEATH — deceased

## 2019-07-14 ENCOUNTER — Other Ambulatory Visit: Payer: Managed Care, Other (non HMO)

## 2019-07-20 ENCOUNTER — Encounter: Payer: Managed Care, Other (non HMO) | Admitting: Primary Care

## 2020-01-26 ENCOUNTER — Other Ambulatory Visit: Payer: Self-pay | Admitting: Primary Care

## 2020-01-26 DIAGNOSIS — E119 Type 2 diabetes mellitus without complications: Secondary | ICD-10-CM

## 2021-03-08 IMAGING — DX DG CHEST 1V PORT
1 series · 1 of 1 positions shown · non-contrast
Comparison: April 08, 2019.

CLINICAL DATA: Chest pain.

EXAM:
PORTABLE CHEST 1 VIEW

[chest ap]
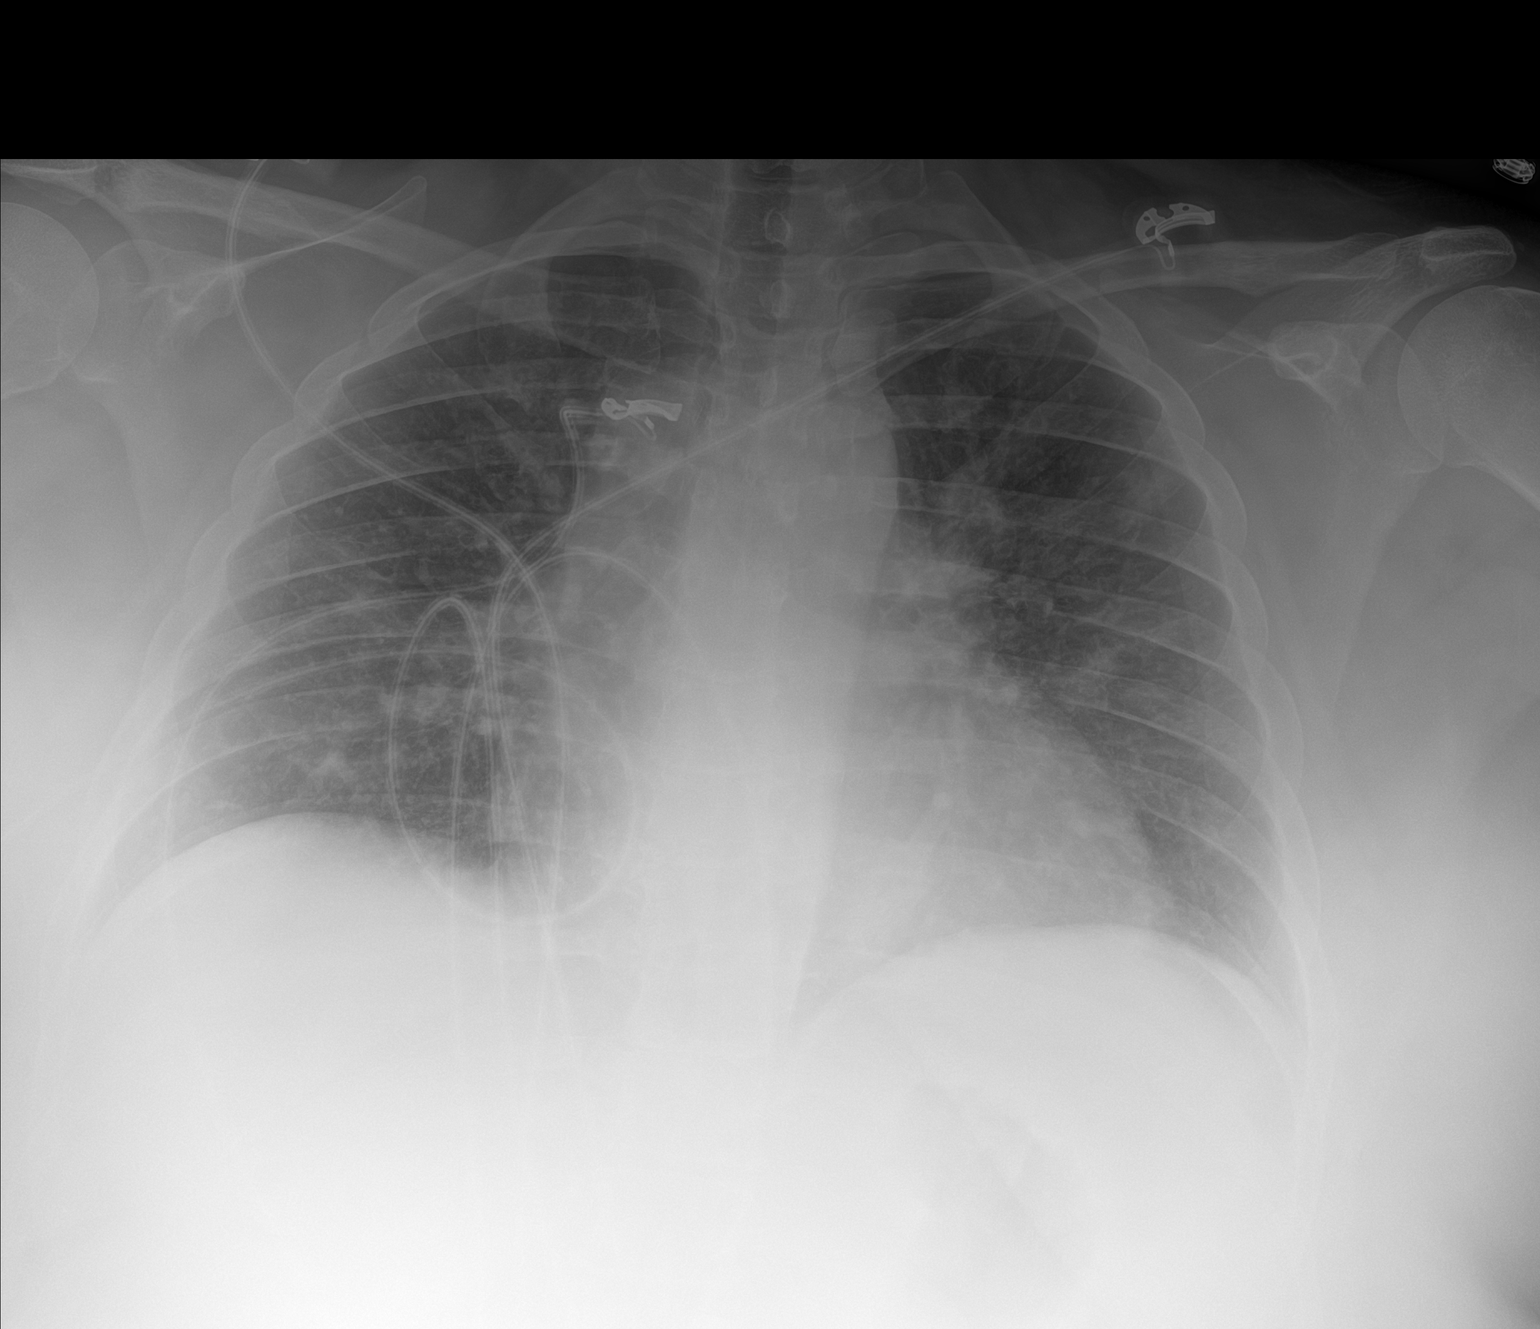

[1 of 1 positions shown; findings below may reference images not displayed]

FINDINGS: The heart size and mediastinal contours are within normal limits. No
pneumothorax or pleural effusion is noted. Minimal ill-defined
patchy opacities are noted throughout both lungs suggesting
multifocal pneumonia. The visualized skeletal structures are
unremarkable.
IMPRESSION: Minimal ill-defined patchy opacities are noted throughout both lungs
suggesting multifocal pneumonia.

## 2021-03-10 IMAGING — DX DG CHEST 1V PORT
1 series · 1 of 1 positions shown · non-contrast
Comparison: May 01, 2019

CLINICAL DATA: Worsening shortness of breath.

EXAM:
PORTABLE CHEST 1 VIEW

[chest ap]
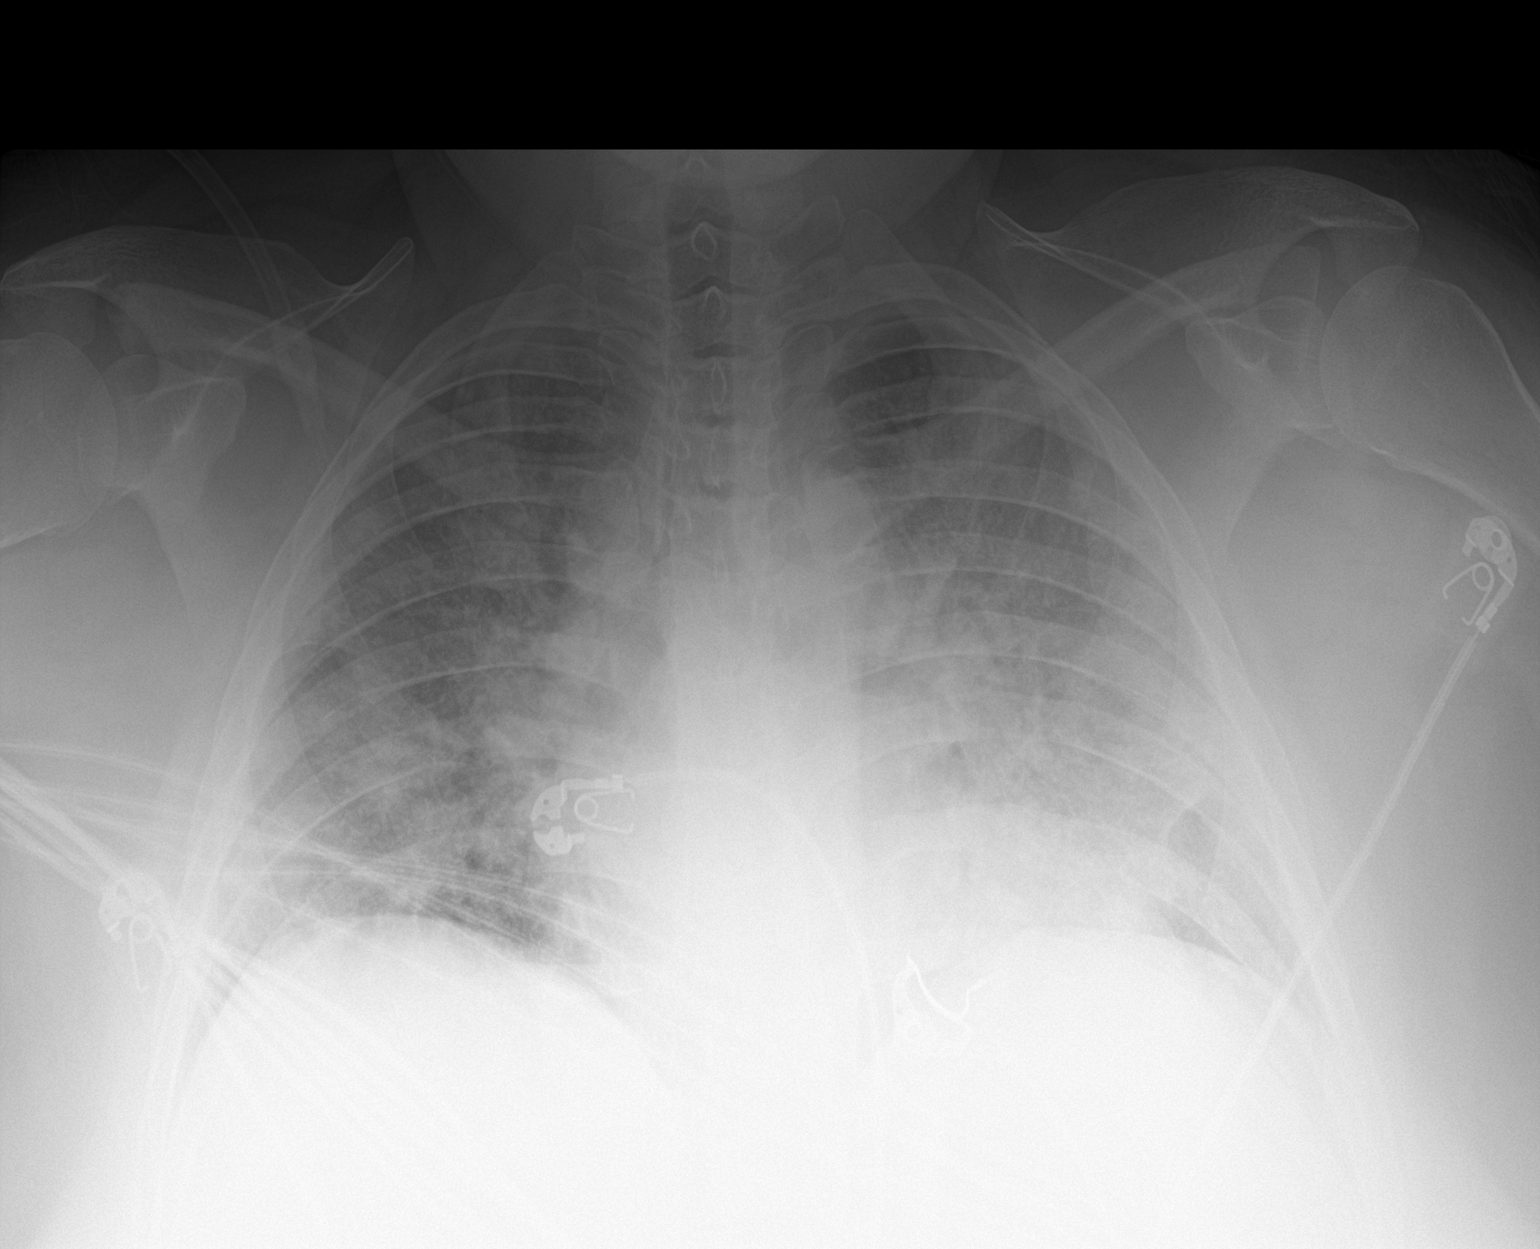

[1 of 1 positions shown; findings below may reference images not displayed]

FINDINGS: Moderate severity diffuse bilateral infiltrates are seen. This is
increased in severity when compared to the prior study. There is no
evidence of a pleural effusion or pneumothorax. The heart size and
mediastinal contours are within normal limits. The visualized
skeletal structures are unremarkable.
IMPRESSION: 1. Moderate severity diffuse bilateral infiltrates, increased in
severity when compared to the prior chest plain film dated [DATE]

## 2021-03-11 IMAGING — DX DG CHEST 1V PORT
1 series · 1 of 1 positions shown · non-contrast
Comparison: May 03, 2019.

CLINICAL DATA: Acute respiratory failure due to severe acute
respiratory syndrome coronavirus 2.

EXAM:
PORTABLE CHEST 1 VIEW

[chest]
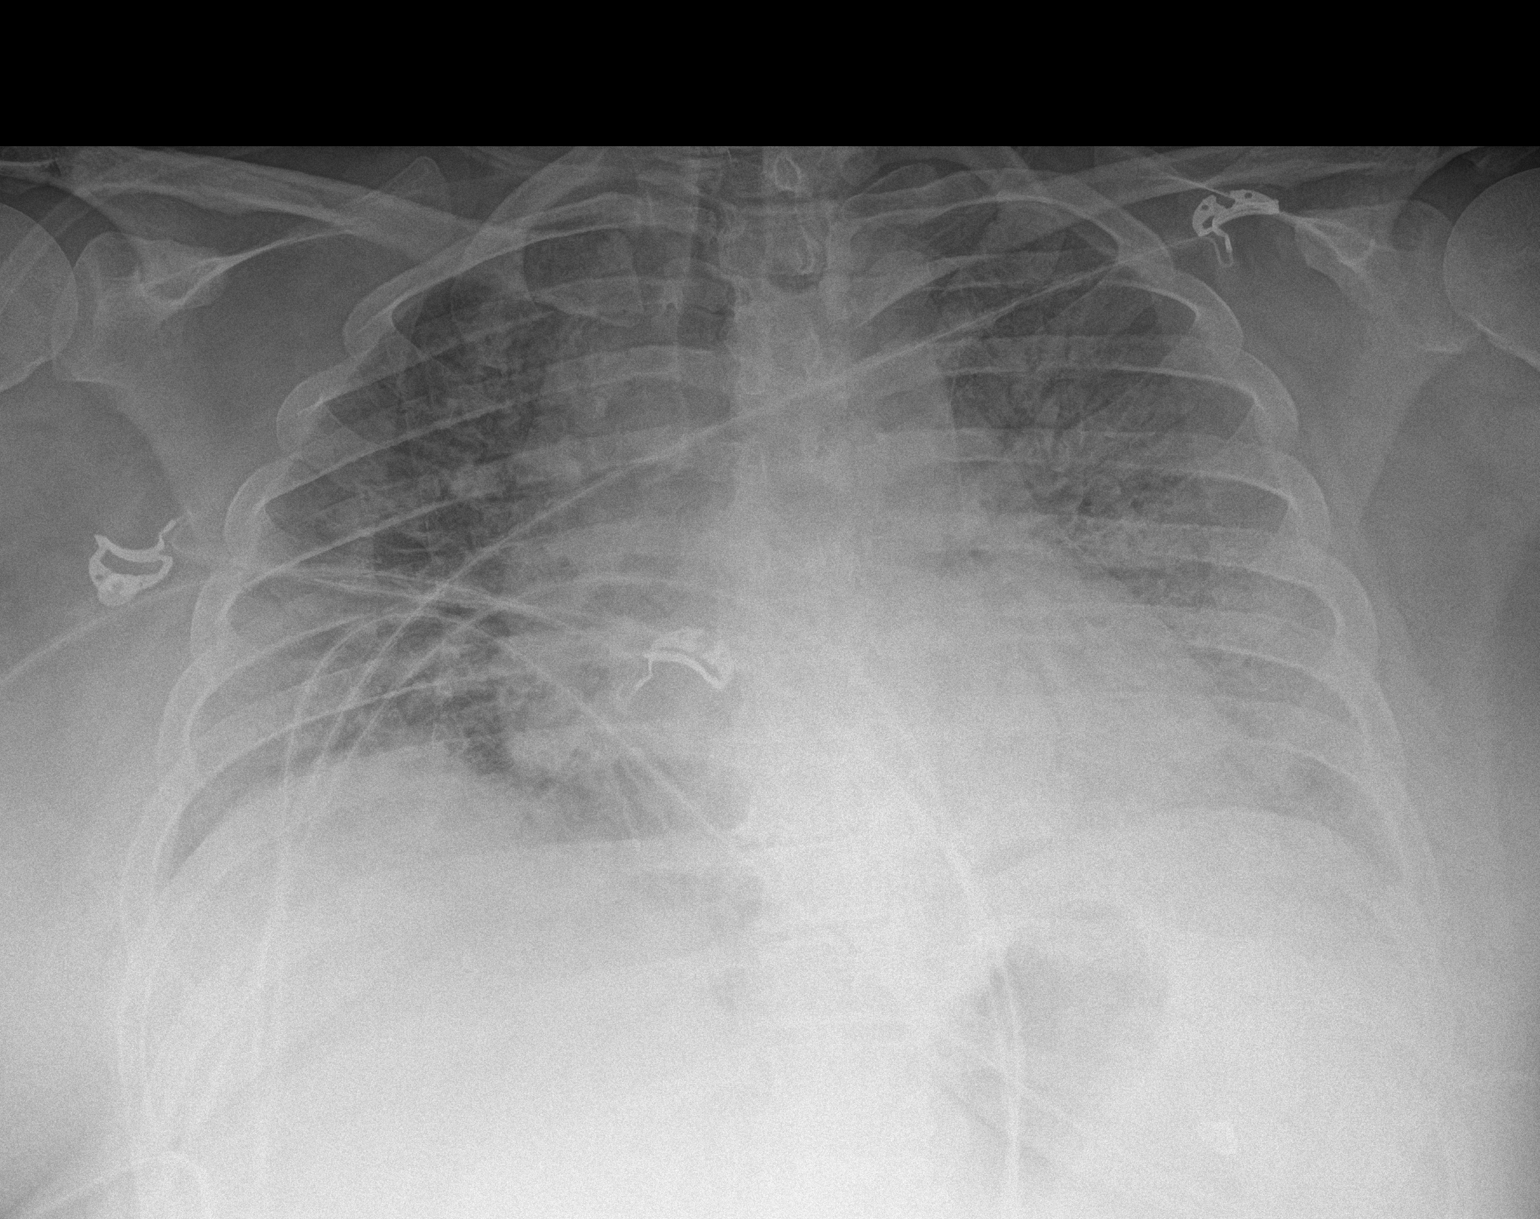

[1 of 1 positions shown; findings below may reference images not displayed]

FINDINGS: Stable cardiomediastinal silhouette. No pneumothorax pleural
effusion is noted. Stable bilateral diffuse lung opacities are noted
concerning for pneumonia. Bony thorax is unremarkable.
IMPRESSION: Stable bilateral diffuse lung opacities are noted consistent with
multifocal pneumonia.

## 2021-03-12 IMAGING — DX DG CHEST 1V PORT
1 series · 1 of 1 positions shown · non-contrast
Comparison: May 04, 2019

CLINICAL DATA: Status post intubation.

EXAM:
PORTABLE CHEST 1 VIEW

[chest ap]
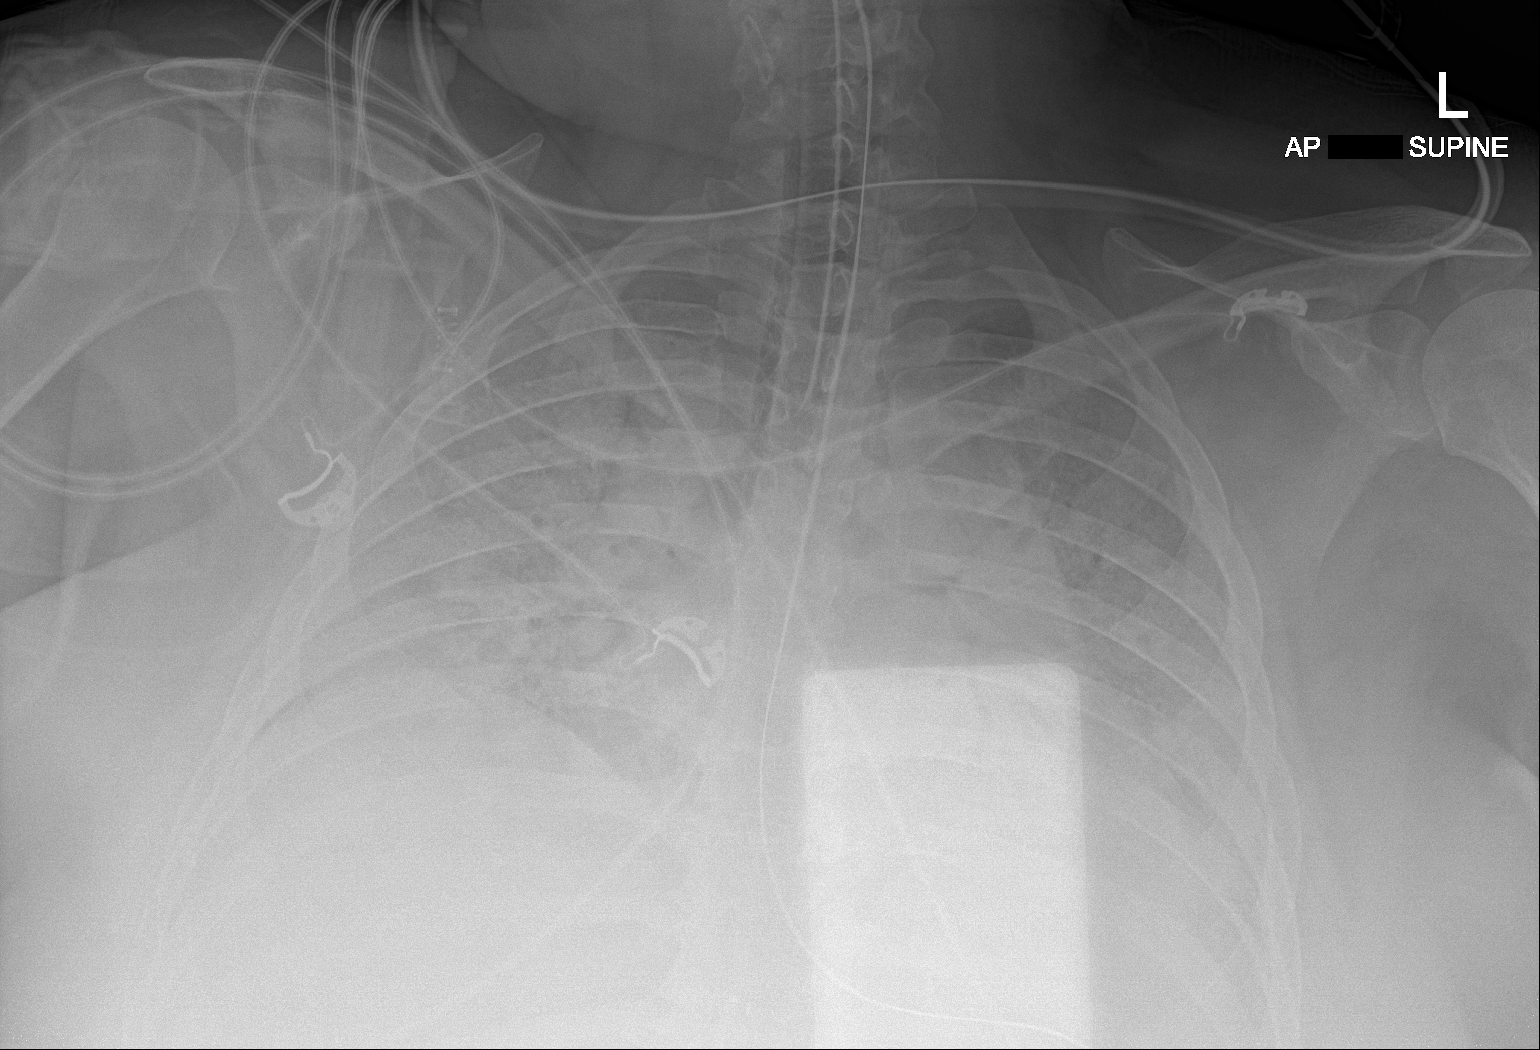

[1 of 1 positions shown; findings below may reference images not displayed]

FINDINGS: An endotracheal tube is seen with its distal tip approximately
cm from the carina. This represents a new finding when compared to
the prior study.

Interval nasogastric tube placement is seen with its distal end
overlying the body of the stomach.

Marked severity diffuse bilateral infiltrates are seen.

There is no evidence of a pleural effusion or pneumothorax.

The cardiac silhouette is mildly enlarged.

The visualized skeletal structures are unremarkable.
IMPRESSION: 1. Interval endotracheal tube and nasogastric tube placement
positioning, as described above, when compared to the prior study
dated May 04, 2019.

## 2021-03-13 IMAGING — DX DG CHEST 1V PORT
1 series · 1 of 1 positions shown · non-contrast
Comparison: Chest radiograph dated 05/06/2019.

CLINICAL DATA: 43-year-old female with central line placement.

EXAM:
PORTABLE CHEST 1 VIEW

[chest ap]
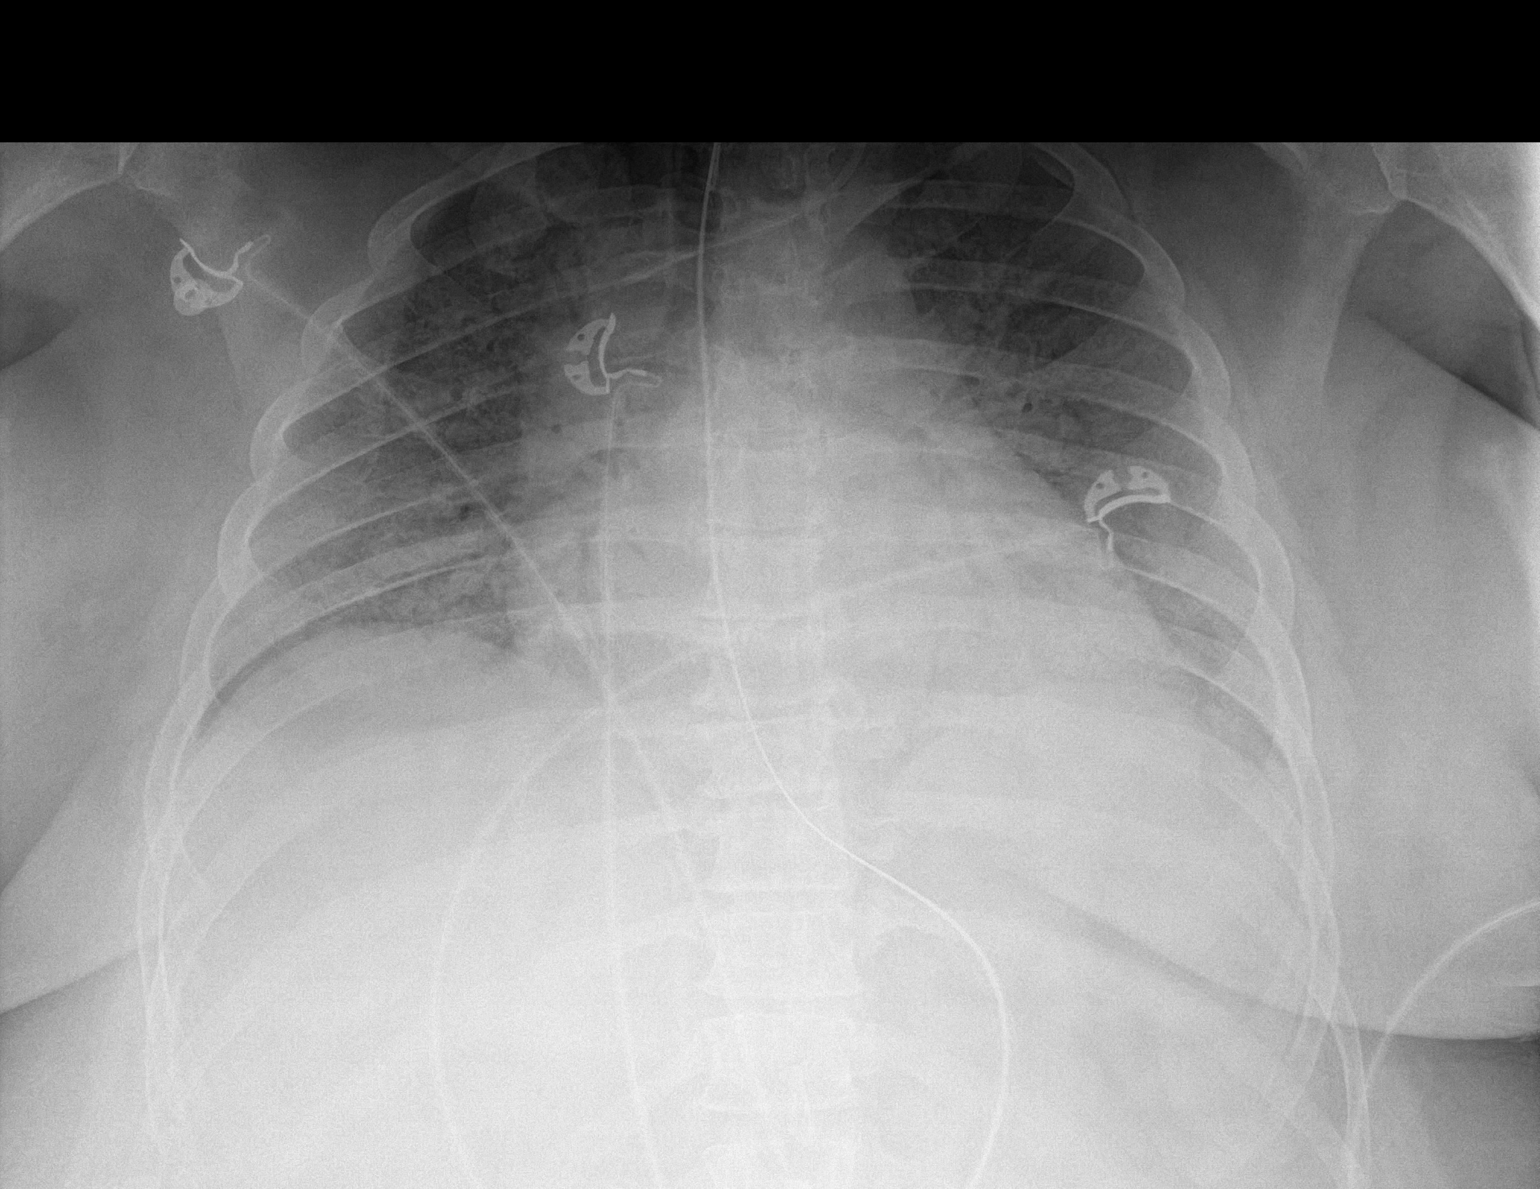

[1 of 1 positions shown; findings below may reference images not displayed]

FINDINGS: Endotracheal tube remains above the carina and enteric tube extends
below the diaphragm with tip beyond the lower margin of the image.
Interval placement of a left IJ central venous line with tip over
the upper SVC. No pneumothorax. Bilateral airspace opacities similar
or slightly worsened since the earlier radiograph. No large pleural
effusion. Stable cardiomediastinal silhouette. No acute osseous
pathology.
IMPRESSION: 1. Interval placement of a left IJ central venous line with tip over
upper SVC. No pneumothorax.
2. Diffuse bilateral airspace opacities similar or slightly
worsened.

## 2024-02-15 ENCOUNTER — Other Ambulatory Visit (HOSPITAL_BASED_OUTPATIENT_CLINIC_OR_DEPARTMENT_OTHER): Payer: Self-pay
